# Patient Record
Sex: Female | Born: 1955
Health system: Southern US, Community
[De-identification: ages and names within clinical notes are randomized; demographics above are authoritative.]

## PROBLEM LIST (undated history)

## (undated) DIAGNOSIS — D721 Eosinophilia: Principal | ICD-10-CM

## (undated) DIAGNOSIS — I10 Essential (primary) hypertension: Secondary | ICD-10-CM

## (undated) DIAGNOSIS — T7840XA Allergy, unspecified, initial encounter: Secondary | ICD-10-CM

## (undated) DIAGNOSIS — K635 Polyp of colon: Secondary | ICD-10-CM

## (undated) DIAGNOSIS — E7800 Pure hypercholesterolemia, unspecified: Secondary | ICD-10-CM

## (undated) DIAGNOSIS — Z923 Personal history of irradiation: Secondary | ICD-10-CM

## (undated) DIAGNOSIS — E119 Type 2 diabetes mellitus without complications: Secondary | ICD-10-CM

## (undated) DIAGNOSIS — J301 Allergic rhinitis due to pollen: Secondary | ICD-10-CM

## (undated) DIAGNOSIS — G4730 Sleep apnea, unspecified: Secondary | ICD-10-CM

## (undated) DIAGNOSIS — E785 Hyperlipidemia, unspecified: Secondary | ICD-10-CM

## (undated) HISTORY — DX: Essential (primary) hypertension: I10

## (undated) HISTORY — DX: Personal history of irradiation: Z92.3

## (undated) HISTORY — DX: Pure hypercholesterolemia, unspecified: E78.00

## (undated) HISTORY — DX: Hyperlipidemia, unspecified: E78.5

## (undated) HISTORY — PX: TUBAL LIGATION: SHX77

## (undated) HISTORY — DX: Polyp of colon: K63.5

## (undated) HISTORY — DX: Type 2 diabetes mellitus without complications: E11.9

## (undated) HISTORY — DX: Allergic rhinitis due to pollen: J30.1

## (undated) HISTORY — DX: Eosinophilia: D72.1

## (undated) HISTORY — DX: Allergy, unspecified, initial encounter: T78.40XA

---

## 1997-06-12 ENCOUNTER — Ambulatory Visit (HOSPITAL_COMMUNITY): Admission: RE | Admit: 1997-06-12 | Discharge: 1997-06-12 | Payer: Self-pay | Admitting: Obstetrics and Gynecology

## 2000-11-06 ENCOUNTER — Emergency Department (HOSPITAL_COMMUNITY): Admission: EM | Admit: 2000-11-06 | Discharge: 2000-11-06 | Payer: Self-pay | Admitting: Internal Medicine

## 2007-10-17 ENCOUNTER — Emergency Department (HOSPITAL_COMMUNITY): Admission: EM | Admit: 2007-10-17 | Discharge: 2007-10-17 | Payer: Self-pay | Admitting: Emergency Medicine

## 2010-03-16 HISTORY — PX: COLONOSCOPY: SHX174

## 2012-01-11 ENCOUNTER — Ambulatory Visit (INDEPENDENT_AMBULATORY_CARE_PROVIDER_SITE_OTHER): Payer: BC Managed Care – PPO | Admitting: Family Medicine

## 2012-01-11 ENCOUNTER — Encounter: Payer: Self-pay | Admitting: Family Medicine

## 2012-01-11 VITALS — BP 124/80 | HR 64 | Temp 98.6°F | Ht 59.75 in | Wt 166.2 lb

## 2012-01-11 DIAGNOSIS — R635 Abnormal weight gain: Secondary | ICD-10-CM

## 2012-01-11 DIAGNOSIS — I10 Essential (primary) hypertension: Secondary | ICD-10-CM

## 2012-01-11 DIAGNOSIS — Z79899 Other long term (current) drug therapy: Secondary | ICD-10-CM

## 2012-01-11 DIAGNOSIS — E7800 Pure hypercholesterolemia, unspecified: Secondary | ICD-10-CM

## 2012-01-11 DIAGNOSIS — E785 Hyperlipidemia, unspecified: Secondary | ICD-10-CM

## 2012-01-11 MED ORDER — PRAVASTATIN SODIUM 40 MG PO TABS: 40.0000 mg | ORAL_TABLET | Freq: Every day | ORAL | Status: DC

## 2012-01-11 NOTE — Progress Notes (Signed)
Nature conservation officer at [facility] [address] [place] [address] Phone: [phone] Fax: [phone]  Date:  01/11/2012   Name:  Natalie Rice   DOB:  1955-06-20   MRN:  [id number] Gender: female Age: 56 y.o.  PCP:  Hannah Beat, MD  Evaluating MD: Hannah Beat, MD   Chief Complaint: Establish Care   History of Present Illness:  Natalie Rice is a 56 y.o. pleasant patient who presents with the following:  HTN: started on blood pressure medication last year, has been stable on Benicar HCT.  Last march, cholesterol was high, and she was taking some pravachol. She did discontinue this her self, when she had a cholesterol screening at work it was total of 323. She had no side effects from Pravachol.   Last month, had a screening at work, 323.   Sometimes thigh will feel like going to sleep.   Left-sided plantar fasciitis intermittently for one year. She is actually aren't seen podiatry, she has caused more ptotic, she has been doing some rehabilitation, and she also has had 2  Plantar fascia injections.  Patient Active Problem List  Diagnosis  . High blood pressure  . High cholesterol    Past Medical History  Diagnosis Date  . Hay fever   . High blood pressure   . High cholesterol   . Colon polyp     No past surgical history on file.  History  Substance Use Topics  . Smoking status: Former Games developer  . Smokeless tobacco: Never Used   Comment: quit 2007  . Alcohol Use: Yes     occassionally    No family history on file.  No Known Allergies  Current Outpatient Prescriptions on File Prior to Visit  Medication Sig Dispense Refill  . Loratadine (CLARITIN) 10 MG CAPS Take by mouth daily as needed.      Marland Kitchen olmesartan-hydrochlorothiazide (BENICAR HCT) 20-12.5 MG per tablet Take 1 tablet by mouth daily.      . pravastatin (PRAVACHOL) 40 MG tablet Take 1 tablet (40 mg total) by mouth daily.  30 tablet  3     Review of Systems:   GEN: No acute  illnesses, no fevers, chills. GI: No n/v/d, eating normally Pulm: No SOB Interactive and getting along well at home.  Otherwise, ROS is as per the HPI.   Physical Examination: Filed Vitals:   01/11/12 1401  BP: 124/80  Pulse: 64  Temp: 98.6 F (37 C)  TempSrc: Oral  Height: 4' 11.75" (1.518 m)  Weight: 166 lb 4 oz (75.411 kg)    Body mass index is 32.74 kg/(m^2). Ideal Body Weight: Weight in (lb) to have BMI = 25: 126.7    GEN: WDWN, NAD, Non-toxic, Alert & Oriented x 3 HEENT: Atraumatic, Normocephalic.  CV: regular rate and rhythm, no murmurs gallops or rubs. Pulmonary clear throughout bilaterally. Ears and Nose: No external deformity. EXTR: No clubbing/cyanosis/edema NEURO: Normal gait.  PSYCH: Normally interactive. Conversant. Not depressed or anxious appearing.  Calm demeanor.   Foot exam Echymosis: no Edema: no ROM: full LE B Gait: heel toe, non-antalgic MT pain: no Callus pattern: none Lateral Mall: NT Medial Mall: NT Talus: NT Navicular: NT Calcaneous: NT Metatarsals: NT 5th MT: NT Phalanges: NT Achilles: NT Plantar Fascia: tender, medial along PF. Pain with forced dorsi (LEFT) Fat Pad: NT Peroneals: NT Post Tib: NT Great Toe: Nml motion Ant Drawer: neg Other foot breakdown: none Long arch: preserved Transverse arch: preserved Hindfoot breakdown: none  Sensation: intact   Assessment and Plan:  1. Other and unspecified hyperlipidemia  Lipid panel  2. Hypertension    3. Encounter for long-term (current) use of other medications  CBC with Differential, Basic metabolic panel  4. Weight gain  TSH  5. High blood pressure    6. High cholesterol     New patient exam. Start Pravachol and recheck lipid panel in 2 months.  F/u CPX in 2 mo  Orders Today:  Orders Placed This Encounter  Procedures  . CBC with Differential    Standing Status: Future     Number of Occurrences:      Standing Expiration Date: 01/10/2013  . Basic metabolic panel     Standing Status: Future     Number of Occurrences:      Standing Expiration Date: 01/10/2013  . Lipid panel    Standing Status: Future     Number of Occurrences:      Standing Expiration Date: 01/10/2013  . TSH    Standing Status: Future     Number of Occurrences:      Standing Expiration Date: 01/10/2013    Updated Medication List: (Includes new medications, updates to list, dose adjustments) Meds ordered this encounter  Medications  . olmesartan-hydrochlorothiazide (BENICAR HCT) 20-12.5 MG per tablet    Sig: Take 1 tablet by mouth daily.  . Loratadine (CLARITIN) 10 MG CAPS    Sig: Take by mouth daily as needed.  Marland Kitchen aspirin 81 MG tablet    Sig: Take 81 mg by mouth daily as needed.  . pravastatin (PRAVACHOL) 40 MG tablet    Sig: Take 1 tablet (40 mg total) by mouth daily.    Dispense:  30 tablet    Refill:  3    Medications Discontinued: There are no discontinued medications.   Hannah Beat, MD

## 2012-01-11 NOTE — Patient Instructions (Addendum)
F/u 2 months for full CPX  Labs 1 week before

## 2012-01-12 ENCOUNTER — Encounter: Payer: Self-pay | Admitting: Family Medicine

## 2012-01-12 DIAGNOSIS — I152 Hypertension secondary to endocrine disorders: Secondary | ICD-10-CM | POA: Insufficient documentation

## 2012-01-12 DIAGNOSIS — I10 Essential (primary) hypertension: Secondary | ICD-10-CM | POA: Insufficient documentation

## 2012-01-12 DIAGNOSIS — E7800 Pure hypercholesterolemia, unspecified: Secondary | ICD-10-CM | POA: Insufficient documentation

## 2012-01-12 DIAGNOSIS — E785 Hyperlipidemia, unspecified: Secondary | ICD-10-CM | POA: Insufficient documentation

## 2012-01-12 DIAGNOSIS — E1169 Type 2 diabetes mellitus with other specified complication: Secondary | ICD-10-CM | POA: Insufficient documentation

## 2012-01-12 DIAGNOSIS — E1159 Type 2 diabetes mellitus with other circulatory complications: Secondary | ICD-10-CM | POA: Insufficient documentation

## 2012-01-29 ENCOUNTER — Other Ambulatory Visit: Payer: Self-pay | Admitting: *Deleted

## 2012-01-29 MED ORDER — PRAVASTATIN SODIUM 40 MG PO TABS: 40.0000 mg | ORAL_TABLET | Freq: Every day | ORAL | Status: DC

## 2012-01-29 MED ORDER — OLMESARTAN MEDOXOMIL-HCTZ 20-12.5 MG PO TABS: 1.0000 | ORAL_TABLET | Freq: Every day | ORAL | Status: DC

## 2012-02-22 ENCOUNTER — Other Ambulatory Visit: Payer: BC Managed Care – PPO

## 2012-02-25 ENCOUNTER — Other Ambulatory Visit (INDEPENDENT_AMBULATORY_CARE_PROVIDER_SITE_OTHER): Payer: BC Managed Care – PPO

## 2012-02-25 DIAGNOSIS — Z79899 Other long term (current) drug therapy: Secondary | ICD-10-CM

## 2012-02-25 DIAGNOSIS — E785 Hyperlipidemia, unspecified: Secondary | ICD-10-CM

## 2012-02-25 DIAGNOSIS — R635 Abnormal weight gain: Secondary | ICD-10-CM

## 2012-02-25 LAB — BASIC METABOLIC PANEL
BUN: 13 mg/dL (ref 6–23)
CO2: 28 mEq/L (ref 19–32)
Calcium: 9.3 mg/dL (ref 8.4–10.5)
Chloride: 101 mEq/L (ref 96–112)
Creatinine, Ser: 1 mg/dL (ref 0.4–1.2)
GFR: 73.87 mL/min (ref >60.00)
Glucose, Bld: 95 mg/dL (ref 70–99)
Potassium: 3.6 mEq/L (ref 3.5–5.1)
Sodium: 135 mEq/L (ref 135–145)

## 2012-02-25 LAB — CBC WITH DIFFERENTIAL/PLATELET
Basophils Absolute: 0.1 10*3/uL (ref 0.0–0.1)
Basophils Relative: 0.5 % (ref 0.0–3.0)
Eosinophils Absolute: 3.6 10*3/uL — ABNORMAL HIGH (ref 0.0–0.7)
Eosinophils Relative: 29.2 % — ABNORMAL HIGH (ref 0.0–5.0)
HCT: 44.1 % (ref 36.0–46.0)
Hemoglobin: 14.2 g/dL (ref 12.0–15.0)
Lymphocytes Relative: 19.8 % (ref 12.0–46.0)
Lymphs Abs: 2.4 10*3/uL (ref 0.7–4.0)
MCHC: 32.2 g/dL (ref 30.0–36.0)
MCV: 87.3 fl (ref 78.0–100.0)
Monocytes Absolute: 0.8 10*3/uL (ref 0.1–1.0)
Monocytes Relative: 6.8 % (ref 3.0–12.0)
Neutro Abs: 5.3 10*3/uL (ref 1.4–7.7)
Neutrophils Relative %: 43.7 % (ref 43.0–77.0)
Platelets: 295 10*3/uL (ref 150.0–400.0)
RBC: 5.05 Mil/uL (ref 3.87–5.11)
RDW: 13.6 % (ref 11.5–14.6)
WBC: 12.2 10*3/uL — ABNORMAL HIGH (ref 4.5–10.5)

## 2012-02-25 LAB — LIPID PANEL
Cholesterol: 193 mg/dL (ref 0–200)
HDL: 50.7 mg/dL (ref >39.00)
LDL Cholesterol: 126 mg/dL — ABNORMAL HIGH (ref 0–99)
Total CHOL/HDL Ratio: 4
Triglycerides: 82 mg/dL (ref 0.0–149.0)
VLDL: 16.4 mg/dL (ref 0.0–40.0)

## 2012-02-25 LAB — TSH: TSH: 1.9 u[IU]/mL (ref 0.35–5.50)

## 2012-02-29 ENCOUNTER — Ambulatory Visit (INDEPENDENT_AMBULATORY_CARE_PROVIDER_SITE_OTHER): Payer: BC Managed Care – PPO | Admitting: Family Medicine

## 2012-02-29 ENCOUNTER — Encounter: Payer: Self-pay | Admitting: Family Medicine

## 2012-02-29 VITALS — BP 130/70 | HR 78 | Temp 99.0°F | Ht 59.75 in | Wt 167.5 lb

## 2012-02-29 DIAGNOSIS — Z0000 Encounter for general adult medical examination without abnormal findings: Secondary | ICD-10-CM

## 2012-02-29 DIAGNOSIS — D721 Eosinophilia, unspecified: Secondary | ICD-10-CM

## 2012-02-29 DIAGNOSIS — Z01419 Encounter for gynecological examination (general) (routine) without abnormal findings: Secondary | ICD-10-CM

## 2012-02-29 NOTE — Progress Notes (Signed)
Nature conservation officer at [facility] [address] [place] [address] Phone: [phone] Fax: [phone]  Date:  02/29/2012   Name:  Natalie Rice   DOB:  13-Apr-1955   MRN:  [id number] Gender: female Age: 56 y.o.  PCP:  Hannah Beat, MD  Evaluating MD: Hannah Beat, MD   Chief Complaint: Annual Exam   History of Present Illness:  Natalie Rice is a 56 y.o. pleasant patient who presents with the following:  CPX:  Pap - last year, everything done last year. Can move to every 2 years.  Mammo - had one last year. [name].  Colon - 2012, everything was ok. 10 year recall.   HTN, stable  Lipids, stable Much improved - tol pravachol.  Health Maintenance Summary Reviewed and updated, unless pt declines services.  Tobacco History Reviewed. Non-smoker Alcohol: No concerns, no excessive use Exercise Habits: Some activity, rec at least 30 mins 5 times a week STD concerns: none Drug Use: None Birth control method: s/p menst Lumps or breast concerns: no Breast Cancer Family History: no  Health Maintenance  Topic Date Due  . Tetanus/tdap  07/26/1975  . Influenza Vaccine  11/14/2012  . Mammogram  02/28/2013  . Pap Smear  02/28/2013  . Colonoscopy  02/28/2021    Labs reviewed with the patient.  Results for orders placed in visit on 02/25/12  CBC WITH DIFFERENTIAL      Component Value Range   WBC 12.2 (*) 4.5 - 10.5 K/uL   RBC 5.05  3.87 - 5.11 Mil/uL   Hemoglobin 14.2  12.0 - 15.0 g/dL   HCT 96.2  95.2 - 84.1 %   MCV 87.3  78.0 - 100.0 fl   MCHC 32.2  30.0 - 36.0 g/dL   RDW 32.4  40.1 - 02.7 %   Platelets 295.0  150.0 - 400.0 K/uL   Neutrophils Relative    43.0 - 77.0 %   Value: 43.7 Manual smear review agrees with instrument differential.   Lymphocytes Relative 19.8  12.0 - 46.0 %   Monocytes Relative 6.8  3.0 - 12.0 %   Eosinophils Relative 29.2 Repeated and verified X2. (*) 0.0 - 5.0 %   Basophils Relative 0.5  0.0 - 3.0 %   Neutro  Abs 5.3  1.4 - 7.7 K/uL   Lymphs Abs 2.4  0.7 - 4.0 K/uL   Monocytes Absolute 0.8  0.1 - 1.0 K/uL   Eosinophils Absolute 3.6 (*) 0.0 - 0.7 K/uL   Basophils Absolute 0.1  0.0 - 0.1 K/uL  BASIC METABOLIC PANEL      Component Value Range   Sodium 135  135 - 145 mEq/L   Potassium 3.6  3.5 - 5.1 mEq/L   Chloride 101  96 - 112 mEq/L   CO2 28  19 - 32 mEq/L   Glucose, Bld 95  70 - 99 mg/dL   BUN 13  6 - 23 mg/dL   Creatinine, Ser 1.0  0.4 - 1.2 mg/dL   Calcium 9.3  8.4 - 25.3 mg/dL   GFR 66.44  >03.47 mL/min  LIPID PANEL      Component Value Range   Cholesterol 193  0 - 200 mg/dL   Triglycerides 42.5  0.0 - 149.0 mg/dL   HDL 95.63  >87.56 mg/dL   VLDL 43.3  0.0 - 29.5 mg/dL   LDL Cholesterol 188 (*) 0 - 99 mg/dL   Total CHOL/HDL Ratio 4    TSH  Component Value Range   TSH 1.90  0.35 - 5.50 uIU/mL      Patient Active Problem List  Diagnosis  . High blood pressure  . High cholesterol    Past Medical History  Diagnosis Date  . Hay fever   . High blood pressure   . High cholesterol   . Colon polyp     No past surgical history on file.  History  Substance Use Topics  . Smoking status: Former Games developer  . Smokeless tobacco: Never Used     Comment: quit 2007  . Alcohol Use: Yes     Comment: occassionally    No family history on file.  No Known Allergies  Medication list has been reviewed and updated.  Outpatient Prescriptions Prior to Visit  Medication Sig Dispense Refill  . aspirin 81 MG tablet Take 81 mg by mouth daily as needed.      . Loratadine (CLARITIN) 10 MG CAPS Take by mouth daily as needed.      Marland Kitchen olmesartan-hydrochlorothiazide (BENICAR HCT) 20-12.5 MG per tablet Take 1 tablet by mouth daily.  90 tablet  1  . pravastatin (PRAVACHOL) 40 MG tablet Take 1 tablet (40 mg total) by mouth daily.  90 tablet  1   Last reviewed on 02/29/2012  2:30 PM by Consuello Masse, CMA  Review of Systems:   General: Denies fever, chills, sweats. No significant  weight loss. Eyes: Denies blurring,significant itching ENT: Denies earache, sore throat, and hoarseness.  Cardiovascular: Denies chest pains, palpitations, dyspnea on exertion,  Respiratory: Denies cough, dyspnea at rest,wheeezing Breast: no concerns about lumps GI: Denies nausea, vomiting, diarrhea, constipation, change in bowel habits, abdominal pain, melena, hematochezia GU: Denies dysuria, hematuria, urinary hesitancy, nocturia, denies STD risk, no concerns about discharge Musculoskeletal: Denies back pain, joint pain Derm: Denies rash, itching Neuro: Denies  paresthesias, frequent falls, frequent headaches Psych: Denies depression, anxiety Endocrine: Denies cold intolerance, heat intolerance, polydipsia Heme: Denies enlarged lymph nodes Allergy: No hayfever   Physical Examination: Filed Vitals:   02/29/12 1420  BP: 130/70  Pulse: 78  Temp: 99 F (37.2 C)  TempSrc: Oral  Height: 4' 11.75" (1.518 m)  Weight: 167 lb 8 oz (75.978 kg)  SpO2: 97%    Body mass index is 32.99 kg/(m^2). Ideal Body Weight: Weight in (lb) to have BMI = 25: 126.7    Wt Readings from Last 3 Encounters:  02/29/12 167 lb 8 oz (75.978 kg)  01/11/12 166 lb 4 oz (75.411 kg)    GEN: well developed, well nourished, no acute distress Eyes: conjunctiva and lids normal, PERRLA, EOMI ENT: TM clear, nares clear, oral exam WNL Neck: supple, no lymphadenopathy, no thyromegaly, no JVD Pulm: clear to auscultation and percussion, respiratory effort normal CV: regular rate and rhythm, S1-S2, no murmur, rub or gallop, no bruits Chest: no scars, masses, no lumps BREAST: no lumps, no axillary LAD, no nipple discharge GI: soft, non-tender; no hepatosplenomegaly, masses; active bowel sounds all quadrants GU: defer Lymph: no cervical, axillary or inguinal adenopathy MSK: gait normal, muscle tone and strength WNL, no joint swelling, effusions, discoloration, crepitus  SKIN: clear, good turgor, color WNL, no rashes,  lesions, or ulcerations Neuro: normal mental status, normal strength, sensation, and motion Psych: alert; oriented to person, place and time, normally interactive and not anxious or depressed in appearance.   Assessment and Plan:  1. Routine gynecological examination    2. Eosinophilia  Pathologist smear review  3. Routine general medical examination at a  health care facility     Overall, doing well  No clear source for eosinophilia - no travel, no infections. Will check periph smear.  Orders Today:  Orders Placed This Encounter  Procedures  . Pathologist smear review    Updated Medication List: (Includes new medications, updates to list, dose adjustments) No orders of the defined types were placed in this encounter.    Medications Discontinued: There are no discontinued medications.   Hannah Beat, MD

## 2012-03-01 LAB — PATHOLOGIST SMEAR REVIEW

## 2012-03-03 ENCOUNTER — Other Ambulatory Visit: Payer: Self-pay | Admitting: Family Medicine

## 2012-03-03 ENCOUNTER — Encounter: Payer: Self-pay | Admitting: Family Medicine

## 2012-03-03 ENCOUNTER — Ambulatory Visit: Payer: Self-pay | Admitting: Internal Medicine

## 2012-03-03 DIAGNOSIS — D721 Eosinophilia, unspecified: Secondary | ICD-10-CM

## 2012-03-03 DIAGNOSIS — D72829 Elevated white blood cell count, unspecified: Secondary | ICD-10-CM

## 2012-03-03 HISTORY — DX: Eosinophilia, unspecified: D72.10

## 2012-03-03 NOTE — Progress Notes (Signed)
Marked eosinophilia with no travel exposure, no parasite risk, no meds to cause. Consult Heme/Onc -- lymphoma needs to be excluded in this case and full diagnostic work-up, which was fully discussed with the patient.  Pathologist smear review      Status: Final result   MyChart: Not Released   Next appt with me: None   Dx: Eosinophilia          Component  Value     Path Review       Comments:       Absolute eosinophilia the most common associations being allergic disorders, certain parasitic infections and Hodgkin's Disease.  Myeloid population consists predominantly of mature segmented neutrophils.  No immature cells are identified.  RBCs and platelets are unremarkable.   Reviewed by Nehemiah Massed Mammarappallil MD (Electronic Signature on File) 03/01/12    WBC  12.2 (H)  4.5 - 10.5 K/uL  RBC 5.05 3.87 - 5.11 Mil/uL  Hemoglobin 14.2 12.0 - 15.0 g/dL  HCT 46.9 62.9 - 52.8 %  MCV 87.3 78.0 - 100.0 fl  MCHC 32.2 30.0 - 36.0 g/dL  RDW 41.3 24.4 - 01.0 %  Platelets 295.0 150.0 - 400.0 K/uL  Neutrophils Relative 43.7 Manual smear review agrees with instrument differential. 43.0 - 77.0 %  Lymphocytes Relative 19.8 12.0 - 46.0 %  Monocytes Relative 6.8 3.0 - 12.0 %    Eosinophils Relative  29.2 Repeated and verified X2. (H)  Normal: 0.0 - 5.0 %    Basophils Relative 0.5 0.0 - 3.0 %  Neutro Abs 5.3 1.4 - 7.7 K/uL  Lymphs Abs 2.4 0.7 - 4.0 K/uL  Monocytes Absolute 0.8 0.1 - 1.0 K/uL    Eosinophils Absolute  3.6 (H)       Normal: 0.0 - 0.7 K/uL     Basophils Absolute 0.1

## 2012-03-23 ENCOUNTER — Ambulatory Visit: Payer: Self-pay | Admitting: Internal Medicine

## 2012-03-23 LAB — CBC CANCER CENTER
Comment - H1-Com1: NORMAL
Eosinophil: 6 %
HCT: 45.1 % (ref 35.0–47.0)
HGB: 14.9 g/dL (ref 12.0–16.0)
Lymphocytes: 21 %
MCH: 28.4 pg (ref 26.0–34.0)
MCHC: 33.1 g/dL (ref 32.0–36.0)
MCV: 86 fL (ref 80–100)
Monocytes: 4 %
Platelet: 345 x10 3/mm (ref 150–440)
RBC: 5.24 10*6/uL — ABNORMAL HIGH (ref 3.80–5.20)
RDW: 13.6 % (ref 11.5–14.5)
Segmented Neutrophils: 66 %
Variant Lymphocyte: 3 %
WBC: 9.3 x10 3/mm (ref 3.6–11.0)

## 2012-03-23 LAB — SEDIMENTATION RATE: Erythrocyte Sed Rate: 2 mm/hr (ref 0–30)

## 2012-03-23 LAB — URINALYSIS, COMPLETE
Bilirubin,UR: NEGATIVE
Glucose,UR: NEGATIVE mg/dL (ref 0–75)
Ketone: NEGATIVE
Leukocyte Esterase: NEGATIVE
Nitrite: NEGATIVE
Ph: 5 (ref 4.5–8.0)
Protein: NEGATIVE
RBC,UR: 1 /HPF (ref 0–5)
Specific Gravity: 1.006 (ref 1.003–1.030)
Squamous Epithelial: 1
WBC UR: 1 /HPF (ref 0–5)

## 2012-03-23 LAB — HEPATIC FUNCTION PANEL A (ARMC)
Albumin: 4.2 g/dL (ref 3.4–5.0)
Alkaline Phosphatase: 91 U/L (ref 50–136)
Bilirubin, Direct: 0.1 mg/dL (ref 0.00–0.20)
Bilirubin,Total: 0.4 mg/dL (ref 0.2–1.0)
SGOT(AST): 18 U/L (ref 15–37)
SGPT (ALT): 36 U/L (ref 12–78)
Total Protein: 8.3 g/dL — ABNORMAL HIGH (ref 6.4–8.2)

## 2012-03-23 LAB — FOLATE: Folic Acid: 18.3 ng/mL (ref 3.1–100.0)

## 2012-03-23 LAB — TSH: Thyroid Stimulating Horm: 2.32 u[IU]/mL

## 2012-03-23 LAB — IRON AND TIBC
Iron Bind.Cap.(Total): 333 ug/dL (ref 250–450)
Iron Saturation: 25 %
Iron: 84 ug/dL (ref 50–170)
Unbound Iron-Bind.Cap.: 249 ug/dL

## 2012-03-23 LAB — CREATININE, SERUM
Creatinine: 1.02 mg/dL (ref 0.60–1.30)
EGFR (African American): >60
EGFR (Non-African Amer.): >60

## 2012-03-24 LAB — PROT IMMUNOELECTROPHORES(ARMC)

## 2012-03-24 LAB — URINE CULTURE

## 2012-04-13 LAB — CBC CANCER CENTER
Basophil #: 0.1 x10 3/mm (ref 0.0–0.1)
Basophil %: 0.9 %
Eosinophil #: 0.3 x10 3/mm (ref 0.0–0.7)
Eosinophil %: 3.7 %
HCT: 43.4 % (ref 35.0–47.0)
HGB: 14.4 g/dL (ref 12.0–16.0)
Lymphocyte #: 2.7 x10 3/mm (ref 1.0–3.6)
Lymphocyte %: 29.3 %
MCH: 28.4 pg (ref 26.0–34.0)
MCHC: 33.2 g/dL (ref 32.0–36.0)
MCV: 86 fL (ref 80–100)
Monocyte #: 0.9 x10 3/mm (ref 0.2–0.9)
Monocyte %: 9.8 %
Neutrophil #: 5.2 x10 3/mm (ref 1.4–6.5)
Neutrophil %: 56.3 %
Platelet: 315 x10 3/mm (ref 150–440)
RBC: 5.07 10*6/uL (ref 3.80–5.20)
RDW: 13.5 % (ref 11.5–14.5)
WBC: 9.2 x10 3/mm (ref 3.6–11.0)

## 2012-04-16 ENCOUNTER — Ambulatory Visit: Payer: Self-pay | Admitting: Internal Medicine

## 2012-05-06 ENCOUNTER — Telehealth: Payer: Self-pay

## 2012-05-06 NOTE — Telephone Encounter (Signed)
Pt left v/m; Pt thinks Pravastatin is causing muscles to be sore and stiff; soreness and stiffness began several weeks ago. Pt started taking Pravastatin in Oct 2013. Pt said 1-2 hours after taking Pravastatin is when notices the stiffness and soreness in back, shoulders and arms. No injury noted.[employer].Please advise.

## 2012-05-07 NOTE — Telephone Encounter (Signed)
i would stop it and see if she feels better after a few weeks. If so, then it is probably the pravastatin.

## 2012-05-09 NOTE — Telephone Encounter (Signed)
Patient advised.

## 2012-08-12 ENCOUNTER — Other Ambulatory Visit: Payer: Self-pay | Admitting: Family Medicine

## 2012-09-10 ENCOUNTER — Emergency Department: Payer: Self-pay | Admitting: Emergency Medicine

## 2012-09-10 LAB — BASIC METABOLIC PANEL
Anion Gap: 6 — ABNORMAL LOW (ref 7–16)
BUN: 20 mg/dL — ABNORMAL HIGH (ref 7–18)
Calcium, Total: 9.7 mg/dL (ref 8.5–10.1)
Chloride: 104 mmol/L (ref 98–107)
Co2: 28 mmol/L (ref 21–32)
Creatinine: 0.98 mg/dL (ref 0.60–1.30)
EGFR (African American): >60
EGFR (Non-African Amer.): >60
Glucose: 89 mg/dL (ref 65–99)
Osmolality: 278 (ref 275–301)
Potassium: 3.7 mmol/L (ref 3.5–5.1)
Sodium: 138 mmol/L (ref 136–145)

## 2012-09-10 LAB — CBC WITH DIFFERENTIAL/PLATELET
Basophil #: 0.1 10*3/uL (ref 0.0–0.1)
Basophil %: 0.7 %
Eosinophil #: 0.4 10*3/uL (ref 0.0–0.7)
Eosinophil %: 4.6 %
HCT: 41.2 % (ref 35.0–47.0)
HGB: 13.6 g/dL (ref 12.0–16.0)
Lymphocyte #: 2.4 10*3/uL (ref 1.0–3.6)
Lymphocyte %: 31.8 %
MCH: 27.9 pg (ref 26.0–34.0)
MCHC: 32.9 g/dL (ref 32.0–36.0)
MCV: 85 fL (ref 80–100)
Monocyte #: 0.7 x10 3/mm (ref 0.2–0.9)
Monocyte %: 9.8 %
Neutrophil #: 4 10*3/uL (ref 1.4–6.5)
Neutrophil %: 53.1 %
Platelet: 278 10*3/uL (ref 150–440)
RBC: 4.86 10*6/uL (ref 3.80–5.20)
RDW: 13.6 % (ref 11.5–14.5)
WBC: 7.6 10*3/uL (ref 3.6–11.0)

## 2012-09-14 ENCOUNTER — Emergency Department: Payer: Self-pay | Admitting: Emergency Medicine

## 2012-09-15 ENCOUNTER — Encounter: Payer: Self-pay | Admitting: Family Medicine

## 2012-09-15 ENCOUNTER — Encounter (INDEPENDENT_AMBULATORY_CARE_PROVIDER_SITE_OTHER): Payer: BC Managed Care – PPO

## 2012-09-15 ENCOUNTER — Encounter: Payer: Self-pay | Admitting: *Deleted

## 2012-09-15 ENCOUNTER — Ambulatory Visit (INDEPENDENT_AMBULATORY_CARE_PROVIDER_SITE_OTHER): Payer: BC Managed Care – PPO | Admitting: Family Medicine

## 2012-09-15 VITALS — BP 120/72 | HR 69 | Temp 98.2°F | Ht 59.75 in | Wt 164.5 lb

## 2012-09-15 DIAGNOSIS — I82412 Acute embolism and thrombosis of left femoral vein: Secondary | ICD-10-CM | POA: Insufficient documentation

## 2012-09-15 DIAGNOSIS — M25571 Pain in right ankle and joints of right foot: Secondary | ICD-10-CM | POA: Insufficient documentation

## 2012-09-15 DIAGNOSIS — Z7901 Long term (current) use of anticoagulants: Secondary | ICD-10-CM

## 2012-09-15 DIAGNOSIS — M7989 Other specified soft tissue disorders: Secondary | ICD-10-CM

## 2012-09-15 DIAGNOSIS — M25579 Pain in unspecified ankle and joints of unspecified foot: Secondary | ICD-10-CM

## 2012-09-15 DIAGNOSIS — IMO0002 Reserved for concepts with insufficient information to code with codable children: Secondary | ICD-10-CM | POA: Insufficient documentation

## 2012-09-15 DIAGNOSIS — Z87448 Personal history of other diseases of urinary system: Secondary | ICD-10-CM

## 2012-09-15 DIAGNOSIS — I824Y9 Acute embolism and thrombosis of unspecified deep veins of unspecified proximal lower extremity: Secondary | ICD-10-CM

## 2012-09-15 DIAGNOSIS — M79609 Pain in unspecified limb: Secondary | ICD-10-CM

## 2012-09-15 DIAGNOSIS — R319 Hematuria, unspecified: Secondary | ICD-10-CM

## 2012-09-15 LAB — POCT URINALYSIS DIPSTICK
Bilirubin, UA: NEGATIVE
Glucose, UA: NEGATIVE
Ketones, UA: NEGATIVE
Leukocytes, UA: NEGATIVE
Nitrite, UA: NEGATIVE
Protein, UA: 3000
Spec Grav, UA: 1.02
Urobilinogen, UA: NEGATIVE
pH, UA: 6

## 2012-09-15 LAB — CBC
HCT: 42.6 % (ref 36.0–46.0)
Hemoglobin: 14 g/dL (ref 12.0–15.0)
MCHC: 32.9 g/dL (ref 30.0–36.0)
MCV: 87.9 fl (ref 78.0–100.0)
Platelets: 336 10*3/uL (ref 150.0–400.0)
RBC: 4.85 Mil/uL (ref 3.87–5.11)
RDW: 13.7 % (ref 11.5–14.6)
WBC: 9.3 10*3/uL (ref 4.5–10.5)

## 2012-09-15 LAB — URIC ACID: Uric Acid, Serum: 7.2 mg/dL — ABNORMAL HIGH (ref 2.4–7.0)

## 2012-09-15 MED ORDER — RIVAROXABAN 20 MG PO TABS: 20.0000 mg | ORAL_TABLET | Freq: Two times a day (BID) | ORAL | Status: DC

## 2012-09-15 MED ORDER — ENOXAPARIN SODIUM 100 MG/ML ~~LOC~~ SOLN: 1.5000 mg/kg | Freq: Every day | SUBCUTANEOUS | Status: DC

## 2012-09-15 MED ORDER — WARFARIN SODIUM 5 MG PO TABS: 5.0000 mg | ORAL_TABLET | Freq: Every day | ORAL | Status: DC

## 2012-09-15 NOTE — Assessment & Plan Note (Signed)
Concerning for second DVT  or possibly gout. Will reassess with dopplers and check uric acid level.

## 2012-09-15 NOTE — Progress Notes (Signed)
Subjective:    Patient ID: Natalie Rice, female    DOB: [birth year], 56 y.o.   MRN: [id number]  HPI  57 year old pt of Dr. Patsy Lager  With history of HTN, high cholesterol presents following recent ER visist for left leg swelling, pain after long trip home from [place], plane flight.  [place] in ER showed DVT in left common  femoral vein.  Nml labs. In ER she was started on xarelto 15 mg x 21 days. No SE to xarelto.   She also returned to ER last night with redness, tenderness, swelling in right ankle.. [place] was normal.  No injury. Red on medial ankle, very tender to touch, warm. No labs done.  Father with history of gout. Ate very rich foods on trip. Given a brace.   Mother with DVT when with cancer at that time age 53. No family history of clotting disease. Nonsmoker. On no OCPs, estrogen.  No SOB, no chest pain, pain and swelling improving on left. New onset this  morning urine reddish/brown.  No dysuria. Gums have been bleeding some off and on.  Review of Systems  Constitutional: Negative for fever, fatigue and unexpected weight change.  HENT: Negative for ear pain, congestion, sore throat, sneezing, trouble swallowing and sinus pressure.   Eyes: Negative for pain and itching.  Respiratory: Negative for cough, shortness of breath and wheezing.   Cardiovascular: Positive for leg swelling. Negative for chest pain and palpitations.  Gastrointestinal: Negative for nausea, abdominal pain, diarrhea, constipation and blood in stool.  Genitourinary: Positive for hematuria. Negative for dysuria, vaginal bleeding, vaginal discharge, difficulty urinating, vaginal pain and menstrual problem.  Skin: Negative for rash.  Neurological: Negative for syncope, weakness, light-headedness, numbness and headaches.  Psychiatric/Behavioral: Negative for confusion and dysphoric mood. The patient is not nervous/anxious.        Objective:   Physical Exam  Constitutional: Vital signs are normal. She  appears well-developed and well-nourished. She is cooperative.  Non-toxic appearance. She does not appear ill. No distress.  HENT:  Head: Normocephalic.  Right Ear: Hearing, tympanic membrane, external ear and ear canal normal.  Left Ear: Hearing, tympanic membrane, external ear and ear canal normal.  Nose: Nose normal.  Eyes: Conjunctivae, EOM and lids are normal. Pupils are equal, round, and reactive to light. No foreign bodies found.  Neck: Trachea normal and normal range of motion. Neck supple. Carotid bruit is not present. No mass and no thyromegaly present.  Cardiovascular: Normal rate, regular rhythm, S1 normal, S2 normal, normal heart sounds and intact distal pulses.  Exam reveals no gallop.   No murmur heard. 1 plus non pitting edema in B ankles  Pulmonary/Chest: Effort normal and breath sounds normal. No respiratory distress. She has no wheezes. She has no rhonchi. She has no rales.  Abdominal: Soft. Normal appearance and bowel sounds are normal. She exhibits no distension, no fluid wave, no abdominal bruit and no mass. There is no hepatosplenomegaly. There is no tenderness. There is no rebound, no guarding and no CVA tenderness. No hernia.  Lymphadenopathy:    She has no cervical adenopathy.    She has no axillary adenopathy.  Neurological: She is alert. She has normal strength. No cranial nerve deficit or sensory deficit.  Skin: Skin is warm, dry and intact. No rash noted.  Right medial ankle red, very tender over malleolus  Psychiatric: Her speech is normal and behavior is normal. Judgment normal. Her mood appears not anxious. Cognition and memory are normal. She  does not exhibit a depressed mood.          Assessment & Plan:

## 2012-09-15 NOTE — Assessment & Plan Note (Addendum)
Discussed with Dr. Welton Flakes Hemetologist.  Stop xarelto.. Short half life. Should be out of pt system in 24 hours.  No method to reverse this medication.  Pt instructed if bleeding worsening to go to ER.  Will wait until tomorrow to initiate lovenox and coumadin.  Total visit time 40 minutes, > 50% spent counseling and cordinating patients care.

## 2012-09-15 NOTE — Patient Instructions (Addendum)
Stop at front desk for re-evaluation of dopplers bilaterally.  Stop xarelto now. Start lovenox injections once daily starting tommorow AM Start coumadin 5 mg daily tommorow AM.  Stop at lab on your way out. Make appt in Saturday clinic at [facility] for evaluation and INR check. Follow up appt with Dr., Wendi Maya next week on Mon or Tuesday for eval and INR check.

## 2012-09-15 NOTE — Assessment & Plan Note (Addendum)
Dr. Welton Flakes hemetologist concerned about skipping initial treatment with lovenox... Recommend reassessment of clot in left leg with dopplers. Will change to lovenox bridge to coumadin.  Given clear cause of clot... Will plans 3 months of anticoagulation, then stop.  Will have pt seen in Saturday clinic for INR/PT and re-eval of B legs for improvement and to make sure bleeding has resolved. Following that she will return early next week to see PCP.

## 2012-09-15 NOTE — Assessment & Plan Note (Addendum)
New onset secondary to xarelto.

## 2012-09-17 ENCOUNTER — Ambulatory Visit (INDEPENDENT_AMBULATORY_CARE_PROVIDER_SITE_OTHER): Payer: BC Managed Care – PPO | Admitting: Family Medicine

## 2012-09-17 ENCOUNTER — Encounter: Payer: Self-pay | Admitting: Family Medicine

## 2012-09-17 VITALS — BP 120/80 | HR 68 | Temp 97.3°F | Wt 165.0 lb

## 2012-09-17 DIAGNOSIS — I824Y9 Acute embolism and thrombosis of unspecified deep veins of unspecified proximal lower extremity: Secondary | ICD-10-CM

## 2012-09-17 DIAGNOSIS — I82412 Acute embolism and thrombosis of left femoral vein: Secondary | ICD-10-CM

## 2012-09-17 LAB — POCT INR: INR: 1.2

## 2012-09-17 NOTE — Progress Notes (Signed)
  Subjective:    Patient ID: Natalie Rice, female    DOB: 1956/01/01, 57 y.o.   MRN: [id number]  HPI Pt here f/u dvt.  Pt was on xaralto and was seen in pcp office Thursday for blood in urine.  Pt states blood stopped day they stopped xaralto .  Pt is now on lovenox and coumadin.  She started it yesterday.  No new complaints.     Review of Systems    as above Objective:   Physical Exam BP 120/80  Pulse 68  Temp(Src) 97.3 F (36.3 C) (Oral)  Wt 165 lb (74.844 kg)  BMI 32.48 kg/m2 General appearance: alert, cooperative, appears stated age and no distress Extremities: extremities normal, atraumatic, no cyanosis or edema       Assessment & Plan:

## 2012-09-17 NOTE — Assessment & Plan Note (Addendum)
Cont' coumadin and lovenox INR 1.2 today F/u Monday

## 2012-09-17 NOTE — Patient Instructions (Signed)
Warfarin, Questions and Answers  Warfarin (Coumadin) is a prescription medicine that delays normal blood clotting. This is called an "anticoagulant" or "blood thinner." This medicine does not actually cause the blood to become less thick, only less likely to clot.  Normally, when body tissues are cut or damaged, the blood clots in order to prevent blood loss. Some clots form when they are not supposed to and may travel through the bloodstream and become lodged in smaller blood vessels. Blockage of blood flow can cause serious problems including a stroke (when a blood vessel leading to a portion of the brain is blocked) or a pulmonary embolus (where a blood clot in the leg travels to and lodges in the pulmonary arteries, causing blockage of blood flow to the lungs).  WHO SHOULD USE WARFARIN?   Warfarin is prescribed for individuals who have a risk for developing harmful blood clots. People at risk include individuals with a mechanical heart valve, individuals with the irregular heart rhythm called atrial fibrillation, and individuals with certain clotting disorders.   Warfarin is also used in individuals who have a history of developing harmful clots, including individuals who have had a stroke, a clot which has traveled to the lung (pulmonary embolism), or a blood clot in the leg (deep venous thrombosis, DVT).   Warfarin may be used to prevent the growth of an existing clot, such as a DVT.  HOW IS THE EFFECT OF WARFARIN MONITORED?  The goal of warfarin therapy is to lessen the clotting tendency of blood, but not to prevent clotting completely. Therefore, the anticoagulation effect of warfarin must be carefully watched using laboratory blood tests.   The test most commonly used to measure the effects of warfarin is the prothrombin time (protime, PT).   The PT is also used to compute a value known as the International Normalized Ratio (INR).    The longer it takes the blood to clot, the higher the PT and INR. Your caregiver will tell you what your "target" INR range is. In most cases, the target range will be 2 to 3, although other ranges may be chosen. If, at any time, your INR is below the target range, there is a risk of clotting. If your INR is above the target range, there is an increased risk of bleeding.  When warfarin is first prescribed, a higher "loading" dose may be given so that an effective blood level of the medicine is reached quickly. The loading dose is then adjusted downward until a maintenance dose (a dose which is enough to keep the INR where it should be) is reached. Once you are on a stable maintenance dose, the PT and INR are watched less often, usually once every 2 to 4 weeks. The warfarin dose may be changed at times in response to a changing INR or to medical changes that call for an increase or decrease in warfarin therapy. It is important to keep all laboratory and caregiver follow-up appointments. Not keeping appointments could result in a chronic or permanent injury, pain, disability, and even death.  WHAT ARE THE SIDE EFFECTS OF WARFARIN?   Excessive blood thinning can cause bleeding (hemorrhage) from any part of the body. Individuals on warfarin should report any falls or accidents, or any symptoms of bleeding or unusual bruising. Signs of unusual bleeding may include bleeding from the gums, blood in the urine, bloody or dark stool, a nosebleed, coughing up blood, or vomiting blood. Because the risk of bleeding increases as the INR   rises above the desired limit, the INR is closely watched during warfarin therapy. Adjustments are made as needed to keep the INR at an acceptable level (within the target range).   Too little warfarin continues to allow the risk for blood clots.    Other body systems can be affected by warfarin. In addition to any signs of bleeding, patients should report skin rash or irritation, unusual fever, continual nausea or stomach upset, severe pain in the joints or back, swelling or pain at an injection site, new and severe headaches, or symptoms of a stroke (new weaknesses, change in mental status, visual changes, new dizziness, trouble speaking).   IS WARFARIN SAFE TO USE DURING PREGNANCY?   Warfarin is generally not advised during pregnancy, at least during the first trimester, due to an increased risk of birth defects. A woman who becomes pregnant or plans to become pregnant while on warfarin therapy should notify the caregiver immediately.   Although warfarin does not pass into breast milk, a woman who wishes to breastfeed while taking warfarin should also consult with her caregiver.  ARE THERE SPECIAL PRECAUTIONS TO FOLLOW WHILE TAKING WARFARIN?  Follow the dosage schedule carefully. Warfarin should be taken exactly as directed. A missed or extra dose requires a call to the prescribing caregiver for advice. Do not change the dose of warfarin on your own to make up for missed or extra doses. It is very important to take warfarin as directed since bleeding or blood clots could result in chronic or permanent injury, pain, or disability.  Warfarin tablets come in different strengths. Each tablet strength is usually a different color, with the amount of warfarin (in milligrams) clearly printed on the tablet. You should immediately report to the pharmacist or caregiver any prescription which is different than the previous one, to make sure that you are taking the correct dose.  Avoid situations that cause bleeding. You may have a tendency to bleed more easily than usual while taking warfarin. The following changes can limit bleeding:   Using a softer toothbrush.   Flossing with waxed floss rather than unwaxed floss.    Shaving with an electric razor rather than a blade.   Limiting the use of sharp objects.   Avoiding potentially harmful activities (such as contact sports).  Medical conditions. Medical conditions which increase your clotting time include:   Diarrhea.   Worsening of heart failure.   Fever.   Worsening of liver function.  Alcohol use. Alcohol can change the body's ability to handle warfarin. It is best to avoid alcoholic drinks or consume only very small amounts while taking warfarin. Notify your caregiver if you change your alcohol intake.  Other medicines. Many medicines can interfere with warfarin and affect the PT and INR results. Some of the most common over-the-counter pain relievers (acetaminophen, aspirin, and nonsteroidal anti-inflammatory medicines) make the anticoagulant effects of warfarin stronger. If these medicines are taken, your caregiver may need to adjust the dose of warfarin. The use of many antibiotics can increase the effects of warfarin, and therefore increase bleeding risk. Do not take or discontinue any prescribed or over-the-counter medicine except on the advice of your caregiver or pharmacist.   Dietary considerations. Do not drink herbal teas containing coumarin while taking this medicine. Many foods, especially foods high in vitamin K can interfere with warfarin and affect the PT and INR results. Foods high in vitamin K can cause warfarin to be less effective. You should eat a consistent amount of foods high   in vitamin K. The serving size for foods high in vitamin K are  cup cooked and 1 cup raw, unless otherwise noted. These foods include:   Beef liver (3.5 ounces).   Garbanzo beans.   Kale.   Spinach.   Nettle greens.   Swiss chard.   Pork liver (3.5 ounces).   Broccoli.   Cabbage.   Watercress.   Soybeans.   Endive.   Green tea (made with  ounce dried tea).   Brussels sprouts.   Cauliflower.   Parsley (1 Tablespoon).   Collard greens.    Seaweed (limit 2 sheets).   Turnip greens.   Soybean oil.   Green peas.  It is not necessary to avoid these foods, but avoid major changes in your diet, or notify your caregiver before changing your diet. Changing your diet to include less foods containing vitamin K may lead to an excessive warfarin effect. Arrange a visit with a dietitian to answer your questions.   IS IT SAFE TO USE SUPPLEMENTS WHILE TAKING WARFARIN?   Vitamin E. Vitamin E may increase the anticoagulant effects of warfarin. You should consult your caregiver before adding or changing a dose of vitamin E or any other vitamin.   Check other supplements such as multivitamins and calcium supplements. These may contain vitamin K.  Document Released: 03/02/2005 Document Revised: 11/25/2011 Document Reviewed: 08/11/2011  [employer] Patient Information 2014 [employer].

## 2012-09-19 ENCOUNTER — Encounter: Payer: Self-pay | Admitting: Family Medicine

## 2012-09-19 ENCOUNTER — Ambulatory Visit (INDEPENDENT_AMBULATORY_CARE_PROVIDER_SITE_OTHER): Payer: BC Managed Care – PPO | Admitting: Family Medicine

## 2012-09-19 ENCOUNTER — Telehealth: Payer: Self-pay | Admitting: Family Medicine

## 2012-09-19 ENCOUNTER — Other Ambulatory Visit: Payer: Self-pay | Admitting: Family Medicine

## 2012-09-19 VITALS — BP 120/70 | HR 72 | Temp 98.2°F | Ht 59.75 in | Wt 164.5 lb

## 2012-09-19 DIAGNOSIS — M109 Gout, unspecified: Secondary | ICD-10-CM

## 2012-09-19 DIAGNOSIS — I824Y9 Acute embolism and thrombosis of unspecified deep veins of unspecified proximal lower extremity: Secondary | ICD-10-CM

## 2012-09-19 DIAGNOSIS — I82412 Acute embolism and thrombosis of left femoral vein: Secondary | ICD-10-CM

## 2012-09-19 LAB — PROTIME-INR
INR: 1.6 ratio — ABNORMAL HIGH (ref 0.8–1.0)
Prothrombin Time: 16.3 s — ABNORMAL HIGH (ref 10.2–12.4)

## 2012-09-19 MED ORDER — ENOXAPARIN SODIUM 120 MG/0.8ML ~~LOC~~ SOLN: 120.0000 mg | Freq: Every day | SUBCUTANEOUS | Status: DC

## 2012-09-19 NOTE — Patient Instructions (Addendum)
Come back to Coumadin clinic on Thursday.

## 2012-09-19 NOTE — Telephone Encounter (Signed)
Informed pt of INR result from today (1.6).  Instructed pt to continue Lovenox until her appt on 7/10.  Additonal Lovenox called in to [facility].   Pt verbalized understanding.

## 2012-09-19 NOTE — Progress Notes (Signed)
Nature conservation officer at [facility] [address] [place] [address] Phone: [phone] Fax: [phone]  Date:  09/19/2012   Name:  Natalie Rice   DOB:  1955/08/08   MRN:  [id number] Gender: female Age: 57 y.o.  Primary Physician:  Hannah Beat, MD  Evaluating MD: Hannah Beat, MD   Chief Complaint: Follow-up   History of Present Illness:  Natalie Rice is a 57 y.o. pleasant patient who presents with the following:  F/u DVT:  L subacute DVT, distal femoral vein on the left extending to the popliteal, gastrocnemius, PTV, and peroneal veins.  From 09/16/2012 u/s at [facility] vascular lab.  She continues to have some discomfort in both the right and left. The left is not bothering her that bad currently. She is able to do her Lovenox injections without any difficulty and she is currently taking Coumadin 5 mg a day. I helped to arrange a followup appointment in our Coumadin clinic for Thursday.  R ankle.  ? Gout. The patient was having bilateral ankle pain without any sort of trauma. Now, she is having the greatest pain in the medial posterior aspect of the right foot, however this has been improving over the last 1-2 days. She is most tender around the region of the peroneal tendon, but appears to be more deep and slightly medial to the typical placement of his tendon.  Uric acid has returned at 7.2.  09/15/2012 OV 57 year old pt of Dr. Patsy Lager  With history of HTN, high cholesterol presents following recent ER visist for left leg swelling, pain after long trip home from [place], plane flight.  [place] in ER showed DVT in left common  femoral vein.  Nml labs. In ER she was started on xarelto 15 mg x 21 days. No SE to xarelto.   She also returned to ER last night with redness, tenderness, swelling in right ankle.. [place] was normal.  No injury. Red on medial ankle, very tender to touch, warm. No labs done.  Father with history of gout. Ate very rich foods on  trip. Given a brace.   Mother with DVT when with cancer at that time age 36. No family history of clotting disease. Nonsmoker. On no OCPs, estrogen.  No SOB, no chest pain, pain and swelling improving on left. New onset this  morning urine reddish/brown.  No dysuria. Gums have been bleeding some off and on.   Patient Active Problem List   Diagnosis Date Noted  . Left femoral vein DVT 09/15/2012  . Right ankle pain 09/15/2012  . Hematuria 09/15/2012  . Excessive anticoagulation 09/15/2012  . Eosinophilia 03/03/2012  . High blood pressure   . High cholesterol     Past Medical History  Diagnosis Date  . Hay fever   . High blood pressure   . High cholesterol   . Colon polyp   . Eosinophilia 03/03/2012    No past surgical history on file.  History   Social History  . Marital Status: Married    Spouse Name: N/A    Number of Children: N/A  . Years of Education: N/A   Occupational History  .      [employer]   Social History Main Topics  . Smoking status: Former Games developer  . Smokeless tobacco: Never Used     Comment: quit 2007  . Alcohol Use: Yes     Comment: occassionally  . Drug Use: No  . Sexually Active: Not on  file   Other Topics Concern  . Not on file   Social History Narrative  . No narrative on file    No family history on file.  No Known Allergies  Medication list has been reviewed and updated.  Outpatient Prescriptions Prior to Visit  Medication Sig Dispense Refill  . BENICAR HCT 20-12.5 MG per tablet TAKE 1 BY MOUTH DAILY  90 tablet  0  . warfarin (COUMADIN) 5 MG tablet Take 1 tablet (5 mg total) by mouth daily.  30 tablet  0  . Loratadine (CLARITIN) 10 MG CAPS Take by mouth daily as needed.      . enoxaparin (LOVENOX) 100 MG/ML injection Inject 1.1 mLs (110 mg total) into the skin daily.  5 mL  1   No facility-administered medications prior to visit.    Review of Systems:  Multiple complaints as above. No fever, no chest  pain. No shortness of breath. Pulse ox is 98. No headaches.  Physical Examination: BP 120/70  Pulse 72  Temp(Src) 98.2 F (36.8 C) (Oral)  Ht 4' 11.75" (1.518 m)  Wt 164 lb 8 oz (74.617 kg)  BMI 32.38 kg/m2  SpO2 98%  Ideal Body Weight: Weight in (lb) to have BMI = 25: 126.7   GEN: WDWN, NAD, Non-toxic, Alert & Oriented x 3 HEENT: Atraumatic, Normocephalic.  Ears and Nose: No external deformity. EXTR: No clubbing/cyanosis/edema NEURO: Normal gait.  PSYCH: Normally interactive. Conversant. Not depressed or anxious appearing.  Calm demeanor.   Right ankle moves well and has felt normal motion. Entire forefoot and midfoot are nontender. Ankles stable with a normal anterior drawer testing. Medial lateral malleoli are nontender. ATFL and CFL are nontender. Mild tenderness posteriorly in the posterior ankle but not on the area of the Achilles tendon.  Left lower extremity and nontender grossly to palpation.  Assessment and Plan:  Left femoral vein DVT - Plan: Protime-INR  Gout  >25 minutes spent in face to face time with patient, >50% spent in counselling or coordination of care: help arrange a visit with our Coumadin clinic RN at the same time as my office visit, and she is go followup with the patient on Thursday. The patient's INR has returned at 1.6, so we increased her length of time for Lovenox shots.  At this point, not to do anything about what appears to be a resolving gout flare  Orders Today:  Orders Placed This Encounter  Procedures  . Protime-INR    Updated Medication List: (Includes new medications, updates to list, dose adjustments) Meds ordered this encounter  Medications  . DISCONTD: enoxaparin (LOVENOX) 120 MG/0.8ML injection    Sig: Inject 0.8 mLs into the skin daily.    Medications Discontinued: Medications Discontinued During This Encounter  Medication Reason  . enoxaparin (LOVENOX) 100 MG/ML injection Error      Signed, Josuel Koeppen T. Lillyian Heidt,  MD 09/19/2012 12:03 PM

## 2012-09-20 NOTE — Addendum Note (Signed)
Addended by: Criselda Peaches B on: 09/20/2012 04:05 PM   Modules accepted: Orders

## 2012-09-22 ENCOUNTER — Ambulatory Visit (INDEPENDENT_AMBULATORY_CARE_PROVIDER_SITE_OTHER): Payer: BC Managed Care – PPO | Admitting: Family Medicine

## 2012-09-22 DIAGNOSIS — I824Y9 Acute embolism and thrombosis of unspecified deep veins of unspecified proximal lower extremity: Secondary | ICD-10-CM

## 2012-09-22 DIAGNOSIS — I82412 Acute embolism and thrombosis of left femoral vein: Secondary | ICD-10-CM

## 2012-09-22 LAB — POCT INR: INR: 2.2

## 2012-09-29 ENCOUNTER — Ambulatory Visit (INDEPENDENT_AMBULATORY_CARE_PROVIDER_SITE_OTHER): Payer: BC Managed Care – PPO | Admitting: Family Medicine

## 2012-09-29 DIAGNOSIS — I824Y9 Acute embolism and thrombosis of unspecified deep veins of unspecified proximal lower extremity: Secondary | ICD-10-CM

## 2012-09-29 DIAGNOSIS — I82412 Acute embolism and thrombosis of left femoral vein: Secondary | ICD-10-CM

## 2012-09-29 LAB — POCT INR: INR: 5.2

## 2012-10-05 ENCOUNTER — Encounter: Payer: Self-pay | Admitting: Family Medicine

## 2012-10-06 ENCOUNTER — Ambulatory Visit (INDEPENDENT_AMBULATORY_CARE_PROVIDER_SITE_OTHER): Payer: BC Managed Care – PPO | Admitting: Family Medicine

## 2012-10-06 DIAGNOSIS — I824Y9 Acute embolism and thrombosis of unspecified deep veins of unspecified proximal lower extremity: Secondary | ICD-10-CM

## 2012-10-06 DIAGNOSIS — I82412 Acute embolism and thrombosis of left femoral vein: Secondary | ICD-10-CM

## 2012-10-06 LAB — POCT INR: INR: 3.8

## 2012-10-12 ENCOUNTER — Other Ambulatory Visit: Payer: Self-pay | Admitting: Family Medicine

## 2012-10-24 ENCOUNTER — Ambulatory Visit (INDEPENDENT_AMBULATORY_CARE_PROVIDER_SITE_OTHER): Payer: BC Managed Care – PPO | Admitting: Family Medicine

## 2012-10-24 DIAGNOSIS — I824Y9 Acute embolism and thrombosis of unspecified deep veins of unspecified proximal lower extremity: Secondary | ICD-10-CM

## 2012-10-24 DIAGNOSIS — I82412 Acute embolism and thrombosis of left femoral vein: Secondary | ICD-10-CM

## 2012-10-24 LAB — POCT INR: INR: 3.1

## 2012-11-15 ENCOUNTER — Other Ambulatory Visit: Payer: Self-pay | Admitting: *Deleted

## 2012-11-15 MED ORDER — OLMESARTAN MEDOXOMIL-HCTZ 20-12.5 MG PO TABS: ORAL_TABLET | ORAL | Status: DC

## 2012-11-16 NOTE — Telephone Encounter (Addendum)
Pt called to ck status of benicar hct refill; advised done and pt will ck with Prim[facility].

## 2012-11-21 ENCOUNTER — Ambulatory Visit (INDEPENDENT_AMBULATORY_CARE_PROVIDER_SITE_OTHER): Payer: BC Managed Care – PPO | Admitting: Family Medicine

## 2012-11-21 ENCOUNTER — Telehealth: Payer: Self-pay | Admitting: Family Medicine

## 2012-11-21 DIAGNOSIS — I824Y9 Acute embolism and thrombosis of unspecified deep veins of unspecified proximal lower extremity: Secondary | ICD-10-CM

## 2012-11-21 DIAGNOSIS — I82412 Acute embolism and thrombosis of left femoral vein: Secondary | ICD-10-CM

## 2012-11-21 LAB — POCT INR: INR: 2.9

## 2012-11-21 NOTE — Telephone Encounter (Signed)
Pt would like to know what plans are for d/c'ing Coumadin.  She was told initially that she would be taking it for 3 months and started on 09/22/12.  INR today 2.9.  Please advise.

## 2012-11-21 NOTE — Telephone Encounter (Signed)
3 months of anticoagulation should be adequate.  Plan on through 12/23/2012.

## 2012-11-22 NOTE — Telephone Encounter (Signed)
Does pt need U/S?

## 2012-11-22 NOTE — Telephone Encounter (Signed)
Discussed and entire case reviewed. F/u B U/S reviewed, isolated to one side. Dr. B discussed case with Dr. Welton Flakes from hematology, who recommended 3 months of anticoagulation, so I would defer to her expertise.   Follow-up ultrasounds are generally not needed and should not be in this case.   Hannah Beat, MD 11/22/2012, 11:51 AM

## 2012-12-19 ENCOUNTER — Telehealth: Payer: Self-pay | Admitting: Family Medicine

## 2012-12-19 ENCOUNTER — Ambulatory Visit (INDEPENDENT_AMBULATORY_CARE_PROVIDER_SITE_OTHER): Payer: BC Managed Care – PPO | Admitting: Family Medicine

## 2012-12-19 DIAGNOSIS — I824Y9 Acute embolism and thrombosis of unspecified deep veins of unspecified proximal lower extremity: Secondary | ICD-10-CM

## 2012-12-19 DIAGNOSIS — Z23 Encounter for immunization: Secondary | ICD-10-CM

## 2012-12-19 DIAGNOSIS — I82412 Acute embolism and thrombosis of left femoral vein: Secondary | ICD-10-CM

## 2012-12-19 LAB — POCT INR: INR: 2.5

## 2012-12-19 NOTE — Telephone Encounter (Signed)
Appointment scheduled with Dr. Patsy Lager 12/26/2012

## 2012-12-19 NOTE — Telephone Encounter (Signed)
Pt says she has been having numbness in her left thigh at night.  She says she experienced this when she was having an issue with plantar fasciitis last year and that it has come back.  Please advise.

## 2012-12-19 NOTE — Telephone Encounter (Signed)
She can f/u with me in the next couple of weeks.

## 2012-12-19 NOTE — Telephone Encounter (Signed)
Left message on voicemail to call the office and schedule appointment with Dr. Patsy Lager.

## 2012-12-26 ENCOUNTER — Encounter: Payer: Self-pay | Admitting: Family Medicine

## 2012-12-26 ENCOUNTER — Ambulatory Visit (INDEPENDENT_AMBULATORY_CARE_PROVIDER_SITE_OTHER): Payer: BC Managed Care – PPO | Admitting: Family Medicine

## 2012-12-26 VITALS — BP 112/74 | HR 75 | Temp 98.3°F | Ht 59.75 in | Wt 171.5 lb

## 2012-12-26 DIAGNOSIS — I824Y9 Acute embolism and thrombosis of unspecified deep veins of unspecified proximal lower extremity: Secondary | ICD-10-CM

## 2012-12-26 DIAGNOSIS — I82412 Acute embolism and thrombosis of left femoral vein: Secondary | ICD-10-CM

## 2012-12-26 DIAGNOSIS — I10 Essential (primary) hypertension: Secondary | ICD-10-CM

## 2012-12-26 MED ORDER — HYDROCHLOROTHIAZIDE 12.5 MG PO TABS: 12.5000 mg | ORAL_TABLET | Freq: Every day | ORAL | Status: DC

## 2012-12-26 NOTE — Patient Instructions (Signed)
F/u 2 weeks for Hypercoag work-up bloodwork

## 2012-12-26 NOTE — Progress Notes (Signed)
Date:  12/26/2012   Name:  Natalie Rice   DOB:  13-Jun-1955   MRN:  [id number] Gender: female Age: 57 y.o.  Primary Physician:  Hannah Beat, MD   Chief Complaint: Follow-up   History of Present Illness:  Natalie Rice is a 57 y.o. pleasant patient who presents with the following:  Off of coumadin. The patient is primarily here to followup for hypercoagulable labs with just coming off of Coumadin. Unfortunately the followup timeframe was not ideal. She cannot have the blood work today. She has been off Coumadin for only one week.  Has gained about 6 pounds.  HTN, stop the benicar, go to pure hctz. Her hypertension has been running relatively low for some time, and it is 112/74 today. She is interested in decreasing the amount of blood pressure medications she is on.  Historically, had some PF. This is ongoing very mildly. She is also having some pain in the posterior of her leg intermittently.  Patient Active Problem List   Diagnosis Date Noted  . Gout 09/19/2012  . Left femoral vein DVT 09/15/2012  . Eosinophilia 03/03/2012  . High blood pressure   . High cholesterol     Past Medical History  Diagnosis Date  . Hay fever   . High blood pressure   . High cholesterol   . Colon polyp   . Eosinophilia 03/03/2012    No past surgical history on file.  History   Social History  . Marital Status: Married    Spouse Name: N/A    Number of Children: N/A  . Years of Education: N/A   Occupational History  .      [employer]   Social History Main Topics  . Smoking status: Former Games developer  . Smokeless tobacco: Never Used     Comment: quit 2007  . Alcohol Use: Yes     Comment: occassionally  . Drug Use: No  . Sexual Activity: Not on file   Other Topics Concern  . Not on file   Social History Narrative  . No narrative on file    No family history on file.  No Known Allergies  Medication list has been reviewed and updated.  Outpatient  Prescriptions Prior to Visit  Medication Sig Dispense Refill  . Loratadine (CLARITIN) 10 MG CAPS Take by mouth daily as needed.      Marland Kitchen olmesartan-hydrochlorothiazide (BENICAR HCT) 20-12.5 MG per tablet TAKE 1 BY MOUTH DAILY  90 tablet  1  . enoxaparin (LOVENOX) 120 MG/0.8ML injection Inject 0.8 mLs (120 mg total) into the skin daily.  5 Syringe  0  . warfarin (COUMADIN) 5 MG tablet Take as directed by Coumadin Clinic.  30 tablet  2   No facility-administered medications prior to visit.    Review of Systems:   GEN: No acute illnesses, no fevers, chills. GI: No n/v/d, eating normally Pulm: No SOB Interactive and getting along well at home.  Otherwise, ROS is as per the HPI.   Physical Examination: BP 112/74  Pulse 75  Temp(Src) 98.3 F (36.8 C) (Oral)  Ht 4' 11.75" (1.518 m)  Wt 171 lb 8 oz (77.792 kg)  BMI 33.76 kg/m2  LMP 12/26/2011  Ideal Body Weight: Weight in (lb) to have BMI = 25: 126.7   GEN: WDWN, NAD, Non-toxic, A & O x 3 HEENT: Atraumatic, Normocephalic. Neck supple. No masses, No LAD. Ears and Nose: No external deformity. CV: RRR, No M/G/R. No JVD. No thrill.  No extra heart sounds. PULM: CTA B, no wheezes, crackles, rhonchi. No retractions. No resp. distress. No accessory muscle use. EXTR: No c/c/e NEURO Normal gait.  PSYCH: Normally interactive. Conversant. Not depressed or anxious appearing.  Calm demeanor.    Assessment and Plan:  Left femoral vein DVT - Plan: Hypercoagulable panel, comprehensive  High blood pressure  Check hypercoagulable panel greater than 2 weeks off of Coumadin.  Decreased blood pressure medication to hydrochlorothiazide 12.5 mg daily only. She has a blood pressure cuff at home and we will check this and call me if it is elevated after this decrease.  I reassured her that her neuropathic sensations are not coming from plantar fasciitis, and they are certainly most likely coming from her lower spine. Encouraged her to begin activity  again.  Orders Today:  Orders Placed This Encounter  Procedures  . Hypercoagulable panel, comprehensive    Standing Status: Future     Number of Occurrences:      Standing Expiration Date: 12/26/2013    Updated Medication List: (Includes new medications, updates to list, dose adjustments) Meds ordered this encounter  Medications  . hydrochlorothiazide (HYDRODIURIL) 12.5 MG tablet    Sig: Take 1 tablet (12.5 mg total) by mouth daily.    Dispense:  30 tablet    Refill:  3    Medications Discontinued: Medications Discontinued During This Encounter  Medication Reason  . enoxaparin (LOVENOX) 120 MG/0.8ML injection Completed Course  . warfarin (COUMADIN) 5 MG tablet Completed Course  . olmesartan-hydrochlorothiazide (BENICAR HCT) 20-12.5 MG per tablet       Signed,  Jacalynn Buzzell T. Gerline Ratto, MD, CAQ Sports Medicine  [employer] at [facility] [address] [place] [address] Phone: [phone] Fax: [phone]

## 2013-01-16 ENCOUNTER — Other Ambulatory Visit (INDEPENDENT_AMBULATORY_CARE_PROVIDER_SITE_OTHER): Payer: BC Managed Care – PPO

## 2013-01-16 DIAGNOSIS — I824Y9 Acute embolism and thrombosis of unspecified deep veins of unspecified proximal lower extremity: Secondary | ICD-10-CM

## 2013-01-16 DIAGNOSIS — I82412 Acute embolism and thrombosis of left femoral vein: Secondary | ICD-10-CM

## 2013-01-18 LAB — HYPERCOAGULABLE PANEL, COMPREHENSIVE
AntiThromb III Func: 121 % (ref 76–126)
Anticardiolipin IgA: 7 APL U/mL (ref <22)
Anticardiolipin IgG: 14 GPL U/mL (ref <23)
Anticardiolipin IgM: 5 MPL U/mL (ref <11)
Beta-2 Glyco I IgG: 9 G Units (ref <20)
Beta-2-Glycoprotein I IgA: 0 A Units (ref <20)
Beta-2-Glycoprotein I IgM: 5 M Units (ref <20)
DRVVT: 27.5 secs (ref <42.9)
Lupus Anticoagulant: NOT DETECTED
PTT Lupus Anticoagulant: 31.3 secs (ref 28.0–43.0)
Protein C Activity: 200 % — ABNORMAL HIGH (ref 75–133)
Protein C, Total: 126 % (ref 72–160)
Protein S Activity: 111 % (ref 69–129)
Protein S Total: 106 % (ref 60–150)

## 2013-05-18 ENCOUNTER — Telehealth: Payer: Self-pay

## 2013-05-18 ENCOUNTER — Telehealth: Payer: Self-pay | Admitting: Family Medicine

## 2013-05-18 MED ORDER — LOSARTAN POTASSIUM-HCTZ 50-12.5 MG PO TABS: 1.0000 | ORAL_TABLET | Freq: Every day | ORAL | Status: DC

## 2013-05-18 NOTE — Telephone Encounter (Signed)
This is up to her preference. We can do PA or change to preferred brand. Up to her.

## 2013-05-18 NOTE — Telephone Encounter (Signed)
From my memory, she is a very nice patient. Should be no problem.

## 2013-05-18 NOTE — Telephone Encounter (Signed)
Called pt to advise that Dr. Lorelei Pont stopped the Benicar because her BP was low and started her on HCTZ and per pt she stopped the HCTZ after about a week because it wasn't keeping her BP down, pt restarted Benicar on her own. She's needing a prior auth for Benicar or switch to something different: IRBESARTAN-HCTZ 150-12.5 MG, or VALSARTAN-HCTZ 80-12.5 MG

## 2013-05-18 NOTE — Telephone Encounter (Signed)
Pt checking on status of Benicar prior auth; pt request cb at [phone]; pt has 4 days of medication left.

## 2013-05-18 NOTE — Telephone Encounter (Signed)
Patient finally agreed to change medication and I sent rx to [facility] and scheduled f/u with Dr. Diona Browner in a few weeks

## 2013-05-18 NOTE — Telephone Encounter (Signed)
Notified patient & changed PCP in system

## 2013-05-18 NOTE — Telephone Encounter (Signed)
No problem.. Make first appt with me 30 min OV for review of chart etc.

## 2013-05-18 NOTE — Telephone Encounter (Signed)
Pt would like to begin seeing a female dr so would like to xfer her care from Dr. Lorelei Pont to Dr. Diona Browner as her PCP. She says she loves Dr. Lorelei Pont, but is just ready for a female dr, nothing against anyone. Is this ok w/both of you? Please advise.

## 2013-06-07 ENCOUNTER — Encounter: Payer: Self-pay | Admitting: Family Medicine

## 2013-06-07 ENCOUNTER — Ambulatory Visit (INDEPENDENT_AMBULATORY_CARE_PROVIDER_SITE_OTHER): Payer: BC Managed Care – PPO | Admitting: Family Medicine

## 2013-06-07 ENCOUNTER — Ambulatory Visit: Payer: BC Managed Care – PPO | Admitting: Family Medicine

## 2013-06-07 VITALS — BP 128/74 | HR 69 | Temp 98.4°F | Ht 59.75 in | Wt 174.5 lb

## 2013-06-07 DIAGNOSIS — I824Y9 Acute embolism and thrombosis of unspecified deep veins of unspecified proximal lower extremity: Secondary | ICD-10-CM

## 2013-06-07 DIAGNOSIS — I82412 Acute embolism and thrombosis of left femoral vein: Secondary | ICD-10-CM

## 2013-06-07 DIAGNOSIS — Z8249 Family history of ischemic heart disease and other diseases of the circulatory system: Secondary | ICD-10-CM

## 2013-06-07 DIAGNOSIS — D721 Eosinophilia, unspecified: Secondary | ICD-10-CM

## 2013-06-07 DIAGNOSIS — I10 Essential (primary) hypertension: Secondary | ICD-10-CM

## 2013-06-07 DIAGNOSIS — E7800 Pure hypercholesterolemia, unspecified: Secondary | ICD-10-CM

## 2013-06-07 LAB — COMPREHENSIVE METABOLIC PANEL
ALT: 25 U/L (ref 0–35)
AST: 20 U/L (ref 0–37)
Albumin: 4.1 g/dL (ref 3.5–5.2)
Alkaline Phosphatase: 85 U/L (ref 39–117)
BUN: 13 mg/dL (ref 6–23)
CO2: 26 mEq/L (ref 19–32)
Calcium: 9.7 mg/dL (ref 8.4–10.5)
Chloride: 100 mEq/L (ref 96–112)
Creatinine, Ser: 1 mg/dL (ref 0.4–1.2)
GFR: 75.26 mL/min (ref >60.00)
Glucose, Bld: 87 mg/dL (ref 70–99)
Potassium: 3.6 mEq/L (ref 3.5–5.1)
Sodium: 136 mEq/L (ref 135–145)
Total Bilirubin: 0.5 mg/dL (ref 0.3–1.2)
Total Protein: 7.7 g/dL (ref 6.0–8.3)

## 2013-06-07 LAB — LIPID PANEL
Cholesterol: 299 mg/dL — ABNORMAL HIGH (ref 0–200)
HDL: 57.7 mg/dL (ref >39.00)
LDL Cholesterol: 214 mg/dL — ABNORMAL HIGH (ref 0–99)
Total CHOL/HDL Ratio: 5
Triglycerides: 135 mg/dL (ref 0.0–149.0)
VLDL: 27 mg/dL (ref 0.0–40.0)

## 2013-06-07 MED ORDER — LOSARTAN POTASSIUM-HCTZ 50-12.5 MG PO TABS: 1.0000 | ORAL_TABLET | Freq: Every day | ORAL | Status: DC

## 2013-06-07 NOTE — Progress Notes (Signed)
   Subjective:    Patient ID: Natalie Rice, female    DOB: 11-02-1955, 58 y.o.   MRN: [id number]  Gynecologic Exam Pertinent negatives include no constipation, diarrhea or fever.    58 year old female pt previously pt of Dr. Lillie Fragmin presents for follow up and to change providers.   She has been feeling well overall.  Elevated Cholesterol: Due for re-eval. Lab Results  Component Value Date   CHOL 193 02/25/2012   HDL 50.70 02/25/2012   LDLCALC 126* 02/25/2012   TRIG 82.0 02/25/2012   CHOLHDL 4 02/25/2012  Using medications without problems: In past  SE on pravastatin 40 mg daily. Diet compliance: fruits and veggies, does like sweets/baking Exercise: 45 min 43 days a week on treadmill Other complaints:  Hypertension:   Well controlled on losartan HCTZ. BP Readings from Last 3 Encounters:  06/07/13 128/74  12/26/12 112/74  09/19/12 120/70  Using medication without problems or lightheadedness:  Chest pain with exertion: none Edema:None Short of breath:None Average home BPs:117/70s Other issues:  Hx DVT: Nml hypercog panel in 2014.   She has retired in August.  Has not had CPX in a while. No recent pap. Mother with cervical cancer. Due for mammogram.  Review of Systems  Constitutional: Negative for fever and fatigue.  HENT: Negative for rhinorrhea and sinus pressure.   Respiratory: Negative for shortness of breath.   Cardiovascular: Negative for chest pain.  Gastrointestinal: Negative for diarrhea and constipation.  Musculoskeletal: Negative for arthralgias.  Psychiatric/Behavioral: Negative for dysphoric mood.       Objective:   Physical Exam  Constitutional: Vital signs are normal. She appears well-developed and well-nourished. She is cooperative.  Non-toxic appearance. She does not appear ill. No distress.  HENT:  Head: Normocephalic.  Right Ear: Hearing, tympanic membrane, external ear and ear canal normal. Tympanic membrane is not erythematous, not  retracted and not bulging.  Left Ear: Hearing, tympanic membrane, external ear and ear canal normal. Tympanic membrane is not erythematous, not retracted and not bulging.  Nose: No mucosal edema or rhinorrhea. Right sinus exhibits no maxillary sinus tenderness and no frontal sinus tenderness. Left sinus exhibits no maxillary sinus tenderness and no frontal sinus tenderness.  Mouth/Throat: Uvula is midline, oropharynx is clear and moist and mucous membranes are normal.  Eyes: Conjunctivae, EOM and lids are normal. Pupils are equal, round, and reactive to light. Lids are everted and swept, no foreign bodies found.  Neck: Trachea normal and normal range of motion. Neck supple. Carotid bruit is not present. No mass and no thyromegaly present.  Cardiovascular: Normal rate, regular rhythm, S1 normal, S2 normal, normal heart sounds, intact distal pulses and normal pulses.  Exam reveals no gallop and no friction rub.   No murmur heard. Pulmonary/Chest: Effort normal and breath sounds normal. Not tachypneic. No respiratory distress. She has no decreased breath sounds. She has no wheezes. She has no rhonchi. She has no rales.  Abdominal: Soft. Normal appearance and bowel sounds are normal. There is no tenderness.  Neurological: She is alert.  Skin: Skin is warm, dry and intact. No rash noted.  Psychiatric: Her speech is normal and behavior is normal. Judgment and thought content normal. Her mood appears not anxious. Cognition and memory are normal. She does not exhibit a depressed mood.          Assessment & Plan:

## 2013-06-07 NOTE — Assessment & Plan Note (Signed)
Due for re-eval. Encouraged exercise, weight loss, healthy eating habits.

## 2013-06-07 NOTE — Patient Instructions (Addendum)
Stop at lab on way out.  Schedule CPX in next few weeks, no labs prior.  Work on healthy eating ( decrease sweets), continue regular exercise and work on weight loss.

## 2013-06-07 NOTE — Progress Notes (Signed)
Pre visit review using our clinic review tool, if applicable. No additional management support is needed unless otherwise documented below in the visit note. 

## 2013-06-07 NOTE — Assessment & Plan Note (Signed)
Well controlled. Continue current medication.  

## 2013-06-08 ENCOUNTER — Telehealth: Payer: Self-pay | Admitting: Family Medicine

## 2013-06-08 MED ORDER — PRAVASTATIN SODIUM 20 MG PO TABS: 20.0000 mg | ORAL_TABLET | Freq: Every day | ORAL | Status: DC

## 2013-06-08 NOTE — Telephone Encounter (Signed)
Patient notified as instructed by telephone. 

## 2013-06-08 NOTE — Telephone Encounter (Signed)
Message copied by Jinny Sanders on Thu Jun 08, 2013  1:56 PM ------      Message from: [name]      Created: Thu Jun 08, 2013 12:46 PM       Patient notified as instructed by telephone.  Ms. Natalie Rice is willing to try a lower dose statin.  Low Cholesterol Diet mailed to patient. ------

## 2013-06-08 NOTE — Telephone Encounter (Signed)
Lets start pravastatin 20 mg daily.  She can start every few days and gradually increase to daily.  She can also take it along with co Q 10 ( OTC, dose on bottle) to help decrease SE possibility.

## 2013-06-22 ENCOUNTER — Ambulatory Visit (INDEPENDENT_AMBULATORY_CARE_PROVIDER_SITE_OTHER): Payer: BC Managed Care – PPO | Admitting: Family Medicine

## 2013-06-22 ENCOUNTER — Encounter: Payer: Self-pay | Admitting: Family Medicine

## 2013-06-22 ENCOUNTER — Telehealth: Payer: Self-pay | Admitting: Family Medicine

## 2013-06-22 ENCOUNTER — Other Ambulatory Visit (HOSPITAL_COMMUNITY)
Admission: RE | Admit: 2013-06-22 | Discharge: 2013-06-22 | Disposition: A | Discharge status: Home / Self Care | Payer: BC Managed Care – PPO | Source: Ambulatory Visit | Attending: Family Medicine | Admitting: Family Medicine

## 2013-06-22 VITALS — BP 130/80 | HR 67 | Temp 98.4°F | Ht 59.0 in | Wt 175.2 lb

## 2013-06-22 DIAGNOSIS — Z1151 Encounter for screening for human papillomavirus (HPV): Secondary | ICD-10-CM | POA: Insufficient documentation

## 2013-06-22 DIAGNOSIS — Z23 Encounter for immunization: Secondary | ICD-10-CM

## 2013-06-22 DIAGNOSIS — I10 Essential (primary) hypertension: Secondary | ICD-10-CM

## 2013-06-22 DIAGNOSIS — Z01419 Encounter for gynecological examination (general) (routine) without abnormal findings: Secondary | ICD-10-CM | POA: Insufficient documentation

## 2013-06-22 DIAGNOSIS — E7800 Pure hypercholesterolemia, unspecified: Secondary | ICD-10-CM

## 2013-06-22 DIAGNOSIS — Z0000 Encounter for general adult medical examination without abnormal findings: Secondary | ICD-10-CM

## 2013-06-22 DIAGNOSIS — Z124 Encounter for screening for malignant neoplasm of cervix: Secondary | ICD-10-CM

## 2013-06-22 NOTE — Progress Notes (Signed)
Pre visit review using our clinic review tool, if applicable. No additional management support is needed unless otherwise documented below in the visit note. 

## 2013-06-22 NOTE — Patient Instructions (Addendum)
Call to schedule mammogram on your own.  TDap given today. Return for labs only in 3 months to re-eval cholesterol on pravastatin.

## 2013-06-22 NOTE — Addendum Note (Signed)
Addended by: [name] on: 06/22/2013 02:50 PM   Modules accepted: Orders

## 2013-06-22 NOTE — Assessment & Plan Note (Signed)
Very poor control, but now on pravastatin.  Will recheck in 3 months.

## 2013-06-22 NOTE — Progress Notes (Signed)
Subjective:    Patient ID: Natalie Rice, female    DOB: 04-14-1955, 58 y.o.   MRN: [id number]  HPI The patient is here for annual wellness exam and preventative care.    Doing weel overall.  Hypertension:   Well controlled on current regimen.  tolerating losartan HCTZ. BP Readings from Last 3 Encounters:  06/22/13 130/80  06/07/13 128/74  12/26/12 112/74  Using medication without problems or lightheadedness: none Chest pain with exertion:None Edema:None Short of breath:None Average home BPs:not lately. Other issues:  Elevated Cholesterol: Poor control off med, now on pravastatin 20 mg daily. Gradually tapering up. She is using CoQ. Using medications without problems: None Muscle aches: None Diet compliance:Moderate Exercise: Treadmill 2-3 times a week. 50 min at a time. Other complaints: Wt Readings from Last 3 Encounters:  06/22/13 175 lb 4 oz (79.493 kg)  06/07/13 174 lb 8 oz (79.153 kg)  12/26/12 171 lb 8 oz (77.792 kg)    . Lab Results  Component Value Date   CHOL 299* 06/07/2013   HDL 57.70 06/07/2013   LDLCALC 214* 06/07/2013   TRIG 135.0 06/07/2013   CHOLHDL 5 06/07/2013      Review of Systems  Constitutional: Negative for fever and fatigue.  HENT: Negative for ear pain.   Eyes: Negative for pain.  Respiratory: Negative for chest tightness and shortness of breath.   Cardiovascular: Negative for chest pain, palpitations and leg swelling.  Gastrointestinal: Negative for abdominal pain.  Genitourinary: Negative for dysuria.       Objective:   Physical Exam  Constitutional: Vital signs are normal. She appears well-developed and well-nourished. She is cooperative.  Non-toxic appearance. She does not appear ill. No distress.  HENT:  Head: Normocephalic.  Right Ear: Hearing, tympanic membrane, external ear and ear canal normal.  Left Ear: Hearing, tympanic membrane, external ear and ear canal normal.  Nose: Nose normal.  Eyes: Conjunctivae, EOM and lids  are normal. Pupils are equal, round, and reactive to light. Lids are everted and swept, no foreign bodies found.  Neck: Trachea normal and normal range of motion. Neck supple. Carotid bruit is not present. No mass and no thyromegaly present.  Cardiovascular: Normal rate, regular rhythm, S1 normal, S2 normal, normal heart sounds and intact distal pulses.  Exam reveals no gallop.   No murmur heard. Pulmonary/Chest: Effort normal and breath sounds normal. No respiratory distress. She has no wheezes. She has no rhonchi. She has no rales.  Abdominal: Soft. Normal appearance and bowel sounds are normal. She exhibits no distension, no fluid wave, no abdominal bruit and no mass. There is no hepatosplenomegaly. There is no tenderness. There is no rebound, no guarding and no CVA tenderness. No hernia.  Genitourinary: Vagina normal and uterus normal. No breast swelling, tenderness, discharge or bleeding. Pelvic exam was performed with patient supine. There is no rash, tenderness or lesion on the right labia. There is no rash, tenderness or lesion on the left labia. Uterus is not enlarged and not tender. Cervix exhibits no motion tenderness, no discharge and no friability. Right adnexum displays no mass, no tenderness and no fullness. Left adnexum displays no mass, no tenderness and no fullness.  Lymphadenopathy:    She has no cervical adenopathy.    She has no axillary adenopathy.  Neurological: She is alert. She has normal strength. No cranial nerve deficit or sensory deficit.  Skin: Skin is warm, dry and intact. No rash noted.  Psychiatric: Her speech is normal and behavior is  normal. Judgment normal. Her mood appears not anxious. Cognition and memory are normal. She does not exhibit a depressed mood.          Assessment & Plan:  The patient's preventative maintenance and recommended screening tests for an annual wellness exam were reviewed in full today. Brought up to date unless services  declined.  Counselled on the importance of diet, exercise, and its role in overall health and mortality. The patient's FH and SH was reviewed, including their home life, tobacco status, and drug and alcohol status.   Vaccines:Due for tdap Colon: Nml 2012, plan repeat Mammo:02/2012 nml PAP/DVE: Due.   Will need 3 years in a row, then space. Nonsmoker

## 2013-06-22 NOTE — Telephone Encounter (Signed)
Relevant patient education assigned to patient using Emmi. ° °

## 2013-06-22 NOTE — Assessment & Plan Note (Signed)
Well controlled. Continue current medication.  

## 2013-06-27 ENCOUNTER — Encounter: Payer: Self-pay | Admitting: *Deleted

## 2013-09-21 ENCOUNTER — Other Ambulatory Visit (INDEPENDENT_AMBULATORY_CARE_PROVIDER_SITE_OTHER): Payer: BC Managed Care – PPO

## 2013-09-21 DIAGNOSIS — E7800 Pure hypercholesterolemia, unspecified: Secondary | ICD-10-CM

## 2013-09-21 LAB — LIPID PANEL
Cholesterol: 221 mg/dL — ABNORMAL HIGH (ref 0–200)
HDL: 54.9 mg/dL (ref >39.00)
LDL Cholesterol: 144 mg/dL — ABNORMAL HIGH (ref 0–99)
NonHDL: 166.1
Total CHOL/HDL Ratio: 4
Triglycerides: 109 mg/dL (ref 0.0–149.0)
VLDL: 21.8 mg/dL (ref 0.0–40.0)

## 2013-09-21 LAB — COMPREHENSIVE METABOLIC PANEL
ALT: 23 U/L (ref 0–35)
AST: 20 U/L (ref 0–37)
Albumin: 3.8 g/dL (ref 3.5–5.2)
Alkaline Phosphatase: 85 U/L (ref 39–117)
BUN: 15 mg/dL (ref 6–23)
CO2: 26 mEq/L (ref 19–32)
Calcium: 9.7 mg/dL (ref 8.4–10.5)
Chloride: 104 mEq/L (ref 96–112)
Creatinine, Ser: 0.9 mg/dL (ref 0.4–1.2)
GFR: 85.13 mL/min (ref >60.00)
Glucose, Bld: 104 mg/dL — ABNORMAL HIGH (ref 70–99)
Potassium: 3.7 mEq/L (ref 3.5–5.1)
Sodium: 139 mEq/L (ref 135–145)
Total Bilirubin: 0.4 mg/dL (ref 0.2–1.2)
Total Protein: 7.3 g/dL (ref 6.0–8.3)

## 2013-12-15 ENCOUNTER — Ambulatory Visit (INDEPENDENT_AMBULATORY_CARE_PROVIDER_SITE_OTHER): Payer: BC Managed Care – PPO | Admitting: Family Medicine

## 2013-12-15 ENCOUNTER — Encounter: Payer: Self-pay | Admitting: Family Medicine

## 2013-12-15 VITALS — BP 108/80 | HR 66 | Temp 98.5°F | Ht 59.0 in | Wt 180.1 lb

## 2013-12-15 DIAGNOSIS — B372 Candidiasis of skin and nail: Secondary | ICD-10-CM | POA: Insufficient documentation

## 2013-12-15 DIAGNOSIS — Z23 Encounter for immunization: Secondary | ICD-10-CM

## 2013-12-15 MED ORDER — NYSTATIN 100000 UNIT/GM EX CREA: 1.0000 "application " | TOPICAL_CREAM | Freq: Two times a day (BID) | CUTANEOUS | Status: DC

## 2013-12-15 MED ORDER — FLUCONAZOLE 150 MG PO TABS: 150.0000 mg | ORAL_TABLET | Freq: Once | ORAL | Status: DC

## 2013-12-15 NOTE — Assessment & Plan Note (Signed)
Given recurrent. Treat with oral antifungal as well as topical cream

## 2013-12-15 NOTE — Progress Notes (Signed)
Pre visit review using our clinic review tool, if applicable. No additional management support is needed unless otherwise documented below in the visit note. 

## 2013-12-15 NOTE — Progress Notes (Signed)
   Subjective:    Patient ID: Natalie Rice, female    DOB: Sep 22, 1955, 58 y.o.   MRN: [id number]  Rash Pertinent negatives include no eye pain, fatigue, fever or shortness of breath.    58 year old female presents with new onset intermittant rash x 2-3 months. Occurs under her breast and under pannus creases. Occ itching, no pain.  She has been using topical antifungal cream.  Feels well otherwise.  In past she has had similar rash but usually goes away.    Review of Systems  Constitutional: Negative for fever and fatigue.  HENT: Negative for ear pain.   Eyes: Negative for pain.  Respiratory: Negative for chest tightness and shortness of breath.   Cardiovascular: Negative for chest pain, palpitations and leg swelling.  Gastrointestinal: Negative for abdominal pain.  Genitourinary: Negative for dysuria.  Skin: Positive for rash.       Objective:   Physical Exam  Constitutional: Vital signs are normal. She appears well-developed and well-nourished. She is cooperative.  Non-toxic appearance. She does not appear ill. No distress.  HENT:  Head: Normocephalic.  Right Ear: Hearing, tympanic membrane, external ear and ear canal normal. Tympanic membrane is not erythematous, not retracted and not bulging.  Left Ear: Hearing, tympanic membrane, external ear and ear canal normal. Tympanic membrane is not erythematous, not retracted and not bulging.  Nose: No mucosal edema or rhinorrhea. Right sinus exhibits no maxillary sinus tenderness and no frontal sinus tenderness. Left sinus exhibits no maxillary sinus tenderness and no frontal sinus tenderness.  Mouth/Throat: Uvula is midline, oropharynx is clear and moist and mucous membranes are normal.  Eyes: Conjunctivae, EOM and lids are normal. Pupils are equal, round, and reactive to light. Lids are everted and swept, no foreign bodies found.  Neck: Trachea normal and normal range of motion. Neck supple. Carotid bruit is not present. No  mass and no thyromegaly present.  Cardiovascular: Normal rate, regular rhythm, S1 normal, S2 normal, normal heart sounds, intact distal pulses and normal pulses.  Exam reveals no gallop and no friction rub.   No murmur heard. Pulmonary/Chest: Effort normal and breath sounds normal. Not tachypneic. No respiratory distress. She has no decreased breath sounds. She has no wheezes. She has no rhonchi. She has no rales.  Abdominal: Soft. Normal appearance and bowel sounds are normal. There is no tenderness.  Neurological: She is alert.  Skin: Skin is warm, dry and intact. No rash noted.  Erythema under breasts Bilaterally, mild, splotchy patches, no vesicles , no blisters  Psychiatric: Her speech is normal and behavior is normal. Judgment and thought content normal. Her mood appears not anxious. Cognition and memory are normal. She does not exhibit a depressed mood.          Assessment & Plan:

## 2013-12-26 ENCOUNTER — Other Ambulatory Visit: Payer: Self-pay | Admitting: Family Medicine

## 2013-12-26 NOTE — Telephone Encounter (Signed)
Last office visit 12/15/2013.  Last filled 12/15/2013 for 30g with no refills.  Ok to refill?

## 2014-01-24 ENCOUNTER — Other Ambulatory Visit: Payer: Self-pay | Admitting: Family Medicine

## 2014-01-24 NOTE — Telephone Encounter (Signed)
Last office visit 12/15/2013.  Ok to refill?

## 2014-02-15 IMAGING — US US EXTREM LOW VENOUS*R*
1 series · 14 of 24 positions shown · non-contrast
Comparison: none

REASON FOR EXAM: painful and swelling with no injury. post long flight
and recent hx DVT
COMMENTS:

[Series 1: us extrem low venous*right* · 0.08mm/px · 14 of 31 slices shown]
[im 1/31]
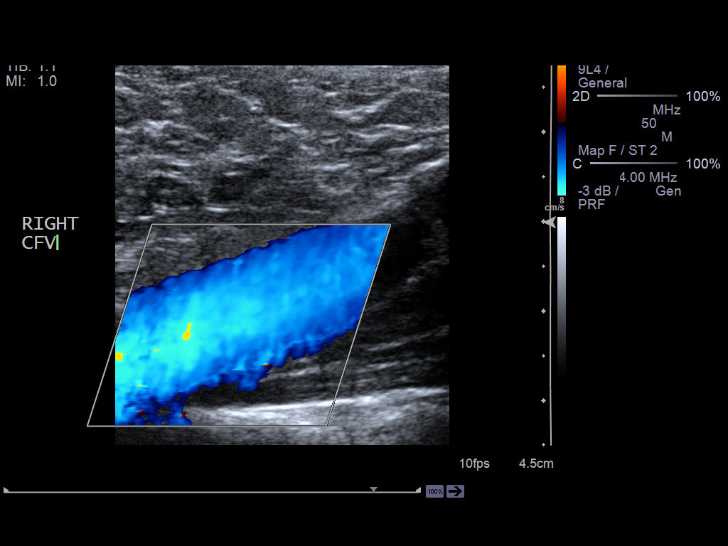
[im 3/31]
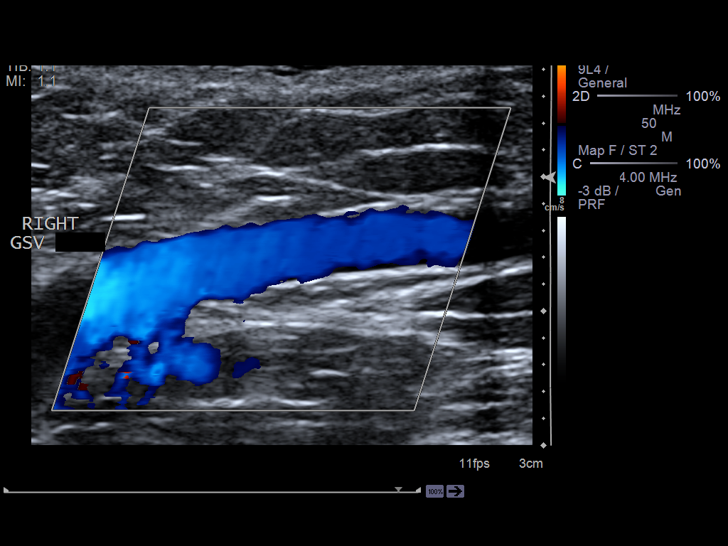
[im 6/31]
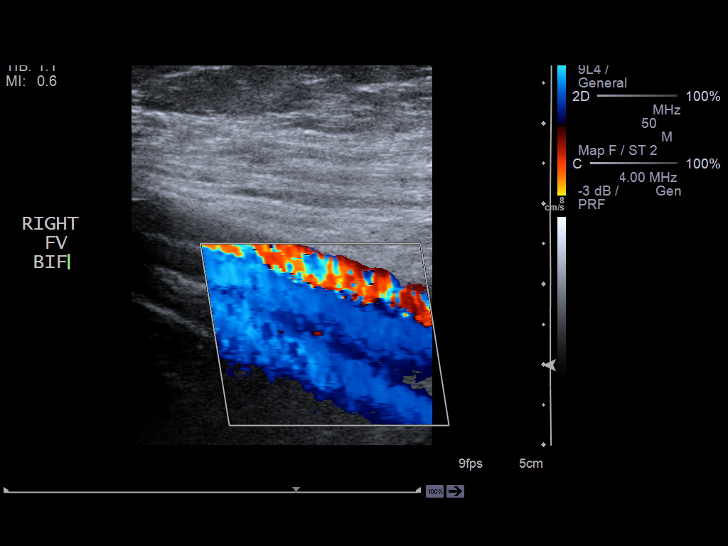
[im 8/31]
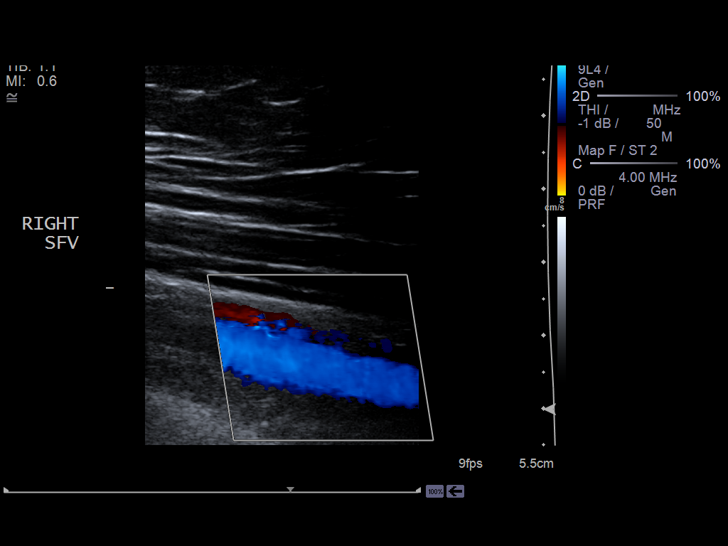
[im 10/31]
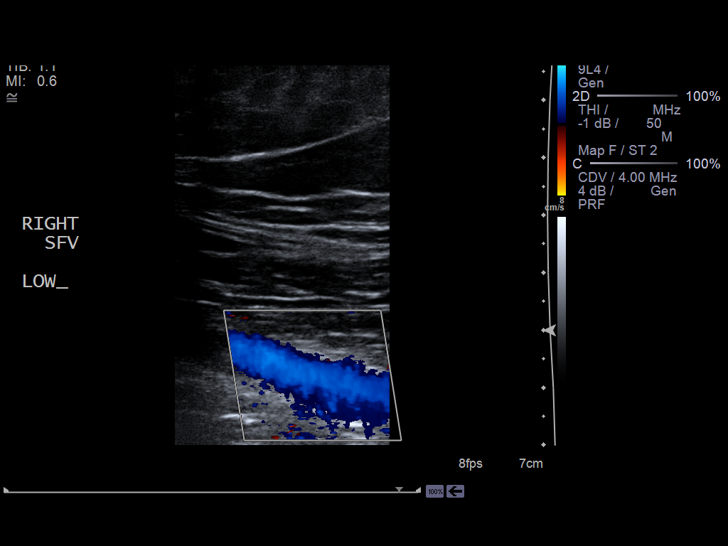
[im 12/31]
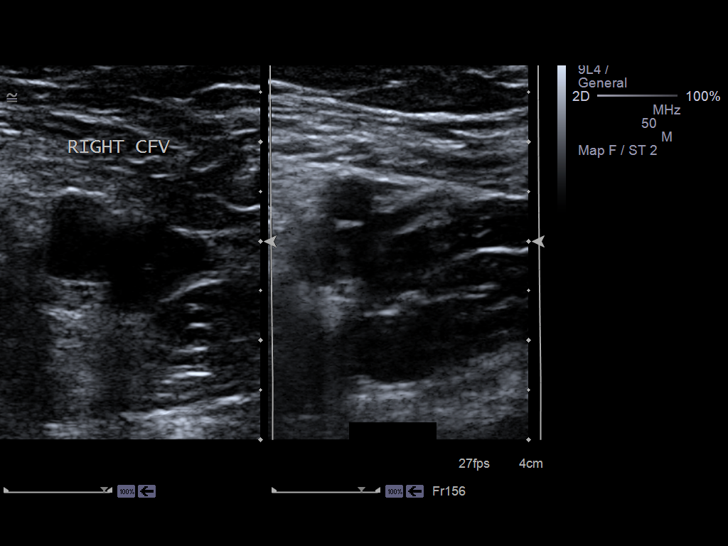
[im 15/31]
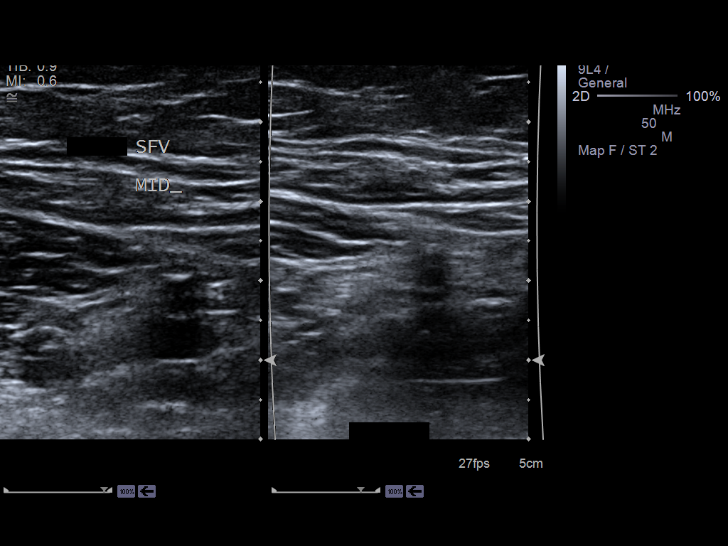
[im 16/31]
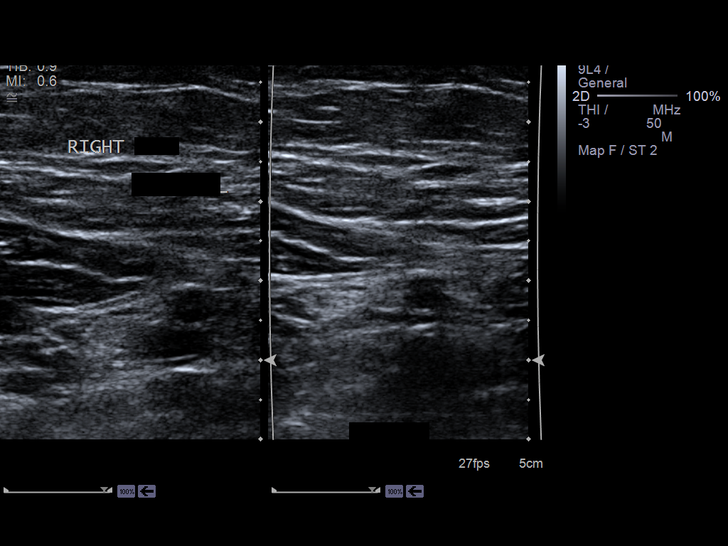
[im 19/31]
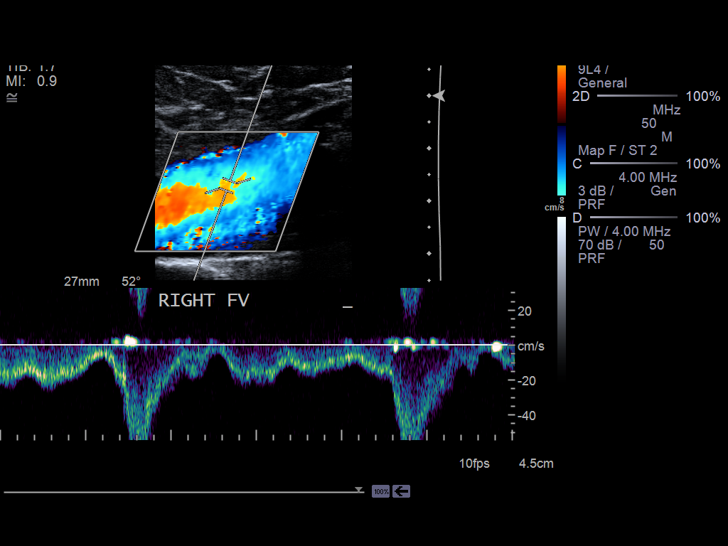
[im 21/31]
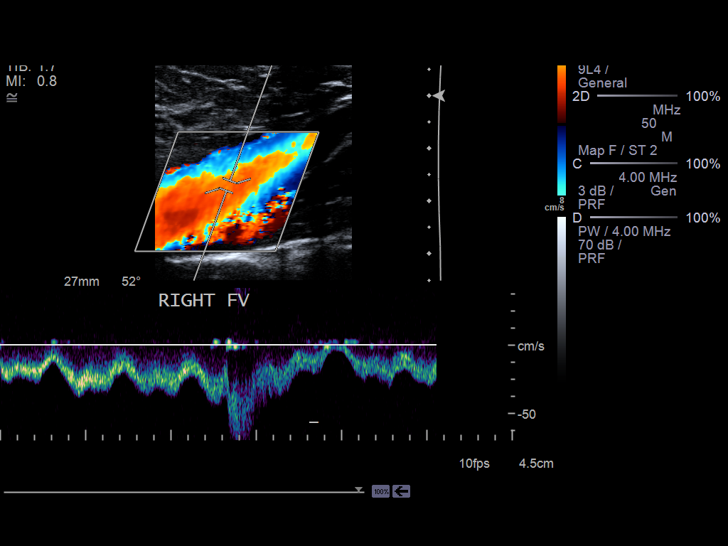
[im 24/31]
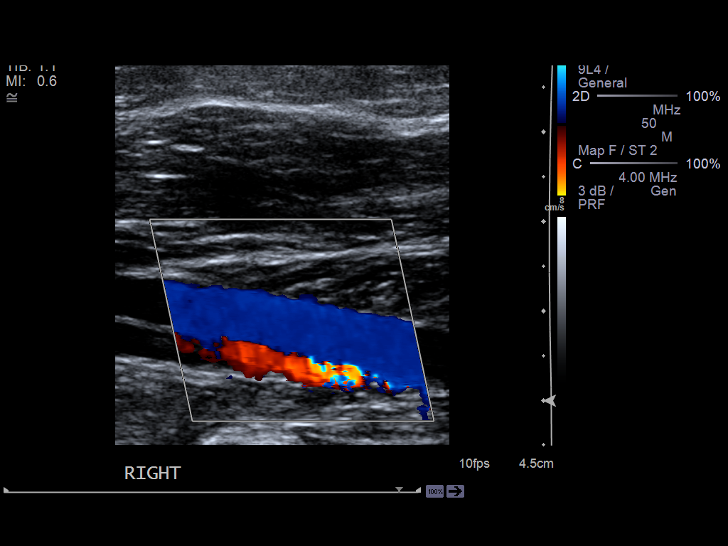
[im 25/31]
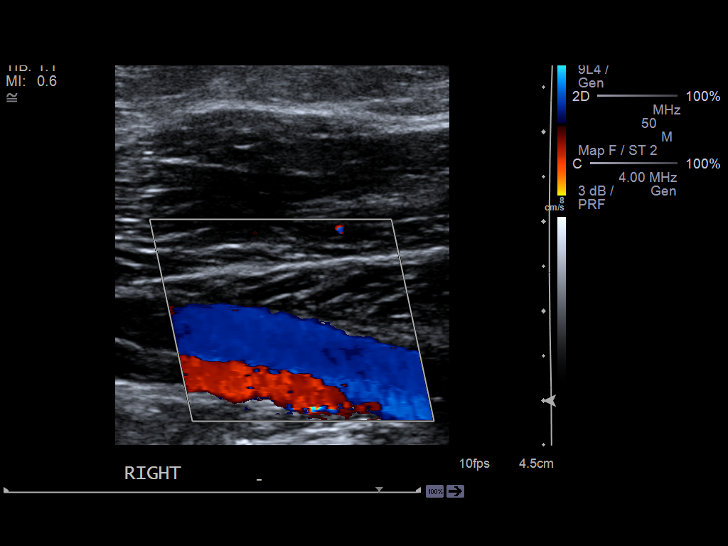
[im 28/31]
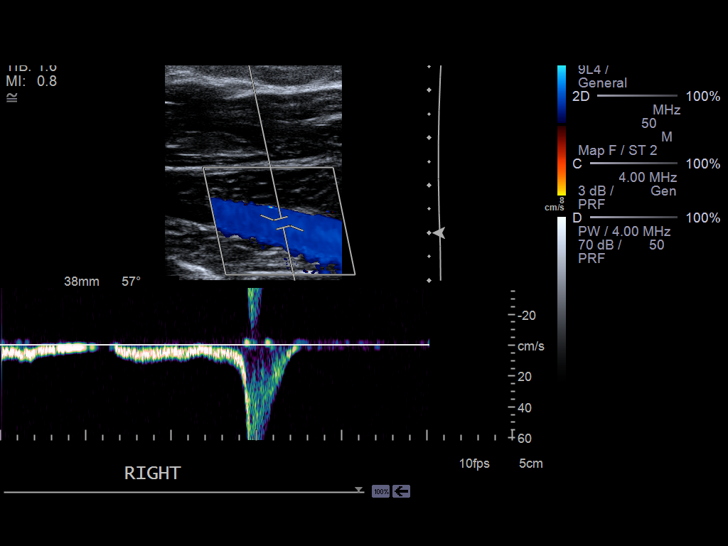
[im 31/31]
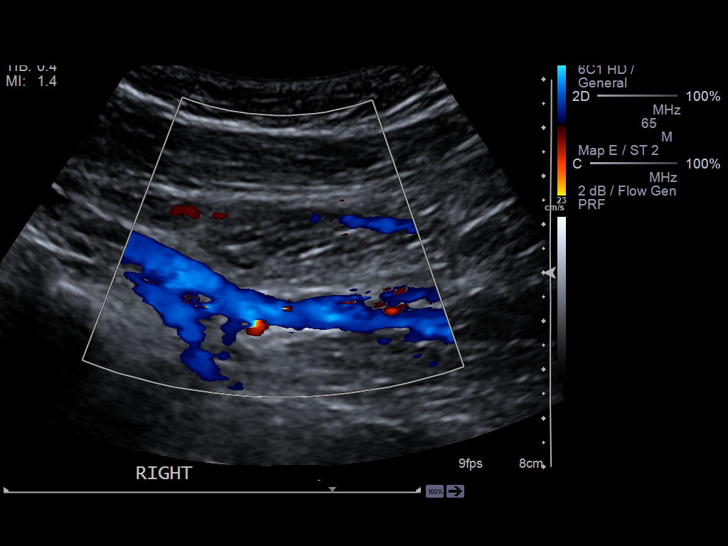

[14 of 24 positions shown; findings below may reference images not displayed]

PROCEDURE:     US  - US DOPPLER LOW EXTR RIGHT  - September 14, 2012  [DATE]

RESULT:     Doppler interrogation of the deep venous system of the right leg
is performed from the common femoral vein through the popliteal vein.
Grayscale compression images show complete compressibility. The color
Doppler and spectral Doppler appearance is normal. There is no evidence of
abnormal fluid collection.
IMPRESSION: 1. No evidence of right lower extremity deep vein thrombosis.

[REDACTED]

## 2014-06-04 ENCOUNTER — Telehealth: Payer: Self-pay | Admitting: Family Medicine

## 2014-06-04 NOTE — Telephone Encounter (Signed)
Please call and scheduled CPE with fasting labs prior with Dr. Diona Browner for sometime after April 9.

## 2014-06-04 NOTE — Telephone Encounter (Signed)
Labs 5/2 cpx 5/6 Pt aware

## 2014-07-15 ENCOUNTER — Telehealth: Payer: Self-pay | Admitting: Family Medicine

## 2014-07-15 DIAGNOSIS — M109 Gout, unspecified: Secondary | ICD-10-CM

## 2014-07-15 DIAGNOSIS — E7800 Pure hypercholesterolemia, unspecified: Secondary | ICD-10-CM

## 2014-07-15 NOTE — Telephone Encounter (Signed)
-----   Message from Ellamae Sia sent at 07/13/2014  9:32 AM EDT ----- Regarding: Lab orders for Monday, 5.2.16 Patient is scheduled for CPX labs, please order future labs, Thanks , Karna Christmas

## 2014-07-16 ENCOUNTER — Other Ambulatory Visit (INDEPENDENT_AMBULATORY_CARE_PROVIDER_SITE_OTHER): Payer: BLUE CROSS/BLUE SHIELD

## 2014-07-16 DIAGNOSIS — M109 Gout, unspecified: Secondary | ICD-10-CM

## 2014-07-16 DIAGNOSIS — E7800 Pure hypercholesterolemia, unspecified: Secondary | ICD-10-CM

## 2014-07-16 LAB — COMPREHENSIVE METABOLIC PANEL
ALT: 19 U/L (ref 0–35)
AST: 19 U/L (ref 0–37)
Albumin: 3.9 g/dL (ref 3.5–5.2)
Alkaline Phosphatase: 98 U/L (ref 39–117)
BUN: 14 mg/dL (ref 6–23)
CO2: 28 mEq/L (ref 19–32)
Calcium: 10 mg/dL (ref 8.4–10.5)
Chloride: 101 mEq/L (ref 96–112)
Creatinine, Ser: 0.89 mg/dL (ref 0.40–1.20)
GFR: 83.79 mL/min (ref >60.00)
Glucose, Bld: 95 mg/dL (ref 70–99)
Potassium: 3.8 mEq/L (ref 3.5–5.1)
Sodium: 137 mEq/L (ref 135–145)
Total Bilirubin: 0.3 mg/dL (ref 0.2–1.2)
Total Protein: 7.6 g/dL (ref 6.0–8.3)

## 2014-07-16 LAB — LIPID PANEL
Cholesterol: 314 mg/dL — ABNORMAL HIGH (ref 0–200)
HDL: 51.7 mg/dL (ref >39.00)
LDL Cholesterol: 235 mg/dL — ABNORMAL HIGH (ref 0–99)
NonHDL: 262.3
Total CHOL/HDL Ratio: 6
Triglycerides: 135 mg/dL (ref 0.0–149.0)
VLDL: 27 mg/dL (ref 0.0–40.0)

## 2014-07-16 LAB — URIC ACID: Uric Acid, Serum: 6.5 mg/dL (ref 2.4–7.0)

## 2014-07-20 ENCOUNTER — Encounter: Payer: Self-pay | Admitting: Family Medicine

## 2014-07-31 ENCOUNTER — Encounter: Payer: Self-pay | Admitting: Family Medicine

## 2014-07-31 ENCOUNTER — Telehealth: Payer: Self-pay

## 2014-07-31 ENCOUNTER — Ambulatory Visit (INDEPENDENT_AMBULATORY_CARE_PROVIDER_SITE_OTHER): Payer: BLUE CROSS/BLUE SHIELD | Admitting: Family Medicine

## 2014-07-31 VITALS — BP 124/80 | HR 80 | Temp 98.5°F | Ht 59.5 in | Wt 180.8 lb

## 2014-07-31 DIAGNOSIS — Z0000 Encounter for general adult medical examination without abnormal findings: Secondary | ICD-10-CM

## 2014-07-31 DIAGNOSIS — I10 Essential (primary) hypertension: Secondary | ICD-10-CM | POA: Diagnosis not present

## 2014-07-31 DIAGNOSIS — E7800 Pure hypercholesterolemia, unspecified: Secondary | ICD-10-CM

## 2014-07-31 MED ORDER — CLOTRIMAZOLE-BETAMETHASONE 1-0.05 % EX CREA: 1.0000 "application " | TOPICAL_CREAM | Freq: Two times a day (BID) | CUTANEOUS | Status: DC

## 2014-07-31 NOTE — Assessment & Plan Note (Signed)
Poor control of medication, restart.

## 2014-07-31 NOTE — Progress Notes (Signed)
The patient is here for annual wellness exam and preventative care.  Doing weel overall.  Last menses 1-2 years ago. Having hot flashes. Tolerable.  Rash in 12/2014.. Only thing  That worked was husbands cream? clotrimazole beta?  Hypertension:  Well controlled on current regimen. tolerating losartan HCTZ BP Readings from Last 3 Encounters:  07/31/14 124/80  12/15/13 108/80  06/22/13 130/80  Chest pain with exertion:None Edema:None Short of breath:None Average home BPs: 120/70s usually, one elevated BP with coffee and 144/94 running around. Other issues:  Elevated Cholesterol:  Poor control  She stopped pravastatin 20 mg daily due to rash., rash did not stop with  Stopping.  On co Q 10. Lab Results  Component Value Date   CHOL 314* 07/16/2014   HDL 51.70 07/16/2014   LDLCALC 235* 07/16/2014   TRIG 135.0 07/16/2014   CHOLHDL 6 07/16/2014  Using medications without problems: None Muscle aches: None Diet compliance:Moderate Exercise: Has not been exercising. Other complaints  Wt Readings from Last 3 Encounters:  07/31/14 180 lb 12 oz (81.988 kg)  12/15/13 180 lb 2 oz (81.704 kg)  06/22/13 175 lb 4 oz (79.493 kg)   Gout flares: rare flares. Uric acid improved at 6.5   Review of Systems  Constitutional: Negative for fever and fatigue.  HENT: Negative for ear pain.  Eyes: Negative for pain.  Respiratory: Negative for chest tightness and shortness of breath.  Cardiovascular: Negative for chest pain, palpitations and leg swelling.  Gastrointestinal: Negative for abdominal pain.  Genitourinary: Negative for dysuria.       Objective:   Physical Exam  Constitutional: Vital signs are normal. She appears well-developed and well-nourished. She is cooperative. Non-toxic appearance. She does not appear ill. No distress.  HENT:  Head: Normocephalic.  Right Ear: Hearing, tympanic membrane, external ear and ear canal normal.  Left Ear: Hearing, tympanic membrane,  external ear and ear canal normal.  Nose: Nose normal.  Eyes: Conjunctivae, EOM and lids are normal. Pupils are equal, round, and reactive to light. Lids are everted and swept, no foreign bodies found.  Neck: Trachea normal and normal range of motion. Neck supple. Carotid bruit is not present. No mass and no thyromegaly present.  Cardiovascular: Normal rate, regular rhythm, S1 normal, S2 normal, normal heart sounds and intact distal pulses. Exam reveals no gallop.  No murmur heard. Pulmonary/Chest: Effort normal and breath sounds normal. No respiratory distress. She has no wheezes. She has no rhonchi. She has no rales.  Abdominal: Soft. Normal appearance and bowel sounds are normal. She exhibits no distension, no fluid wave, no abdominal bruit and no mass. There is no hepatosplenomegaly. There is no tenderness. There is no rebound, no guarding and no CVA tenderness. No hernia.  Genitourinary: Vagina normal and uterus normal. No breast swelling, tenderness, discharge or bleeding. There is no rash, tenderness or lesion on the right labia. There is no rash, tenderness or lesion on the left labia. Uterus is not enlarged and not tender.  Right adnexum displays no mass, no tenderness and no fullness. Left adnexum displays no mass, no tenderness and no fullness.  Lymphadenopathy:   She has no cervical adenopathy.   She has no axillary adenopathy.  Neurological: She is alert. She has normal strength. No cranial nerve deficit or sensory deficit.  Skin: Skin is warm, dry and intact. No rash noted.  Psychiatric: Her speech is normal and behavior is normal. Judgment normal. Her mood appears not anxious. Cognition and memory are normal. She does not  exhibit a depressed mood.          Assessment & Plan:  The patient's preventative maintenance and recommended screening tests for an annual wellness exam were reviewed in full today. Brought up to date unless services declined.  Counselled on the  importance of diet, exercise, and its role in overall health and mortality. The patient's FH and SH was reviewed, including their home life, tobacco status, and drug and alcohol status.   Vaccines:uptodate Colon: Nml 2012, plan repeat in 2022,  Mammo:02/2012 nml, due PAP/DVE:  Nml 06/2013,  no history of abnormals. DVE yearly. Nonsmoker  Hep C: not interested  HIV: None

## 2014-07-31 NOTE — Patient Instructions (Addendum)
Continue pravastatin. Return for  chol lab re-eval in 3 months. For gout low purine diet. Increase water in diet. Decrease junk food, sweets. Get back to exercise. Schedule mammogram on your own.    Low-Purine Diet Purines are compounds that affect the level of uric acid in your body. A low-purine diet is a diet that is low in purines. Eating a low-purine diet can prevent the level of uric acid in your body from getting too high and causing gout or kidney stones or both. WHAT DO I NEED TO KNOW ABOUT THIS DIET?  Choose low-purine foods. Examples of low-purine foods are listed in the next section.  Drink plenty of fluids, especially water. Fluids can help remove uric acid from your body. Try to drink 8-16 cups (1.9-3.8 L) a day.  Limit foods high in fat, especially saturated fat, as fat makes it harder for the body to get rid of uric acid. Foods high in saturated fat include pizza, cheese, ice cream, whole milk, fried foods, and gravies. Choose foods that are lower in fat and lean sources of protein. Use olive oil when cooking as it contains healthy fats that are not high in saturated fat.  Limit alcohol. Alcohol interferes with the elimination of uric acid from your body. If you are having a gout attack, avoid all alcohol.  Keep in mind that different people's bodies react differently to different foods. You will probably learn over time which foods do or do not affect you. If you discover that a food tends to cause your gout to flare up, avoid eating that food. You can more freely enjoy foods that do not cause problems. If you have any questions about a food item, talk to your dietitian or health care provider. WHICH FOODS ARE LOW, MODERATE, AND HIGH IN PURINES? The following is a list of foods that are low, moderate, and high in purines. You can eat any amount of the foods that are low in purines. You may be able to have small amounts of foods that are moderate in purines. Ask your health  care provider how much of a food moderate in purines you can have. Avoid foods high in purines. Grains  Foods low in purines: Enriched white bread, pasta, rice, cake, cornbread, popcorn.  Foods moderate in purines: Whole-grain breads and cereals, wheat germ, bran, oatmeal. Uncooked oatmeal. Dry wheat bran or wheat germ.  Foods high in purines: Pancakes, [place] toast, biscuits, muffins. Vegetables  Foods low in purines: All vegetables, except those that are moderate in purines.  Foods moderate in purines: Asparagus, cauliflower, spinach, mushrooms, green peas. Fruits  All fruits are low in purines. Meats and other Protein Foods  Foods low in purines: Eggs, nuts, peanut butter.  Foods moderate in purines: 80-90% lean beef, lamb, veal, pork, poultry, fish, eggs, peanut butter, nuts. Crab, lobster, oysters, and shrimp. Cooked dried beans, peas, and lentils.  Foods high in purines: Anchovies, sardines, herring, mussels, tuna, codfish, scallops, trout, and haddock. Berniece Salines. Organ meats (such as liver or kidney). Tripe. Game meat. Goose. Sweetbreads. Dairy  All dairy foods are low in purines. Low-fat and fat-free dairy products are best because they are low in saturated fat. Beverages  Drinks low in purines: Water, carbonated beverages, tea, coffee, cocoa.  Drinks moderate in purines: Soft drinks and other drinks sweetened with high-fructose corn syrup. Juices. To find whether a food or drink is sweetened with high-fructose corn syrup, look at the ingredients list.  Drinks high in purines: Alcoholic beverages (  such as beer). Condiments  Foods low in purines: Salt, herbs, olives, pickles, relishes, vinegar.  Foods moderate in purines: Butter, margarine, oils, mayonnaise. Fats and Oils  Foods low in purines: All types, except gravies and sauces made with meat.  Foods high in purines: Gravies and sauces made with meat. Other Foods  Foods low in purines: Sugars, sweets, gelatin. Cake.  Soups made without meat.  Foods moderate in purines: Meat-based or fish-based soups, broths, or bouillons. Foods and drinks sweetened with high-fructose corn syrup.  Foods high in purines: High-fat desserts (such as ice cream, cookies, cakes, pies, doughnuts, and chocolate). Contact your dietitian for more information on foods that are not listed here. Document Released: 06/27/2010 Document Revised: 03/07/2013 Document Reviewed: 02/06/2013 [facility] Patient Information 2015 [place], [place]. This information is not intended to replace advice given to you by your health care provider. Make sure you discuss any questions you have with your health care provider.

## 2014-07-31 NOTE — Telephone Encounter (Signed)
Okay to refill  Apply to affected area BID   30 g, 1 RF

## 2014-07-31 NOTE — Telephone Encounter (Signed)
Pt left v/m; pt was seen earlier today and was to cb with name of cream pt was requesting; clotrimazole betamethasone ditropionate cream 1 % - 0.05%.

## 2014-07-31 NOTE — Progress Notes (Signed)
Pre visit review using our clinic review tool, if applicable. No additional management support is needed unless otherwise documented below in the visit note. 

## 2014-07-31 NOTE — Assessment & Plan Note (Signed)
Well controlled. Continue current medication.  

## 2014-08-15 ENCOUNTER — Other Ambulatory Visit: Payer: Self-pay | Admitting: Family Medicine

## 2014-09-03 ENCOUNTER — Other Ambulatory Visit: Payer: Self-pay | Admitting: Family Medicine

## 2014-10-11 ENCOUNTER — Encounter: Payer: Self-pay | Admitting: *Deleted

## 2014-10-29 ENCOUNTER — Encounter: Payer: Self-pay | Admitting: Family Medicine

## 2015-01-29 ENCOUNTER — Ambulatory Visit (INDEPENDENT_AMBULATORY_CARE_PROVIDER_SITE_OTHER): Payer: BLUE CROSS/BLUE SHIELD

## 2015-01-29 DIAGNOSIS — Z23 Encounter for immunization: Secondary | ICD-10-CM

## 2015-05-24 ENCOUNTER — Telehealth: Payer: Self-pay | Admitting: Family Medicine

## 2015-05-24 NOTE — Telephone Encounter (Signed)
[facility]  Patient Name: Natalie Rice  DOB: 12-24-1955    Initial Comment Caller states has blood in stool   Nurse Assessment  Nurse: Wynetta Emery, RN, Baker Janus Date/Time (Eastern Time): 05/24/2015 10:49:51 AM  Confirm and document reason for call. If symptomatic, describe symptoms. You must click the next button to save text entered. ---Donah Driver noted blood in her stool this am a lot of blood on toilet paper during first wipe.  Has the patient traveled out of the country within the last 30 days? ---No  Does the patient have any new or worsening symptoms? ---Yes  Will a triage be completed? ---Yes  Related visit to physician within the last 2 weeks? ---No  Does the PT have any chronic conditions? (i.e. diabetes, asthma, etc.) ---Unknown  Is this a behavioral health or substance abuse call? ---No     Guidelines    Guideline Title Affirmed Question Affirmed Notes  Rectal Bleeding MILD rectal bleeding (more than just a few drops or streaks)    Final Disposition User   See PCP When Office is Open (within 3 days) Wynetta Emery, RN, Baker Janus    Comments    NOTE: appt for MOnday 05-27-2015 8am with Dr. Bjorn Loser no appts for Dr. Diona Browner on Monday available.   Referrals  REFERRED TO PCP OFFICE

## 2015-05-24 NOTE — Telephone Encounter (Signed)
[facility] scheduled appt 05/27/15 at 8 Am with Dr Deborra Medina.

## 2015-05-24 NOTE — Telephone Encounter (Signed)
[place] for Monday appt, if worsens over the weekend, may need to go to UC/[facility]

## 2015-05-24 NOTE — Telephone Encounter (Signed)
Spoke to pt and advised per [facility]. Pt expressed understanding.

## 2015-05-27 ENCOUNTER — Encounter: Payer: Self-pay | Admitting: Family Medicine

## 2015-05-27 ENCOUNTER — Ambulatory Visit (INDEPENDENT_AMBULATORY_CARE_PROVIDER_SITE_OTHER): Payer: BLUE CROSS/BLUE SHIELD | Admitting: Family Medicine

## 2015-05-27 VITALS — BP 132/78 | HR 69 | Temp 98.1°F | Wt 187.0 lb

## 2015-05-27 DIAGNOSIS — K625 Hemorrhage of anus and rectum: Secondary | ICD-10-CM | POA: Diagnosis not present

## 2015-05-27 NOTE — Patient Instructions (Signed)
Good to see you. Your rectal exam was reassuring . If the bleeding returns or you develop any pain or changes in your bowel habits, please let us know right away.

## 2015-05-27 NOTE — Assessment & Plan Note (Signed)
New and has resolved. Painless- rectal exam and guiac neg. ? Internal hemorrhoids. UTD neg colonoscopy. Advised to let us know if symptoms return and will refer back to GI. The patient indicates understanding of these issues and agrees with the plan.

## 2015-05-27 NOTE — Progress Notes (Signed)
Subjective:   Patient ID: Natalie Rice, female    DOB: [birth year], 60 y.o.   MRN: [id number]  Natalie Rice is a pleasant 60 y.o. year old female patient of Dr. Diona Browner, new to me, who presents to clinic today with Rectal Bleeding  on 05/27/2015  HPI:  Had one episode this weekend of painless rectal bleeding.  Does not feel she was straining or had been constipated.  Large BM and had quite of bit of bright red blood in toilet paper when she wiped.  No blood in stool or toilet water. No changes in bowel habits. No nausea or vomiting.  No black stools.  Symptoms have not returned.  Per pt, neg colonoscopy 10/2010- cleared for 10 years- I do not see this scanned in chart.  Current Outpatient Prescriptions on File Prior to Visit  Medication Sig Dispense Refill  . Biotin w/ Vitamins C & E 1250-7.5-7.5 MCG-MG-UNT CHEW Chew 2 each by mouth daily.    . Cholecalciferol (VITAMIN D3) 5000 UNITS CAPS Take 1 capsule by mouth daily.    . clotrimazole-betamethasone (LOTRISONE) cream Apply 1 application topically 2 (two) times daily. 30 g 1  . COD LIVER OIL W/VIT A & D PO Take 1 capsule by mouth daily.    . Coenzyme Q10 200 MG capsule Take 200 mg by mouth daily.    . Loratadine (CLARITIN) 10 MG CAPS Take by mouth daily as needed.    Marland Kitchen losartan-hydrochlorothiazide (HYZAAR) 50-12.5 MG per tablet TAKE 1 BY MOUTH DAILY 90 tablet 2  . Magnesium 500 MG TABS Take 1 tablet by mouth daily.    . Potassium Gluconate 550 MG TABS Take 1 tablet by mouth daily.    . pravastatin (PRAVACHOL) 20 MG tablet TAKE 1 BY MOUTH DAILY 90 tablet 3   No current facility-administered medications on file prior to visit.    No Known Allergies  Past Medical History  Diagnosis Date  . Hay fever   . High blood pressure   . High cholesterol   . Colon polyp   . Eosinophilia 03/03/2012    Past Surgical History  Procedure Laterality Date  . Tubal ligation      Family History  Problem Relation Age of Onset  .  Cancer Mother 53    cervical cancer  . Heart disease Father 50    MI  . Heart disease Brother 14    MI  . Diabetes Maternal Grandmother   . Diabetes Maternal Grandfather     Social History   Social History  . Marital Status: Married    Spouse Name: N/A  . Number of Children: N/A  . Years of Education: N/A   Occupational History  .      [employer]   Social History Main Topics  . Smoking status: Former Smoker -- 1.00 packs/day for 3 years    Types: Cigarettes  . Smokeless tobacco: Never Used     Comment: quit 2007  . Alcohol Use: 0.0 oz/week    0 Glasses of wine per week     Comment: occassionally  . Drug Use: No  . Sexual Activity: Not Currently   Other Topics Concern  . Not on file   Social History Narrative   The PMH, PSH, Social History, Family History, Medications, and allergies have been reviewed in [facility], and have been updated if relevant.   Review of Systems  Gastrointestinal: Positive for blood in stool. Negative for nausea, vomiting, abdominal pain, diarrhea,  constipation, abdominal distention, anal bleeding and rectal pain.  Hematological: Negative.   All other systems reviewed and are negative.      Objective:    BP 132/78 mmHg  Pulse 69  Temp(Src) 98.1 F (36.7 C) (Oral)  Wt 187 lb (84.823 kg)  SpO2 99%  LMP 12/26/2011   Physical Exam  Constitutional: She is oriented to person, place, and time. She appears well-developed and well-nourished. No distress.  HENT:  Head: Normocephalic.  Eyes: Conjunctivae are normal.  Cardiovascular: Normal rate.   Pulmonary/Chest: Effort normal.  Genitourinary: Rectum normal. Rectal exam shows no external hemorrhoid, no internal hemorrhoid, no fissure, no mass, no tenderness and anal tone normal. Guaiac negative stool.  Musculoskeletal: Normal range of motion.  Neurological: She is alert and oriented to person, place, and time. No cranial nerve deficit.  Skin: Skin is warm and dry. She is not  diaphoretic.  Psychiatric: She has a normal mood and affect. Her behavior is normal. Judgment and thought content normal.  Nursing note and vitals reviewed.         Assessment & Plan:   Rectal bleeding No Follow-up on file.

## 2015-05-27 NOTE — Progress Notes (Signed)
Pre visit review using our clinic review tool, if applicable. No additional management support is needed unless otherwise documented below in the visit note. 

## 2015-05-30 ENCOUNTER — Other Ambulatory Visit: Payer: Self-pay | Admitting: Family Medicine

## 2015-08-18 DIAGNOSIS — S90521A Blister (nonthermal), right ankle, initial encounter: Secondary | ICD-10-CM | POA: Diagnosis not present

## 2015-09-02 ENCOUNTER — Telehealth: Payer: Self-pay | Admitting: Family Medicine

## 2015-09-02 NOTE — Telephone Encounter (Signed)
Please call and scheduled CPE with fasting labs prior for Dr. Diona Browner.

## 2015-09-10 NOTE — Telephone Encounter (Signed)
Lab 9/29   cpx 10/3 Pt aware Please close

## 2015-09-26 ENCOUNTER — Other Ambulatory Visit: Payer: Self-pay | Admitting: Family Medicine

## 2015-11-15 ENCOUNTER — Telehealth: Payer: Self-pay | Admitting: Family Medicine

## 2015-11-15 ENCOUNTER — Other Ambulatory Visit (INDEPENDENT_AMBULATORY_CARE_PROVIDER_SITE_OTHER): Payer: BLUE CROSS/BLUE SHIELD

## 2015-11-15 DIAGNOSIS — Z1159 Encounter for screening for other viral diseases: Secondary | ICD-10-CM | POA: Diagnosis not present

## 2015-11-15 LAB — COMPREHENSIVE METABOLIC PANEL
ALT: 22 U/L (ref 0–35)
AST: 18 U/L (ref 0–37)
Albumin: 4.1 g/dL (ref 3.5–5.2)
Alkaline Phosphatase: 81 U/L (ref 39–117)
BUN: 14 mg/dL (ref 6–23)
CO2: 31 mEq/L (ref 19–32)
Calcium: 9.7 mg/dL (ref 8.4–10.5)
Chloride: 102 mEq/L (ref 96–112)
Creatinine, Ser: 0.95 mg/dL (ref 0.40–1.20)
GFR: 77.35 mL/min (ref >60.00)
Glucose, Bld: 105 mg/dL — ABNORMAL HIGH (ref 70–99)
Potassium: 3.9 mEq/L (ref 3.5–5.1)
Sodium: 139 mEq/L (ref 135–145)
Total Bilirubin: 0.4 mg/dL (ref 0.2–1.2)
Total Protein: 7.3 g/dL (ref 6.0–8.3)

## 2015-11-15 LAB — HEPATITIS C ANTIBODY: HCV Ab: NEGATIVE

## 2015-11-15 LAB — LIPID PANEL
Cholesterol: 231 mg/dL — ABNORMAL HIGH (ref 0–200)
HDL: 50.3 mg/dL (ref >39.00)
LDL Cholesterol: 157 mg/dL — ABNORMAL HIGH (ref 0–99)
NonHDL: 180.22
Total CHOL/HDL Ratio: 5
Triglycerides: 118 mg/dL (ref 0.0–149.0)
VLDL: 23.6 mg/dL (ref 0.0–40.0)

## 2015-11-15 NOTE — Telephone Encounter (Signed)
-----   Message from Ellamae Sia sent at 11/14/2015  4:43 PM EDT ----- Regarding: Lab orders for Friday, 9.1.17 Patient is scheduled for CPX labs, please order future labs, Thanks , Karna Christmas

## 2015-11-22 ENCOUNTER — Ambulatory Visit (INDEPENDENT_AMBULATORY_CARE_PROVIDER_SITE_OTHER): Payer: BLUE CROSS/BLUE SHIELD | Admitting: Family Medicine

## 2015-11-22 ENCOUNTER — Encounter: Payer: Self-pay | Admitting: Family Medicine

## 2015-11-22 VITALS — BP 140/72 | HR 52 | Temp 98.3°F | Ht 59.0 in | Wt 186.5 lb

## 2015-11-22 DIAGNOSIS — I10 Essential (primary) hypertension: Secondary | ICD-10-CM | POA: Diagnosis not present

## 2015-11-22 DIAGNOSIS — E7800 Pure hypercholesterolemia, unspecified: Secondary | ICD-10-CM | POA: Diagnosis not present

## 2015-11-22 DIAGNOSIS — M109 Gout, unspecified: Secondary | ICD-10-CM

## 2015-11-22 DIAGNOSIS — Z0000 Encounter for general adult medical examination without abnormal findings: Secondary | ICD-10-CM

## 2015-11-22 DIAGNOSIS — E669 Obesity, unspecified: Secondary | ICD-10-CM

## 2015-11-22 MED ORDER — PRAVASTATIN SODIUM 20 MG PO TABS: ORAL_TABLET | ORAL | 3 refills | Status: DC

## 2015-11-22 NOTE — Patient Instructions (Addendum)
Get back on track with healthy eating and regular exercise.   Look into Victoza or Byetta for weight loss and prediabetes. Return for lab only for chol check in 3 month.

## 2015-11-22 NOTE — Assessment & Plan Note (Signed)
Well controlled. Continue current medication. Encouraged exercise, weight loss, healthy eating habits.  

## 2015-11-22 NOTE — Assessment & Plan Note (Signed)
No flares in last year 

## 2015-11-22 NOTE — Assessment & Plan Note (Signed)
Try to increase dose of pravastain to 40 mg.. Gradually increase as tolerate.

## 2015-11-22 NOTE — Progress Notes (Signed)
Pre visit review using our clinic review tool, if applicable. No additional management support is needed unless otherwise documented below in the visit note. 

## 2015-11-22 NOTE — Progress Notes (Signed)
The patient is here for annual wellness exam and preventative care.  Doing well overall.   Hypertension:  Well controlled on current regimen. Tolerating losartan HCTZ BP Readings from Last 3 Encounters:  11/22/15 140/72  05/27/15 132/78  07/31/14 124/80  Chest pain with exertion:None Edema:None Short of breath:None Average home BPs: 135/70 Other issues:  Elevated Cholesterol:  Poor control  but much better than in past.  Has been on pravastatin   On co Q 10. Lab Results  Component Value Date   CHOL 231 (H) 11/15/2015   HDL 50.30 11/15/2015   LDLCALC 157 (H) 11/15/2015   TRIG 118.0 11/15/2015   CHOLHDL 5 11/15/2015  Using medications without problems: None Muscle aches: None Diet compliance:Moderate Exercise: joined [employer]. Other complaints   Wt Readings from Last 3 Encounters:  11/22/15 186 lb 8 oz (84.6 kg)  05/27/15 187 lb (84.8 kg)  07/31/14 180 lb 12 oz (82 kg)   Gout flares: rare flares. Uric acid improved at 6.5  Social History /Family History/Past Medical History reviewed and updated if needed.  Review of Systems  Constitutional: Negative for fever and fatigue.  HENT: Negative for ear pain.  Eyes: Negative for pain.  Respiratory: Negative for chest tightness and shortness of breath.  Cardiovascular: Negative for chest pain, palpitations and leg swelling.  Gastrointestinal: Negative for abdominal pain.  Genitourinary: Negative for dysuria.       Objective:   Physical Exam  Constitutional: Vital signs are normal. She appears well-developed and well-nourished. She is cooperative. Non-toxic appearance. She does not appear ill. No distress.  HENT:  Head: Normocephalic.  Right Ear: Hearing, tympanic membrane, external ear and ear canal normal.  Left Ear: Hearing, tympanic membrane, external ear and ear canal normal.  Nose: Nose normal.  Eyes: Conjunctivae, EOM and lids are normal. Pupils are equal, round, and reactive to light. Lids are  everted and swept, no foreign bodies found.  Neck: Trachea normal and normal range of motion. Neck supple. Carotid bruit is not present. No mass and no thyromegaly present.  Cardiovascular: Normal rate, regular rhythm, S1 normal, S2 normal, normal heart sounds and intact distal pulses. Exam reveals no gallop.  No murmur heard. Pulmonary/Chest: Effort normal and breath sounds normal. No respiratory distress. She has no wheezes. She has no rhonchi. She has no rales.  Abdominal: Soft. Normal appearance and bowel sounds are normal. She exhibits no distension, no fluid wave, no abdominal bruit and no mass. There is no hepatosplenomegaly. There is no tenderness. There is no rebound, no guarding and no CVA tenderness. No hernia.  Genitourinary: Vagina normal and uterus normal. No breast swelling, tenderness, discharge or bleeding. There is no rash, tenderness or lesion on the right labia. There is no rash, tenderness or lesion on the left labia. Uterus is not enlarged and not tender.  Right adnexum displays no mass, no tenderness and no fullness. Left adnexum displays no mass, no tenderness and no fullness.  NO PAP Lymphadenopathy:   She has no cervical adenopathy.   She has no axillary adenopathy.  Neurological: She is alert. She has normal strength. No cranial nerve deficit or sensory deficit.  Skin: Skin is warm, dry and intact. No rash noted.  Psychiatric: Her speech is normal and behavior is normal. Judgment normal. Her mood appears not anxious. Cognition and memory are normal. She does not exhibit a depressed mood.   Body mass index is 37.67 kg/m.         Assessment & Plan:  The  patient's preventative maintenance and recommended screening tests for an annual wellness exam were reviewed in full today. Brought up to date unless services declined.  Counselled on the importance of diet, exercise, and its role in overall health and mortality. The patient's FH and SH was reviewed,  including their home life, tobacco status, and drug and alcohol status.   Vaccines:uptodate, plan flu Colon: Nml 2012, plan repeat in 2022 Mammo:10/2014 nml, plan repeat in 2 years PAP/DVE:  Nml, neg HPV 06/2013, repeat in 2020  no history of abnormals. DVE yearly. Nonsmoker  Hep C: done  HIV: None

## 2015-11-22 NOTE — Assessment & Plan Note (Addendum)
Counseled on weight loss, health eating. Discussed med options. Will look into Victoza.

## 2015-11-27 ENCOUNTER — Other Ambulatory Visit: Payer: Self-pay | Admitting: Family Medicine

## 2015-12-13 ENCOUNTER — Other Ambulatory Visit: Payer: BLUE CROSS/BLUE SHIELD

## 2015-12-17 ENCOUNTER — Encounter: Payer: BLUE CROSS/BLUE SHIELD | Admitting: Family Medicine

## 2015-12-31 ENCOUNTER — Ambulatory Visit (INDEPENDENT_AMBULATORY_CARE_PROVIDER_SITE_OTHER): Payer: BLUE CROSS/BLUE SHIELD

## 2015-12-31 DIAGNOSIS — Z23 Encounter for immunization: Secondary | ICD-10-CM | POA: Diagnosis not present

## 2016-02-18 ENCOUNTER — Telehealth: Payer: Self-pay | Admitting: Family Medicine

## 2016-02-18 DIAGNOSIS — E7800 Pure hypercholesterolemia, unspecified: Secondary | ICD-10-CM

## 2016-02-18 DIAGNOSIS — M1A9XX0 Chronic gout, unspecified, without tophus (tophi): Secondary | ICD-10-CM

## 2016-02-18 NOTE — Telephone Encounter (Signed)
-----   Message from Ellamae Sia sent at 02/13/2016  9:55 AM EST ----- Regarding: Lab orders for Friday, 12.8.17 Lab orders for a 3 month follow up appt.

## 2016-02-21 ENCOUNTER — Ambulatory Visit: Payer: BLUE CROSS/BLUE SHIELD | Admitting: Family Medicine

## 2016-02-21 ENCOUNTER — Other Ambulatory Visit (INDEPENDENT_AMBULATORY_CARE_PROVIDER_SITE_OTHER): Payer: BLUE CROSS/BLUE SHIELD

## 2016-02-21 DIAGNOSIS — E7800 Pure hypercholesterolemia, unspecified: Secondary | ICD-10-CM

## 2016-02-21 LAB — COMPREHENSIVE METABOLIC PANEL
ALT: 30 U/L (ref 0–35)
AST: 22 U/L (ref 0–37)
Albumin: 4.5 g/dL (ref 3.5–5.2)
Alkaline Phosphatase: 78 U/L (ref 39–117)
BUN: 10 mg/dL (ref 6–23)
CO2: 32 mEq/L (ref 19–32)
Calcium: 10.3 mg/dL (ref 8.4–10.5)
Chloride: 99 mEq/L (ref 96–112)
Creatinine, Ser: 0.92 mg/dL (ref 0.40–1.20)
GFR: 80.2 mL/min (ref >60.00)
Glucose, Bld: 96 mg/dL (ref 70–99)
Potassium: 3.8 mEq/L (ref 3.5–5.1)
Sodium: 138 mEq/L (ref 135–145)
Total Bilirubin: 0.5 mg/dL (ref 0.2–1.2)
Total Protein: 7.9 g/dL (ref 6.0–8.3)

## 2016-02-21 LAB — LIPID PANEL
Cholesterol: 216 mg/dL — ABNORMAL HIGH (ref 0–200)
HDL: 50.2 mg/dL (ref >39.00)
LDL Cholesterol: 142 mg/dL — ABNORMAL HIGH (ref 0–99)
NonHDL: 165.44
Total CHOL/HDL Ratio: 4
Triglycerides: 117 mg/dL (ref 0.0–149.0)
VLDL: 23.4 mg/dL (ref 0.0–40.0)

## 2016-02-27 ENCOUNTER — Encounter: Payer: Self-pay | Admitting: Family Medicine

## 2016-02-27 ENCOUNTER — Ambulatory Visit (INDEPENDENT_AMBULATORY_CARE_PROVIDER_SITE_OTHER): Payer: BLUE CROSS/BLUE SHIELD | Admitting: Family Medicine

## 2016-02-27 VITALS — BP 140/80 | HR 67 | Temp 98.6°F | Ht 59.0 in | Wt 183.2 lb

## 2016-02-27 DIAGNOSIS — E669 Obesity, unspecified: Secondary | ICD-10-CM | POA: Diagnosis not present

## 2016-02-27 DIAGNOSIS — E7800 Pure hypercholesterolemia, unspecified: Secondary | ICD-10-CM

## 2016-02-27 DIAGNOSIS — I10 Essential (primary) hypertension: Secondary | ICD-10-CM

## 2016-02-27 NOTE — Progress Notes (Signed)
   Subjective:    Patient ID: Natalie Rice, female    DOB: [birth year], 60 y.o.   MRN: [id number]  HPI  60 year old female presents for 3 month follow up on weight management and hypercholesterolemia.  Elevated Cholesterol: At last OV recommended increase in dose of pravastatin to 40 mg daily. LDL has dropped from 157 to 142 CMET( AST/ALT) stable. Lab Results  Component Value Date   CHOL 216 (H) 02/21/2016   HDL 50.20 02/21/2016   LDLCALC 142 (H) 02/21/2016   TRIG 117.0 02/21/2016   CHOLHDL 4 02/21/2016  Using medications without problems: None Muscle aches: None Diet compliance:Has decreased portion size Exercise: None. Other complaints:  Hypertension:   Borderline control in office today. Had cup of coffee before came today.  on losartan HCTZ.  Using medication without problems or lightheadedness:  None Chest pain with exertion:None Edema:None Short of breath:None Average home BPs:  Not checking. Other issues: BP Readings from Last 3 Encounters:  02/27/16 140/80  11/22/15 140/72  05/27/15 132/78    Wt Readings from Last 3 Encounters:  02/27/16 183 lb 4 oz (83.1 kg)  11/22/15 186 lb 8 oz (84.6 kg)  05/27/15 187 lb (84.8 kg)  Body mass index is 37.01 kg/m.     Review of Systems  Constitutional: Negative for fatigue and fever.  HENT: Negative for ear pain.   Eyes: Negative for pain.  Respiratory: Negative for chest tightness and shortness of breath.   Cardiovascular: Negative for chest pain, palpitations and leg swelling.  Gastrointestinal: Negative for abdominal pain.  Genitourinary: Negative for dysuria.       Objective:   Physical Exam  Constitutional: Vital signs are normal. She appears well-developed and well-nourished. She is cooperative.  Non-toxic appearance. She does not appear ill. No distress.  HENT:  Head: Normocephalic.  Right Ear: Hearing, tympanic membrane, external ear and ear canal normal. Tympanic membrane is not erythematous, not  retracted and not bulging.  Left Ear: Hearing, tympanic membrane, external ear and ear canal normal. Tympanic membrane is not erythematous, not retracted and not bulging.  Nose: No mucosal edema or rhinorrhea. Right sinus exhibits no maxillary sinus tenderness and no frontal sinus tenderness. Left sinus exhibits no maxillary sinus tenderness and no frontal sinus tenderness.  Mouth/Throat: Uvula is midline, oropharynx is clear and moist and mucous membranes are normal.  Eyes: Conjunctivae, EOM and lids are normal. Pupils are equal, round, and reactive to light. Lids are everted and swept, no foreign bodies found.  Neck: Trachea normal and normal range of motion. Neck supple. Carotid bruit is not present. No thyroid mass and no thyromegaly present.  Cardiovascular: Normal rate, regular rhythm, S1 normal, S2 normal, normal heart sounds, intact distal pulses and normal pulses.  Exam reveals no gallop and no friction rub.   No murmur heard. Pulmonary/Chest: Effort normal and breath sounds normal. No tachypnea. No respiratory distress. She has no decreased breath sounds. She has no wheezes. She has no rhonchi. She has no rales.  Abdominal: Soft. Normal appearance and bowel sounds are normal. There is no tenderness.  Neurological: She is alert.  Skin: Skin is warm, dry and intact. No rash noted.  Psychiatric: Her speech is normal and behavior is normal. Judgment and thought content normal. Her mood appears not anxious. Cognition and memory are normal. She does not exhibit a depressed mood.          Assessment & Plan:

## 2016-02-27 NOTE — Assessment & Plan Note (Signed)
Pt not currently interested in weight loss medication such as liraglutide. She will  Continue to work on lifestyle changes.

## 2016-02-27 NOTE — Assessment & Plan Note (Signed)
Improved control on higher dose of pravastatin but not yet at goal. Pt will continue working on healthy eating and low chol diet.

## 2016-02-27 NOTE — Assessment & Plan Note (Signed)
Will follow at home. Encouraged exercise, weight loss, healthy eating habits.

## 2016-02-27 NOTE — Patient Instructions (Addendum)
Continue working on decreased portion size. Low chol and low carb.  Start regular exercise (150 min week).  Follow BP at home, call if > 140/90 consistently.

## 2016-03-18 ENCOUNTER — Telehealth: Payer: Self-pay | Admitting: *Deleted

## 2016-03-18 MED ORDER — PRAVASTATIN SODIUM 40 MG PO TABS: 40.0000 mg | ORAL_TABLET | Freq: Every day | ORAL | 3 refills | Status: DC

## 2016-03-18 NOTE — Telephone Encounter (Signed)
Received fax from [facility] requesting PA for pravastatin 20 mg.  Patient is currently take 2 tablets (40mg ) daily.  PA is due to quantity limit.  Spoke with patient.  She is tolerating taking 40 mg daily.  Will change to Pravastatin 40 mg one tablet daily,  which should not require PA.  New prescription sent to [facility] mail order.

## 2016-05-25 ENCOUNTER — Other Ambulatory Visit: Payer: Self-pay | Admitting: *Deleted

## 2016-05-25 MED ORDER — LOSARTAN POTASSIUM-HCTZ 50-12.5 MG PO TABS: ORAL_TABLET | ORAL | 1 refills | Status: DC

## 2016-05-26 DIAGNOSIS — H524 Presbyopia: Secondary | ICD-10-CM | POA: Diagnosis not present

## 2016-11-03 ENCOUNTER — Other Ambulatory Visit: Payer: Self-pay | Admitting: Family Medicine

## 2016-11-20 ENCOUNTER — Telehealth: Payer: Self-pay | Admitting: Family Medicine

## 2016-11-20 DIAGNOSIS — E7800 Pure hypercholesterolemia, unspecified: Secondary | ICD-10-CM

## 2016-11-20 DIAGNOSIS — M1000 Idiopathic gout, unspecified site: Secondary | ICD-10-CM

## 2016-11-20 NOTE — Telephone Encounter (Signed)
-----   Message from Ellamae Sia sent at 11/13/2016  9:23 AM EDT ----- Regarding: Lab orders for Monday 9.10.18 Patient is scheduled for CPX labs, please order future labs, Thanks , Karna Christmas

## 2016-11-23 ENCOUNTER — Other Ambulatory Visit: Payer: BLUE CROSS/BLUE SHIELD

## 2016-11-25 ENCOUNTER — Other Ambulatory Visit (INDEPENDENT_AMBULATORY_CARE_PROVIDER_SITE_OTHER): Payer: BLUE CROSS/BLUE SHIELD

## 2016-11-25 DIAGNOSIS — E7800 Pure hypercholesterolemia, unspecified: Secondary | ICD-10-CM | POA: Diagnosis not present

## 2016-11-25 DIAGNOSIS — M1000 Idiopathic gout, unspecified site: Secondary | ICD-10-CM | POA: Diagnosis not present

## 2016-11-26 LAB — LIPID PANEL
Cholesterol: 220 mg/dL — ABNORMAL HIGH (ref 0–200)
HDL: 67.9 mg/dL (ref >39.00)
LDL Cholesterol: 127 mg/dL — ABNORMAL HIGH (ref 0–99)
NonHDL: 151.77
Total CHOL/HDL Ratio: 3
Triglycerides: 124 mg/dL (ref 0.0–149.0)
VLDL: 24.8 mg/dL (ref 0.0–40.0)

## 2016-11-26 LAB — COMPREHENSIVE METABOLIC PANEL
ALT: 33 U/L (ref 0–35)
AST: 26 U/L (ref 0–37)
Albumin: 4.3 g/dL (ref 3.5–5.2)
Alkaline Phosphatase: 82 U/L (ref 39–117)
BUN: 15 mg/dL (ref 6–23)
CO2: 28 mEq/L (ref 19–32)
Calcium: 10.7 mg/dL — ABNORMAL HIGH (ref 8.4–10.5)
Chloride: 99 mEq/L (ref 96–112)
Creatinine, Ser: 0.97 mg/dL (ref 0.40–1.20)
GFR: 75.25 mL/min (ref >60.00)
Glucose, Bld: 77 mg/dL (ref 70–99)
Potassium: 4 mEq/L (ref 3.5–5.1)
Sodium: 136 mEq/L (ref 135–145)
Total Bilirubin: 0.4 mg/dL (ref 0.2–1.2)
Total Protein: 7.5 g/dL (ref 6.0–8.3)

## 2016-11-26 LAB — URIC ACID: Uric Acid, Serum: 6.1 mg/dL (ref 2.4–7.0)

## 2016-11-27 ENCOUNTER — Encounter: Payer: BLUE CROSS/BLUE SHIELD | Admitting: Family Medicine

## 2016-12-03 ENCOUNTER — Encounter: Payer: Self-pay | Admitting: Family Medicine

## 2016-12-03 ENCOUNTER — Ambulatory Visit (INDEPENDENT_AMBULATORY_CARE_PROVIDER_SITE_OTHER): Payer: BLUE CROSS/BLUE SHIELD | Admitting: Family Medicine

## 2016-12-03 VITALS — BP 158/80 | HR 68 | Temp 98.9°F | Ht 59.0 in | Wt 190.0 lb

## 2016-12-03 DIAGNOSIS — M1000 Idiopathic gout, unspecified site: Secondary | ICD-10-CM | POA: Diagnosis not present

## 2016-12-03 DIAGNOSIS — I10 Essential (primary) hypertension: Secondary | ICD-10-CM

## 2016-12-03 DIAGNOSIS — E669 Obesity, unspecified: Secondary | ICD-10-CM | POA: Diagnosis not present

## 2016-12-03 DIAGNOSIS — E7800 Pure hypercholesterolemia, unspecified: Secondary | ICD-10-CM | POA: Diagnosis not present

## 2016-12-03 DIAGNOSIS — Z0000 Encounter for general adult medical examination without abnormal findings: Secondary | ICD-10-CM | POA: Diagnosis not present

## 2016-12-03 NOTE — Assessment & Plan Note (Signed)
Improving control on pravastatin 40 mg daily.

## 2016-12-03 NOTE — Assessment & Plan Note (Signed)
Encouraged exercise, weight loss, healthy eating habits. ? ?

## 2016-12-03 NOTE — Patient Instructions (Addendum)
   Increase losartan HCTZ to 2 tabs daily.. If tolerating and BP better in 2 weeks we can send in mail order 90 day supply. In next 2 weeks.. Check blood pressure daily... Call with measurements. Goal at least < 140/90.  Work on getting back exercise, decreased portion size etc.  Call to schedule mammogram on own.

## 2016-12-03 NOTE — Assessment & Plan Note (Signed)
No flares since 2014

## 2016-12-03 NOTE — Progress Notes (Signed)
Subjective:    Patient ID: Natalie Rice, female    DOB: Dec 11, 1955, 61 y.o.   MRN: [id number]  HPI   The patient is here for annual wellness exam and preventative care.    Hypertension:  Not at goal on losartan HCTZ.  She did feel anxious  BP Readings from Last 3 Encounters:  12/03/16 (!) 158/80  02/27/16 140/80  11/22/15 140/72  Using medication without problems or lightheadedness:  none Chest pain with exertion: none Edema: none Short of breath: none Average home BPs: Other issues:  Elevated Cholesterol:  Pravastatin 40 mg daily, omega threes Lab Results  Component Value Date   CHOL 220 (H) 11/25/2016   HDL 67.90 11/25/2016   LDLCALC 127 (H) 11/25/2016   TRIG 124.0 11/25/2016   CHOLHDL 3 11/25/2016  Using medications without problems: none Muscle aches:none  Diet compliance:  Moderate..eating more at work Exercise: none Other complaints:   Calcium is high.. She is taking 2 ca suppleemtns   Body mass index is 38.38 kg/m.  Wt Readings from Last 3 Encounters:  12/03/16 190 lb (86.2 kg)  02/27/16 183 lb 4 oz (83.1 kg)  11/22/15 186 lb 8 oz (84.6 kg)      Gout: no flares since 2014 uric acid nml.  Social History /Family History/Past Medical History reviewed in detail and updated in EMR if needed. Blood pressure (!) 158/80, pulse 68, temperature 98.9 F (37.2 C), temperature source Oral, height 4\' 11"  (1.499 m), weight 190 lb (86.2 kg), last menstrual period 12/26/2011.  Review of Systems  Constitutional: Negative for fatigue and fever.  HENT: Negative for congestion and ear pain.   Eyes: Negative for pain.  Respiratory: Negative for cough, chest tightness and shortness of breath.   Cardiovascular: Negative for chest pain, palpitations and leg swelling.  Gastrointestinal: Negative for abdominal pain.  Genitourinary: Negative for dysuria and vaginal bleeding.  Musculoskeletal: Negative for back pain.  Neurological: Negative for syncope, light-headedness  and headaches.  Psychiatric/Behavioral: Negative for dysphoric mood.       Objective:   Physical Exam  Constitutional: Vital signs are normal. She appears well-developed and well-nourished. She is cooperative.  Non-toxic appearance. She does not appear ill. No distress.  HENT:  Head: Normocephalic.  Right Ear: Hearing, tympanic membrane, external ear and ear canal normal.  Left Ear: Hearing, tympanic membrane, external ear and ear canal normal.  Nose: Nose normal.  Eyes: Pupils are equal, round, and reactive to light. Conjunctivae, EOM and lids are normal. Lids are everted and swept, no foreign bodies found.  Neck: Trachea normal and normal range of motion. Neck supple. Carotid bruit is not present. No thyroid mass and no thyromegaly present.  Cardiovascular: Normal rate, regular rhythm, S1 normal, S2 normal, normal heart sounds and intact distal pulses.  Exam reveals no gallop.   No murmur heard. Pulmonary/Chest: Effort normal and breath sounds normal. No respiratory distress. She has no wheezes. She has no rhonchi. She has no rales.  Abdominal: Soft. Normal appearance and bowel sounds are normal. She exhibits no distension, no fluid wave, no abdominal bruit and no mass. There is no hepatosplenomegaly. There is no tenderness. There is no rebound, no guarding and no CVA tenderness. No hernia.  Lymphadenopathy:    She has no cervical adenopathy.    She has no axillary adenopathy.  Neurological: She is alert. She has normal strength. No cranial nerve deficit or sensory deficit.  Skin: Skin is warm, dry and intact. No rash noted.  Psychiatric:  Her speech is normal and behavior is normal. Judgment normal. Her mood appears not anxious. Cognition and memory are normal. She does not exhibit a depressed mood.          Assessment & Plan:  The patient's preventative maintenance and recommended screening tests for an annual wellness exam were reviewed in full today. Brought up to date unless  services declined.  Counselled on the importance of diet, exercise, and its role in overall health and mortality. The patient's FH and SH was reviewed, including their home life, tobacco status, and drug and alcohol status.   Vaccines:uptodate, plan flu Colon: Nml 2012, plan repeat in 2022 Mammo:10/2014 nml, plan repeat in 2 years, due now PAP/DVE: Nml, neg HPV 06/2013, repeat in 2020 no history of abnormals.  Nonsmoker Hep C: done HIV: None

## 2016-12-03 NOTE — Assessment & Plan Note (Signed)
Inadequate control.. Partly white coat HTN. Increase BP medication and follow BPs at home. Encouraged exercise, weight loss, healthy eating habits.

## 2016-12-04 ENCOUNTER — Telehealth: Payer: Self-pay

## 2016-12-04 NOTE — Telephone Encounter (Signed)
Pt left v/m; pt was seen 12/03/16 and was advised to take 2 tabs of losartan HCTZ 50-12.5 mg and see how BP does. Pt has had time to think and is concerned that that might be too strong for pt and pt wants to know if Dr Diona Browner thinks OK to take 1 1/2 tabs of losartan HCTZ 50 - 12.5 mg to see how pt does with BP readings. Pt request cb.

## 2016-12-04 NOTE — Telephone Encounter (Signed)
If pt able to cut tab in half she can take 1.5 tabs daily first.

## 2016-12-07 NOTE — Telephone Encounter (Signed)
Left message for Mega that If she is able to cut tab in half, she can take 1.5 tabs daily first per Dr. Diona Browner.

## 2016-12-18 NOTE — Telephone Encounter (Signed)
BP sounds much better.Faythe Ghee to stay at current dose.Continue to follow

## 2016-12-18 NOTE — Telephone Encounter (Signed)
Pt called and pt has been watching what she ate and BP has been doing better; BP has been running from 116/76 on 12/05/16 to the higher reading of 124/69 on 12/06/16 and the last reading was 12/12/16 BP 116/82. Pt does not want to chg BP med dosage at this time; pt will continue what doing until receives cb next week.

## 2016-12-21 NOTE — Telephone Encounter (Signed)
Left message for Arlene that BP sounds much better.Faythe Ghee to stay at current dose. Continue to follow BPs at home per Dr. Diona Browner.

## 2016-12-31 ENCOUNTER — Other Ambulatory Visit: Payer: BLUE CROSS/BLUE SHIELD

## 2017-01-13 ENCOUNTER — Ambulatory Visit (INDEPENDENT_AMBULATORY_CARE_PROVIDER_SITE_OTHER): Payer: BLUE CROSS/BLUE SHIELD

## 2017-01-13 DIAGNOSIS — Z23 Encounter for immunization: Secondary | ICD-10-CM

## 2017-02-07 ENCOUNTER — Other Ambulatory Visit: Payer: Self-pay | Admitting: Family Medicine

## 2017-02-17 ENCOUNTER — Telehealth: Payer: Self-pay | Admitting: Family Medicine

## 2017-02-17 MED ORDER — LOSARTAN POTASSIUM-HCTZ 50-12.5 MG PO TABS: 1.0000 | ORAL_TABLET | Freq: Every day | ORAL | 1 refills | Status: DC

## 2017-02-17 NOTE — Telephone Encounter (Addendum)
Per phone note on 12/04/2016, Dr. Diona Browner stated that patient could stay on previous dose of her Losartan-HCTZ which was 50-12.5mg .  Rx sent to [facility] for that dose.  Left message for Lashara that I have sent in a refill for the 50-12.5 mg tablets to [facility] in [place].

## 2017-02-17 NOTE — Telephone Encounter (Signed)
Copied from [place] [id number]. Topic: Quick Communication - Rx Refill/Question >> Feb 17, 2017  5:37 PM Marin Olp L wrote: Has the patient contacted their pharmacy? Yes.   (Agent: If no, request that the patient contact the pharmacy for the refill.) Preferred Pharmacy (with phone number or street name): [facility] Agent: Please be advised that RX refills may take up to 3 business days. We ask that you follow-up with your pharmacy. [facility]. Not able [facility]. Please Fax:[phone]

## 2017-02-17 NOTE — Telephone Encounter (Signed)
Pt given information about medication per notes of Donna,CMA. Pt states that the medication should have been sent to [facility]. Advised pt to call [facility] to see if [facility]. Advised pt to call back if not able to get medication.

## 2017-02-17 NOTE — Telephone Encounter (Signed)
Copied from [place]. Topic: Quick Communication - See Telephone Encounter >> Feb 17, 2017  4:42 PM [name], NT wrote: CRM for notification. See Telephone encounter for:   02/17/17. Pt. Called and tried to get refill on b/p med. (losartan-hydrochlorothiazide) But pharmacy said it wasn't the same dosage. Pt. Said that the med. Is working and it was supposed to stay at  50mg  not 100. Please give pt a call. Pt. Natalie Rice its ok to leave vm. Pt. Can be reached at [phone] (pt. Is almost out of meds. )

## 2017-02-18 MED ORDER — LOSARTAN POTASSIUM-HCTZ 50-12.5 MG PO TABS: 1.0000 | ORAL_TABLET | Freq: Every day | ORAL | 1 refills | Status: DC

## 2017-02-18 NOTE — Telephone Encounter (Signed)
[facility] sent to [facility] to prevent delay. Message left to cancel order at [facility]

## 2017-02-18 NOTE — Telephone Encounter (Signed)
Called pt. to verify that the medication refill on Losartan did get sent to [facility].  Pt. said that [facility] is her preference for all her prescriptions, and that [facility] only [facility] [place] if needs something to be filled quickly.  Advised pt. that the order of preference, for her pharmacies, was changed on her chart.

## 2017-02-18 NOTE — Addendum Note (Signed)
Addended by: Modena Nunnery on: 02/18/2017 08:22 AM   Modules accepted: Orders

## 2017-08-16 ENCOUNTER — Other Ambulatory Visit: Payer: Self-pay | Admitting: Family Medicine

## 2017-11-02 ENCOUNTER — Other Ambulatory Visit: Payer: Self-pay | Admitting: Family Medicine

## 2017-12-30 ENCOUNTER — Ambulatory Visit (INDEPENDENT_AMBULATORY_CARE_PROVIDER_SITE_OTHER): Payer: BLUE CROSS/BLUE SHIELD

## 2017-12-30 DIAGNOSIS — Z23 Encounter for immunization: Secondary | ICD-10-CM | POA: Diagnosis not present

## 2018-01-19 ENCOUNTER — Telehealth: Payer: Self-pay | Admitting: Family Medicine

## 2018-01-19 NOTE — Telephone Encounter (Signed)
Left message asking pt to call office  °

## 2018-01-19 NOTE — Telephone Encounter (Signed)
Labs 12/16 cpx 12/20 Pt aware Pt wanted to make sure her meds will be refill till she can come in for cpx

## 2018-01-19 NOTE — Telephone Encounter (Signed)
Refill sent to [facility].

## 2018-01-19 NOTE — Telephone Encounter (Signed)
Natalie Rice, please schedule CPE with fasting labs prior.  Must schedule appointment before his medication can be refilled.  Please send back to Donna once appointment is made for her to refill. Thanks!  

## 2018-01-19 NOTE — Telephone Encounter (Signed)
Last office visit 12/03/2016.  Last refilled 11/02/2017 for #90 with no refills.  Note on refills states needs CPE and labs for refills after this one.  No future appointments.  Refill?

## 2018-02-16 ENCOUNTER — Other Ambulatory Visit: Payer: Self-pay | Admitting: Family Medicine

## 2018-02-28 ENCOUNTER — Telehealth: Payer: Self-pay | Admitting: Family Medicine

## 2018-02-28 ENCOUNTER — Other Ambulatory Visit (INDEPENDENT_AMBULATORY_CARE_PROVIDER_SITE_OTHER): Payer: BLUE CROSS/BLUE SHIELD

## 2018-02-28 DIAGNOSIS — M1000 Idiopathic gout, unspecified site: Secondary | ICD-10-CM | POA: Diagnosis not present

## 2018-02-28 DIAGNOSIS — E7800 Pure hypercholesterolemia, unspecified: Secondary | ICD-10-CM

## 2018-02-28 LAB — COMPREHENSIVE METABOLIC PANEL
ALT: 24 U/L (ref 0–35)
AST: 17 U/L (ref 0–37)
Albumin: 4.1 g/dL (ref 3.5–5.2)
Alkaline Phosphatase: 70 U/L (ref 39–117)
BUN: 15 mg/dL (ref 6–23)
CO2: 29 mEq/L (ref 19–32)
Calcium: 9.8 mg/dL (ref 8.4–10.5)
Chloride: 101 mEq/L (ref 96–112)
Creatinine, Ser: 0.97 mg/dL (ref 0.40–1.20)
GFR: 74.94 mL/min (ref >60.00)
Glucose, Bld: 113 mg/dL — ABNORMAL HIGH (ref 70–99)
Potassium: 3.5 mEq/L (ref 3.5–5.1)
Sodium: 139 mEq/L (ref 135–145)
Total Bilirubin: 0.4 mg/dL (ref 0.2–1.2)
Total Protein: 6.8 g/dL (ref 6.0–8.3)

## 2018-02-28 LAB — LIPID PANEL
Cholesterol: 218 mg/dL — ABNORMAL HIGH (ref 0–200)
HDL: 50.3 mg/dL (ref >39.00)
LDL Cholesterol: 141 mg/dL — ABNORMAL HIGH (ref 0–99)
NonHDL: 167.53
Total CHOL/HDL Ratio: 4
Triglycerides: 135 mg/dL (ref 0.0–149.0)
VLDL: 27 mg/dL (ref 0.0–40.0)

## 2018-02-28 LAB — URIC ACID: Uric Acid, Serum: 6.9 mg/dL (ref 2.4–7.0)

## 2018-02-28 NOTE — Telephone Encounter (Signed)
-----   Message from Lendon Collar, RT sent at 02/22/2018  8:11 AM EST ----- Regarding: Lab orders for Monday Dec 16th Please enter CPE lab orders for 02/28/18. Thanks!

## 2018-03-04 ENCOUNTER — Encounter: Payer: Self-pay | Admitting: Family Medicine

## 2018-03-04 ENCOUNTER — Ambulatory Visit (INDEPENDENT_AMBULATORY_CARE_PROVIDER_SITE_OTHER): Payer: BLUE CROSS/BLUE SHIELD | Admitting: Family Medicine

## 2018-03-04 VITALS — BP 138/86 | HR 69 | Temp 98.7°F | Ht 59.5 in | Wt 183.0 lb

## 2018-03-04 DIAGNOSIS — Z0000 Encounter for general adult medical examination without abnormal findings: Secondary | ICD-10-CM | POA: Diagnosis not present

## 2018-03-04 DIAGNOSIS — Z1231 Encounter for screening mammogram for malignant neoplasm of breast: Secondary | ICD-10-CM

## 2018-03-04 DIAGNOSIS — I10 Essential (primary) hypertension: Secondary | ICD-10-CM | POA: Diagnosis not present

## 2018-03-04 DIAGNOSIS — E7800 Pure hypercholesterolemia, unspecified: Secondary | ICD-10-CM

## 2018-03-04 DIAGNOSIS — E66812 Obesity, class 2: Secondary | ICD-10-CM

## 2018-03-04 DIAGNOSIS — E669 Obesity, unspecified: Secondary | ICD-10-CM

## 2018-03-04 DIAGNOSIS — M1000 Idiopathic gout, unspecified site: Secondary | ICD-10-CM

## 2018-03-04 NOTE — Patient Instructions (Addendum)
Get back to low carb , low fat/cholesterol diet. Less animal  Fats, more veggie fats. Avoid shrimp for gout prevention.  Goal for exercise 150 min per week ( 3-5 days)  Can use magnesium 300 mg daily. Please stop at the front desk to set up referral.

## 2018-03-04 NOTE — Assessment & Plan Note (Signed)
Encouraged exercise, weight loss, healthy eating habits. ? ?

## 2018-03-04 NOTE — Assessment & Plan Note (Signed)
Well controlled. Continue current medication. Encouraged exercise, weight loss, healthy eating habits.  

## 2018-03-04 NOTE — Assessment & Plan Note (Signed)
INadequate control  On statin but pt not taking regularly and poor diet. Get back on track and re-evaluate in 1 year.

## 2018-03-04 NOTE — Assessment & Plan Note (Signed)
Work on low uric acid diet, but no flares.

## 2018-03-04 NOTE — Progress Notes (Signed)
Subjective:    Patient ID: Natalie Rice, female    DOB: [birth year], 62 y.o.   MRN: [id number]  HPI The patient is here for annual wellness exam and preventative care.    Hypertension:   At goal on losartan HCTZ. Using medication without problems or lightheadedness:  none Chest pain with exertion: none Edema:none Short of breath:none Average home BPs: Other issues:    Elevated Cholesterol: LDL above goal on  Pravastatin 40 mg daily, omega threes.   She has not been as consistent with taking her medicaiton, but is getting back to this. Lab Results  Component Value Date   CHOL 218 (H) 02/28/2018   HDL 50.30 02/28/2018   LDLCALC 141 (H) 02/28/2018   TRIG 135.0 02/28/2018   CHOLHDL 4 02/28/2018  Using medications without problems:none Muscle aches:none  Diet compliance: poor Exercise: none Other complaints:    Wt Readings from Last 3 Encounters:  03/04/18 183 lb (83 kg)  12/03/16 190 lb (86.2 kg)  02/27/16 183 lb 4 oz (83.1 kg)    Prediabetes ... poor diet   Gout:  No flares. Lab Results  Component Value Date   LABURIC 6.9 02/28/2018     Social History /Family History/Past Medical History reviewed in detail and updated in EMR if needed. Blood pressure 138/86, pulse 69, temperature 98.7 F (37.1 C), temperature source Oral, height 4' 11.5" (1.511 m), weight 183 lb (83 kg), last menstrual period 12/26/2011, SpO2 99 %.  Review of Systems  Constitutional: Negative for fatigue and fever.  HENT: Negative for congestion.   Eyes: Negative for pain.  Respiratory: Negative for cough and shortness of breath.   Cardiovascular: Negative for chest pain, palpitations and leg swelling.  Gastrointestinal: Negative for abdominal pain.  Genitourinary: Negative for dysuria and vaginal bleeding.  Musculoskeletal: Negative for back pain.  Neurological: Negative for syncope, light-headedness and headaches.  Psychiatric/Behavioral: Negative for dysphoric mood.         Objective:   Physical Exam Constitutional:      General: She is not in acute distress.    Appearance: Normal appearance. She is well-developed. She is obese. She is not ill-appearing or toxic-appearing.  HENT:     Head: Normocephalic.     Right Ear: Hearing, tympanic membrane, ear canal and external ear normal.     Left Ear: Hearing, tympanic membrane, ear canal and external ear normal.     Nose: Nose normal.  Eyes:     General: Lids are normal. Lids are everted, no foreign bodies appreciated.     Conjunctiva/sclera: Conjunctivae normal.     Pupils: Pupils are equal, round, and reactive to light.  Neck:     Musculoskeletal: Normal range of motion and neck supple.     Thyroid: No thyroid mass or thyromegaly.     Vascular: No carotid bruit.     Trachea: Trachea normal.  Cardiovascular:     Rate and Rhythm: Normal rate and regular rhythm.     Heart sounds: Normal heart sounds, S1 normal and S2 normal. No murmur. No gallop.   Pulmonary:     Effort: Pulmonary effort is normal. No respiratory distress.     Breath sounds: Normal breath sounds. No wheezing, rhonchi or rales.  Abdominal:     General: Bowel sounds are normal. There is no distension or abdominal bruit.     Palpations: Abdomen is soft. There is no fluid wave or mass.     Tenderness: There is no abdominal tenderness. There is no  guarding or rebound.     Hernia: No hernia is present.  Lymphadenopathy:     Cervical: No cervical adenopathy.  Skin:    General: Skin is warm and dry.     Findings: No rash.  Neurological:     Mental Status: She is alert.     Cranial Nerves: No cranial nerve deficit.     Sensory: No sensory deficit.  Psychiatric:        Mood and Affect: Mood is not anxious or depressed.        Speech: Speech normal.        Behavior: Behavior normal. Behavior is cooperative.        Judgment: Judgment normal.           Assessment & Plan:  The patient's preventative maintenance and recommended screening  tests for an annual wellness exam were reviewed in full today. Brought up to date unless services declined.  Counselled on the importance of diet, exercise, and its role in overall health and mortality. The patient's FH and SH was reviewed, including their home life, tobacco status, and drug and alcohol status.   Vaccines:uptodate with td and flu Colon: Nml 2012, plan repeat in 2022 Mammo:8/2016nml, plan repeat in 2 years, due now PAP/DVE: Nml, neg HPV4/2015, repeat in 2020no history of abnormals.   Bone density: low risk start age 40. Nonsmoker Hep C: done HIV:  refused

## 2018-03-21 DIAGNOSIS — Z1231 Encounter for screening mammogram for malignant neoplasm of breast: Secondary | ICD-10-CM | POA: Diagnosis not present

## 2018-03-21 DIAGNOSIS — Z803 Family history of malignant neoplasm of breast: Secondary | ICD-10-CM | POA: Diagnosis not present

## 2018-04-06 ENCOUNTER — Other Ambulatory Visit: Payer: Self-pay | Admitting: Family Medicine

## 2018-04-14 ENCOUNTER — Telehealth: Payer: Self-pay | Admitting: Family Medicine

## 2018-04-14 MED ORDER — LOSARTAN POTASSIUM-HCTZ 50-12.5 MG PO TABS: 1.0000 | ORAL_TABLET | Freq: Every day | ORAL | 1 refills | Status: DC

## 2018-04-14 NOTE — Telephone Encounter (Signed)
Best number [phone]  Pt called to get a refill  Losartan hctz 50-12.5mg   [facility]  [facility] Member id # [id number]  Phone# [phone]  Pt updated her insurance info over phone

## 2018-04-14 NOTE — Telephone Encounter (Signed)
Refill sent as requested. 

## 2018-05-04 ENCOUNTER — Telehealth: Payer: Self-pay

## 2018-05-04 MED ORDER — LOSARTAN POTASSIUM-HCTZ 50-12.5 MG PO TABS: 1.0000 | ORAL_TABLET | Freq: Every day | ORAL | 1 refills | Status: DC

## 2018-05-04 NOTE — Telephone Encounter (Signed)
[facility] left v/m that due to manufacturers back order they are out of losartan-HCTZ 50-12.5 mg. [facility] request losartan HCTZ 50-12.5 mg  [facility] [address] [facility] [address] I spoke with Colletta [place] at [facility] [address] and they do have med in [place]. [facility] [address]. Pt notified and voiced understanding and will [facility] [address].

## 2018-08-18 ENCOUNTER — Other Ambulatory Visit: Payer: Self-pay | Admitting: Family Medicine

## 2018-08-25 DIAGNOSIS — H524 Presbyopia: Secondary | ICD-10-CM | POA: Diagnosis not present

## 2018-11-03 ENCOUNTER — Other Ambulatory Visit: Payer: Self-pay | Admitting: Family Medicine

## 2018-11-24 ENCOUNTER — Ambulatory Visit (INDEPENDENT_AMBULATORY_CARE_PROVIDER_SITE_OTHER): Payer: BC Managed Care – PPO

## 2018-11-24 DIAGNOSIS — Z23 Encounter for immunization: Secondary | ICD-10-CM | POA: Diagnosis not present

## 2018-12-20 ENCOUNTER — Telehealth: Payer: Self-pay | Admitting: Family Medicine

## 2018-12-20 NOTE — Telephone Encounter (Signed)
She does not need a pneumonia vaccine until she turns 63 years old.

## 2018-12-20 NOTE — Telephone Encounter (Signed)
Pt aware of donna's comments

## 2018-12-20 NOTE — Telephone Encounter (Signed)
Best number [phone] Pt called to schedule pneumonia vaccine  Ok to schedule

## 2019-01-06 ENCOUNTER — Telehealth: Payer: Self-pay

## 2019-01-06 NOTE — Telephone Encounter (Signed)
[place] Rice - Client Nonclinical Telephone Record AccessNurse Client Natalie Rice - Client Client Site [place] Physician Eliezer Lofts - MD Contact Type Call Who Is Calling Patient / Member / Family / Caregiver Caller Name [place] Phone Number [phone] Call Type Message Only Information Provided Reason for Call Returning a Call from the Office Initial [place] states she received notification on my chart . Additional Comment Call Closed By: Natalie Rice Transaction Date/Time: 01/05/2019 5:03:05 PM (ET)

## 2019-02-23 ENCOUNTER — Other Ambulatory Visit: Payer: Self-pay

## 2019-02-23 ENCOUNTER — Ambulatory Visit (INDEPENDENT_AMBULATORY_CARE_PROVIDER_SITE_OTHER): Payer: BC Managed Care – PPO

## 2019-02-23 DIAGNOSIS — Z23 Encounter for immunization: Secondary | ICD-10-CM

## 2019-03-01 ENCOUNTER — Other Ambulatory Visit: Payer: Self-pay | Admitting: Family Medicine

## 2019-03-01 NOTE — Telephone Encounter (Signed)
Patient called requesting a refill on Clotrimazole-betamethasone cream.  [facility] uses [facility].

## 2019-03-01 NOTE — Telephone Encounter (Signed)
Last office visit 03/04/2018 for CPE.  Last refilled 07/31/2014 for 30 g with 1 refill.  No future appointments.

## 2019-03-16 ENCOUNTER — Other Ambulatory Visit: Payer: Self-pay

## 2019-03-16 MED ORDER — CLOTRIMAZOLE-BETAMETHASONE 1-0.05 % EX CREA: 1.0000 "application " | TOPICAL_CREAM | Freq: Two times a day (BID) | CUTANEOUS | 1 refills | Status: DC

## 2019-04-18 ENCOUNTER — Other Ambulatory Visit: Payer: Self-pay

## 2019-04-18 MED ORDER — PRAVASTATIN SODIUM 40 MG PO TABS: 40.0000 mg | ORAL_TABLET | Freq: Every day | ORAL | 0 refills | Status: DC

## 2019-05-02 ENCOUNTER — Other Ambulatory Visit (HOSPITAL_COMMUNITY)
Admission: RE | Admit: 2019-05-02 | Discharge: 2019-05-02 | Disposition: A | Discharge status: Home / Self Care | Payer: BC Managed Care – PPO | Source: Ambulatory Visit | Attending: Family Medicine | Admitting: Family Medicine

## 2019-05-02 ENCOUNTER — Ambulatory Visit (INDEPENDENT_AMBULATORY_CARE_PROVIDER_SITE_OTHER): Payer: BC Managed Care – PPO | Admitting: Family Medicine

## 2019-05-02 ENCOUNTER — Other Ambulatory Visit: Payer: Self-pay

## 2019-05-02 ENCOUNTER — Encounter: Payer: Self-pay | Admitting: Family Medicine

## 2019-05-02 VITALS — BP 130/84 | HR 69 | Temp 98.0°F | Ht 59.0 in | Wt 180.0 lb

## 2019-05-02 DIAGNOSIS — Z124 Encounter for screening for malignant neoplasm of cervix: Secondary | ICD-10-CM

## 2019-05-02 DIAGNOSIS — E669 Obesity, unspecified: Secondary | ICD-10-CM

## 2019-05-02 DIAGNOSIS — M1000 Idiopathic gout, unspecified site: Secondary | ICD-10-CM | POA: Diagnosis not present

## 2019-05-02 DIAGNOSIS — I10 Essential (primary) hypertension: Secondary | ICD-10-CM

## 2019-05-02 DIAGNOSIS — Z0000 Encounter for general adult medical examination without abnormal findings: Secondary | ICD-10-CM | POA: Diagnosis not present

## 2019-05-02 DIAGNOSIS — E7800 Pure hypercholesterolemia, unspecified: Secondary | ICD-10-CM | POA: Diagnosis not present

## 2019-05-02 LAB — COMPREHENSIVE METABOLIC PANEL
ALT: 24 U/L (ref 0–35)
AST: 20 U/L (ref 0–37)
Albumin: 4.4 g/dL (ref 3.5–5.2)
Alkaline Phosphatase: 86 U/L (ref 39–117)
BUN: 13 mg/dL (ref 6–23)
CO2: 31 mEq/L (ref 19–32)
Calcium: 10.2 mg/dL (ref 8.4–10.5)
Chloride: 101 mEq/L (ref 96–112)
Creatinine, Ser: 0.91 mg/dL (ref 0.40–1.20)
GFR: 75.61 mL/min (ref >60.00)
Glucose, Bld: 95 mg/dL (ref 70–99)
Potassium: 3.8 mEq/L (ref 3.5–5.1)
Sodium: 137 mEq/L (ref 135–145)
Total Bilirubin: 0.4 mg/dL (ref 0.2–1.2)
Total Protein: 7.8 g/dL (ref 6.0–8.3)

## 2019-05-02 LAB — LIPID PANEL
Cholesterol: 240 mg/dL — ABNORMAL HIGH (ref 0–200)
HDL: 54.7 mg/dL (ref >39.00)
LDL Cholesterol: 164 mg/dL — ABNORMAL HIGH (ref 0–99)
NonHDL: 185.19
Total CHOL/HDL Ratio: 4
Triglycerides: 108 mg/dL (ref 0.0–149.0)
VLDL: 21.6 mg/dL (ref 0.0–40.0)

## 2019-05-02 LAB — HEMOGLOBIN A1C: Hgb A1c MFr Bld: 6.6 % — ABNORMAL HIGH (ref 4.6–6.5)

## 2019-05-02 LAB — URIC ACID: Uric Acid, Serum: 6.3 mg/dL (ref 2.4–7.0)

## 2019-05-02 NOTE — Progress Notes (Signed)
Chief Complaint  Patient presents with  . Annual Exam    History of Present Illness: HPI  The patient is here for annual wellness exam and preventative care.    Hypertension:   At goal on hyzaar.  BP Readings from Last 3 Encounters:  05/02/19 130/84  03/04/18 138/86  12/03/16 (!) 158/80  Using medication without problems or lightheadedness:  none Chest pain with exertion:none Edema:none Short of breath: none Average home BPs: Other issues:  Elevated Cholesterol: Due for re-eval on pravastatin Using medications without problems: none Muscle aches: none Diet compliance: moderate Exercise: walking 1 hour a day 3 times a week. Other complaints:  Gout  Re-eval today. No flares in last 2 years. Lab Results  Component Value Date   LABURIC 6.9 02/28/2018      This visit occurred during the SARS-CoV-2 public health emergency.  Safety protocols were in place, including screening questions prior to the visit, additional usage of staff PPE, and extensive cleaning of exam room while observing appropriate contact time as indicated for disinfecting solutions.   COVID 19 screen:  No recent travel or known exposure to COVID19 The patient denies respiratory symptoms of COVID 19 at this time. The importance of social distancing was discussed today.     Review of Systems  Constitutional: Negative for chills and fever.  HENT: Negative for congestion and ear pain.   Eyes: Negative for pain and redness.  Respiratory: Negative for cough and shortness of breath.   Cardiovascular: Negative for chest pain, palpitations and leg swelling.  Gastrointestinal: Negative for abdominal pain, blood in stool, constipation, diarrhea, nausea and vomiting.  Genitourinary: Negative for dysuria.  Musculoskeletal: Negative for falls and myalgias.  Skin: Negative for rash.  Neurological: Negative for dizziness.  Psychiatric/Behavioral: Negative for depression. The patient is not nervous/anxious.        Past Medical History:  Diagnosis Date  . Colon polyp   . Eosinophilia 03/03/2012  . Hay fever   . High blood pressure   . High cholesterol     reports that she has quit smoking. Her smoking use included cigarettes. She has a 3.00 pack-year smoking history. She has never used smokeless tobacco. She reports current alcohol use. She reports that she does not use drugs.   Current Outpatient Medications:  .  Cholecalciferol (VITAMIN D3) 5000 UNITS CAPS, Take 1 capsule by mouth daily., Disp: , Rfl:  .  clotrimazole-betamethasone (LOTRISONE) cream, Apply 1 application topically 2 (two) times daily., Disp: 30 g, Rfl: 1 .  Coenzyme Q10 200 MG capsule, Take 200 mg by mouth daily., Disp: , Rfl:  .  Loratadine (CLARITIN) 10 MG CAPS, Take by mouth daily as needed., Disp: , Rfl:  .  losartan-hydrochlorothiazide (HYZAAR) 50-12.5 MG tablet, Take 1 tablet by mouth daily., Disp: 90 tablet, Rfl: 3 .  Omega-3 Fatty Acids (OMEGA 3 PO), Take 1 capsule by mouth daily., Disp: , Rfl:  .  pravastatin (PRAVACHOL) 40 MG tablet, Take 1 tablet (40 mg total) by mouth daily., Disp: 90 tablet, Rfl: 0   Observations/Objective: Blood pressure 130/84, pulse 69, temperature 98 F (36.7 C), temperature source Temporal, height 4\' 11"  (1.499 m), weight 180 lb (81.6 kg), last menstrual period 12/26/2011, SpO2 100 %.  Physical Exam Constitutional:      General: She is not in acute distress.    Appearance: Normal appearance. She is well-developed. She is obese. She is not ill-appearing or toxic-appearing.  HENT:     Head: Normocephalic.  Right Ear: Hearing, tympanic membrane, ear canal and external ear normal.     Left Ear: Hearing, tympanic membrane, ear canal and external ear normal.     Nose: Nose normal.  Eyes:     General: Lids are normal. Lids are everted, no foreign bodies appreciated.     Conjunctiva/sclera: Conjunctivae normal.     Pupils: Pupils are equal, round, and reactive to light.  Neck:     Thyroid:  No thyroid mass or thyromegaly.     Vascular: No carotid bruit.     Trachea: Trachea normal.  Cardiovascular:     Rate and Rhythm: Normal rate and regular rhythm.     Heart sounds: Normal heart sounds, S1 normal and S2 normal. No murmur. No gallop.   Pulmonary:     Effort: Pulmonary effort is normal. No respiratory distress.     Breath sounds: Normal breath sounds. No wheezing, rhonchi or rales.  Abdominal:     General: Bowel sounds are normal. There is no distension or abdominal bruit.     Palpations: Abdomen is soft. There is no fluid wave or mass.     Tenderness: There is no abdominal tenderness. There is no guarding or rebound.     Hernia: No hernia is present.  Genitourinary:    Exam position: Supine.     Labia:        Right: No rash, tenderness or lesion.        Left: No rash, tenderness or lesion.      Vagina: Normal.     Cervix: No cervical motion tenderness, discharge or friability.     Uterus: Not enlarged and not tender.      Adnexa:        Right: No mass, tenderness or fullness.         Left: No mass, tenderness or fullness.    Musculoskeletal:     Cervical back: Normal range of motion and neck supple.  Lymphadenopathy:     Cervical: No cervical adenopathy.  Skin:    General: Skin is warm and dry.     Findings: No rash.  Neurological:     Mental Status: She is alert.     Cranial Nerves: No cranial nerve deficit.     Sensory: No sensory deficit.  Psychiatric:        Mood and Affect: Mood is not anxious or depressed.        Speech: Speech normal.        Behavior: Behavior normal. Behavior is cooperative.        Judgment: Judgment normal.      Assessment and Plan    The patient's preventative maintenance and recommended screening tests for an annual wellness exam were reviewed in full today. Brought up to date unless services declined.  Counselled on the importance of diet, exercise, and its role in overall health and mortality. The patient's FH and SH was  reviewed, including their home life, tobacco status, and drug and alcohol status.   Vaccines:uptodate with td and flu Colon: Nml 2012, plan repeat in 2022 Mammo:8/2016nml, plan repeat in 2 years. PAP/DVE: Nml, neg HPV4/2015, repeat in 2020no history of abnormals.  DUE NOW  Bone density: low risk start age 55. No family history Nonsmoker Hep C: done HIV:  refused  Eliezer Lofts, MD

## 2019-05-02 NOTE — Assessment & Plan Note (Signed)
Well controlled. Continue current medication. Encouraged exercise, weight loss, healthy eating habits.  

## 2019-05-02 NOTE — Assessment & Plan Note (Signed)
Work on weight loss. Methods reviewed.

## 2019-05-02 NOTE — Assessment & Plan Note (Signed)
Due for re-eval on pravastatin

## 2019-05-02 NOTE — Assessment & Plan Note (Signed)
No flares, due for re-eval.

## 2019-05-02 NOTE — Addendum Note (Signed)
Addended by: [name] on: 05/02/2019 12:04 PM   Modules accepted: Orders

## 2019-05-02 NOTE — Patient Instructions (Addendum)
Please stop at the lab to have labs drawn. Work on low fat low carb diet, keep up with your regular exercise and work on weight loss.  Preventive Care 71-63 Years Old, Female Preventive care refers to visits with your health care provider and lifestyle choices that can promote health and wellness. This includes:  A yearly physical exam. This may also be called an annual well check.  Regular dental visits and eye exams.  Immunizations.  Screening for certain conditions.  Healthy lifestyle choices, such as eating a healthy diet, getting regular exercise, not using drugs or products that contain nicotine and tobacco, and limiting alcohol use. What can I expect for my preventive care visit? Physical exam Your health care provider will check your:  Height and weight. This may be used to calculate body mass index (BMI), which tells if you are at a healthy weight.  Heart rate and blood pressure.  Skin for abnormal spots. Counseling Your health care provider may ask you questions about your:  Alcohol, tobacco, and drug use.  Emotional well-being.  Home and relationship well-being.  Sexual activity.  Eating habits.  Work and work Statistician.  Method of birth control.  Menstrual cycle.  Pregnancy history. What immunizations do I need?  Influenza (flu) vaccine  This is recommended every year. Tetanus, diphtheria, and pertussis (Tdap) vaccine  You may need a Td booster every 10 years. Varicella (chickenpox) vaccine  You may need this if you have not been vaccinated. Zoster (shingles) vaccine  You may need this after age 63. Measles, mumps, and rubella (MMR) vaccine  You may need at least one dose of MMR if you were born in 1957 or later. You may also need a second dose. Pneumococcal conjugate (PCV13) vaccine  You may need this if you have certain conditions and were not previously vaccinated. Pneumococcal polysaccharide (PPSV23) vaccine  You may need one or two  doses if you smoke cigarettes or if you have certain conditions. Meningococcal conjugate (MenACWY) vaccine  You may need this if you have certain conditions. Hepatitis A vaccine  You may need this if you have certain conditions or if you travel or work in places where you may be exposed to hepatitis A. Hepatitis B vaccine  You may need this if you have certain conditions or if you travel or work in places where you may be exposed to hepatitis B. Haemophilus influenzae type b (Hib) vaccine  You may need this if you have certain conditions. Human papillomavirus (HPV) vaccine  If recommended by your health care provider, you may need three doses over 6 months. You may receive vaccines as individual doses or as more than one vaccine together in one shot (combination vaccines). Talk with your health care provider about the risks and benefits of combination vaccines. What tests do I need? Blood tests  Lipid and cholesterol levels. These may be checked every 5 years, or more frequently if you are over 36 years old.  Hepatitis C test.  Hepatitis B test. Screening  Lung cancer screening. You may have this screening every year starting at age 63 if you have a 30-pack-year history of smoking and currently smoke or have quit within the past 15 years.  Colorectal cancer screening. All adults should have this screening starting at age 63 and continuing until age 36. Your health care provider may recommend screening at age 28 if you are at increased risk. You will have tests every 1-10 years, depending on your results and the type  of screening test.  Diabetes screening. This is done by checking your blood sugar (glucose) after you have not eaten for a while (fasting). You may have this done every 1-3 years.  Mammogram. This may be done every 1-2 years. Talk with your health care provider about when you should start having regular mammograms. This may depend on whether you have a family history of  breast cancer.  BRCA-related cancer screening. This may be done if you have a family history of breast, ovarian, tubal, or peritoneal cancers.  Pelvic exam and Pap test. This may be done every 3 years starting at age 63. Starting at age 63, this may be done every 5 years if you have a Pap test in combination with an HPV test. Other tests  Sexually transmitted disease (STD) testing.  Bone density scan. This is done to screen for osteoporosis. You may have this scan if you are at high risk for osteoporosis. Follow these instructions at home: Eating and drinking  Eat a diet that includes fresh fruits and vegetables, whole grains, lean protein, and low-fat dairy.  Take vitamin and mineral supplements as recommended by your health care provider.  Do not drink alcohol if: ? Your health care provider tells you not to drink. ? You are pregnant, may be pregnant, or are planning to become pregnant.  If you drink alcohol: ? Limit how much you have to 0-1 drink a day. ? Be aware of how much alcohol is in your drink. In the [place] one drink equals one 12 oz bottle of beer (355 mL), one 5 oz glass of wine (148 mL), or one 1 oz glass of hard liquor (44 mL). Lifestyle  Take daily care of your teeth and gums.  Stay active. Exercise for at least 30 minutes on 5 or more days each week.  Do not use any products that contain nicotine or tobacco, such as cigarettes, e-cigarettes, and chewing tobacco. If you need help quitting, ask your health care provider.  If you are sexually active, practice safe sex. Use a condom or other form of birth control (contraception) in order to prevent pregnancy and STIs (sexually transmitted infections).  If told by your health care provider, take low-dose aspirin daily starting at age 63. What's next?  Visit your health care provider once a year for a well check visit.  Ask your health care provider how often you should have your eyes and teeth checked.  Stay up to  date on all vaccines. This information is not intended to replace advice given to you by your health care provider. Make sure you discuss any questions you have with your health care provider. Document Revised: 11/11/2017 Document Reviewed: 11/11/2017 Elsevier Patient Education  2020 [employer].

## 2019-05-03 ENCOUNTER — Encounter: Payer: Self-pay | Admitting: Family Medicine

## 2019-05-03 DIAGNOSIS — E119 Type 2 diabetes mellitus without complications: Secondary | ICD-10-CM | POA: Insufficient documentation

## 2019-05-03 LAB — CYTOLOGY - PAP
Adequacy: ABSENT
Comment: NEGATIVE
Diagnosis: NEGATIVE
High risk HPV: NEGATIVE

## 2019-05-08 ENCOUNTER — Telehealth: Payer: Self-pay | Admitting: Family Medicine

## 2019-05-08 NOTE — Telephone Encounter (Signed)
Patient called today She stated she received her lab results but would like a call back  Patient said she has some questions about the results. She asked if Dr Diona Browner could call by chance.

## 2019-05-09 NOTE — Telephone Encounter (Signed)
Call ed pt discussed labs in detail and answer dietary questions. She has follow up eval in 3 months.

## 2019-06-07 ENCOUNTER — Telehealth: Payer: Self-pay | Admitting: Family Medicine

## 2019-06-07 NOTE — Telephone Encounter (Signed)
Pt called wanting to get a rx for diet pills.  She stated she discussed her weight with you at last visit  [facility]

## 2019-06-09 NOTE — Telephone Encounter (Signed)
4/6 Pt aware of dr Diona Browner comments

## 2019-06-09 NOTE — Telephone Encounter (Signed)
Have patient set up virtual OV to start weight management regimen. Have her weight daily  in AM and bring to weights to appointment.

## 2019-06-15 ENCOUNTER — Ambulatory Visit: Payer: BC Managed Care – PPO | Admitting: Family Medicine

## 2019-06-20 ENCOUNTER — Ambulatory Visit: Payer: BC Managed Care – PPO | Admitting: Family Medicine

## 2019-07-10 ENCOUNTER — Other Ambulatory Visit: Payer: Self-pay | Admitting: *Deleted

## 2019-07-10 MED ORDER — PRAVASTATIN SODIUM 40 MG PO TABS: 40.0000 mg | ORAL_TABLET | Freq: Every day | ORAL | 3 refills | Status: DC

## 2019-08-01 ENCOUNTER — Ambulatory Visit: Payer: BC Managed Care – PPO | Admitting: Family Medicine

## 2019-08-08 ENCOUNTER — Ambulatory Visit: Payer: BC Managed Care – PPO | Admitting: Family Medicine

## 2019-08-18 ENCOUNTER — Ambulatory Visit: Payer: BC Managed Care – PPO | Admitting: Family Medicine

## 2019-08-25 ENCOUNTER — Ambulatory Visit: Payer: BC Managed Care – PPO | Admitting: Family Medicine

## 2019-08-25 ENCOUNTER — Other Ambulatory Visit: Payer: Self-pay

## 2019-08-25 ENCOUNTER — Encounter: Payer: Self-pay | Admitting: Family Medicine

## 2019-08-25 VITALS — BP 140/82 | HR 75 | Temp 98.0°F | Ht 59.0 in | Wt 174.0 lb

## 2019-08-25 DIAGNOSIS — I10 Essential (primary) hypertension: Secondary | ICD-10-CM | POA: Diagnosis not present

## 2019-08-25 DIAGNOSIS — E119 Type 2 diabetes mellitus without complications: Secondary | ICD-10-CM

## 2019-08-25 DIAGNOSIS — E669 Obesity, unspecified: Secondary | ICD-10-CM | POA: Diagnosis not present

## 2019-08-25 NOTE — Assessment & Plan Note (Signed)
Well controlled at home, element of white coat HTN in office.  Continue current medication.

## 2019-08-25 NOTE — Patient Instructions (Signed)
Great work!  Keep up the exercise and continue low carb heart healthy diet. If weight loss stalls... change up exercise type or duration/speed.

## 2019-08-25 NOTE — Progress Notes (Signed)
Chief Complaint  Patient presents with  . Weight Management    History of Present Illness: HPI  64 year old female with diabetes presents to discuss weight management.  She has been working hard on healthy eating and regular exercise.  Daily exercise for 1 hour. Cardiovascular and weight training She is eating vegan 2 days a week.  Wt Readings from Last 3 Encounters:  08/25/19 174 lb (78.9 kg)  05/02/19 180 lb (81.6 kg)  03/04/18 183 lb (83 kg)   Body mass index is 35.14 kg/m.    Lab Results  Component Value Date   HGBA1C 6.6 (H) 05/02/2019     BP at goal BP at home.. 130/70. Has white coat syndrome.   This visit occurred during the SARS-CoV-2 public health emergency.  Safety protocols were in place, including screening questions prior to the visit, additional usage of staff PPE, and extensive cleaning of exam room while observing appropriate contact time as indicated for disinfecting solutions.   COVID 19 screen:  No recent travel or known exposure to COVID19 The patient denies respiratory symptoms of COVID 19 at this time. The importance of social distancing was discussed today.     Review of Systems  Constitutional: Negative for chills and fever.  HENT: Negative for congestion and ear pain.   Eyes: Negative for pain and redness.  Respiratory: Negative for cough and shortness of breath.   Cardiovascular: Negative for chest pain, palpitations and leg swelling.  Gastrointestinal: Negative for abdominal pain, blood in stool, constipation, diarrhea, nausea and vomiting.  Genitourinary: Negative for dysuria.  Musculoskeletal: Negative for falls and myalgias.  Skin: Negative for rash.  Neurological: Negative for dizziness.  Psychiatric/Behavioral: Negative for depression. The patient is not nervous/anxious.       Past Medical History:  Diagnosis Date  . Colon polyp   . Eosinophilia 03/03/2012  . Hay fever   . High blood pressure   . High cholesterol      reports that she has quit smoking. Her smoking use included cigarettes. She has a 3.00 pack-year smoking history. She has never used smokeless tobacco. She reports current alcohol use. She reports that she does not use drugs.   Current Outpatient Medications:  .  Cholecalciferol (VITAMIN D3) 5000 UNITS CAPS, Take 1 capsule by mouth daily., Disp: , Rfl:  .  clotrimazole-betamethasone (LOTRISONE) cream, Apply 1 application topically 2 (two) times daily., Disp: 30 g, Rfl: 1 .  Coenzyme Q10 200 MG capsule, Take 200 mg by mouth daily., Disp: , Rfl:  .  Loratadine (CLARITIN) 10 MG CAPS, Take by mouth daily as needed., Disp: , Rfl:  .  losartan-hydrochlorothiazide (HYZAAR) 50-12.5 MG tablet, Take 1 tablet by mouth daily., Disp: 90 tablet, Rfl: 3 .  Omega-3 Fatty Acids (OMEGA 3 PO), Take 1 capsule by mouth daily., Disp: , Rfl:  .  pravastatin (PRAVACHOL) 40 MG tablet, Take 1 tablet (40 mg total) by mouth daily., Disp: 90 tablet, Rfl: 3   Observations/Objective: Height 4\' 11"  (1.499 m), last menstrual period 12/26/2011. BP Readings from Last 3 Encounters:  08/25/19 140/82  05/02/19 130/84  03/04/18 138/86    Physical Exam Constitutional:      General: She is not in acute distress.    Appearance: Normal appearance. She is well-developed. She is obese. She is not ill-appearing or toxic-appearing.  HENT:     Head: Normocephalic.     Right Ear: Hearing, tympanic membrane, ear canal and external ear normal. Tympanic membrane is not erythematous, retracted  or bulging.     Left Ear: Hearing, tympanic membrane, ear canal and external ear normal. Tympanic membrane is not erythematous, retracted or bulging.     Nose: No mucosal edema or rhinorrhea.     Right Sinus: No maxillary sinus tenderness or frontal sinus tenderness.     Left Sinus: No maxillary sinus tenderness or frontal sinus tenderness.     Mouth/Throat:     Pharynx: Uvula midline.  Eyes:     General: Lids are normal. Lids are everted, no  foreign bodies appreciated.     Conjunctiva/sclera: Conjunctivae normal.     Pupils: Pupils are equal, round, and reactive to light.  Neck:     Thyroid: No thyroid mass or thyromegaly.     Vascular: No carotid bruit.     Trachea: Trachea normal.  Cardiovascular:     Rate and Rhythm: Normal rate and regular rhythm.     Pulses: Normal pulses.     Heart sounds: Normal heart sounds, S1 normal and S2 normal. No murmur heard.  No friction rub. No gallop.   Pulmonary:     Effort: Pulmonary effort is normal. No tachypnea or respiratory distress.     Breath sounds: Normal breath sounds. No decreased breath sounds, wheezing, rhonchi or rales.  Abdominal:     General: Bowel sounds are normal.     Palpations: Abdomen is soft.     Tenderness: There is no abdominal tenderness.  Musculoskeletal:     Cervical back: Normal range of motion and neck supple.  Skin:    General: Skin is warm and dry.     Findings: No rash.  Neurological:     Mental Status: She is alert.  Psychiatric:        Mood and Affect: Mood is not anxious or depressed.        Speech: Speech normal.        Behavior: Behavior normal. Behavior is cooperative.        Thought Content: Thought content normal.        Judgment: Judgment normal.      Assessment and Plan   Obesity, Class II, BMI 35-39.9, no comorbidity (HCC)  Improving with regular cardiovascular exercise and healthy diet.  She is not interested in GLP1 as she has fear of injections.  Diabetes mellitus without complication ([place]) Re-eval in 3 months.  Essential hypertension, benign Well controlled at home, element of white coat HTN in office.  Continue current medication.       Eliezer Lofts, MD

## 2019-08-25 NOTE — Assessment & Plan Note (Signed)
Re-eval in 3 months.

## 2019-08-25 NOTE — Assessment & Plan Note (Signed)
Improving with regular cardiovascular exercise and healthy diet.  She is not interested in GLP1 as she has fear of injections.

## 2019-10-12 ENCOUNTER — Other Ambulatory Visit: Payer: Self-pay | Admitting: Family Medicine

## 2019-10-17 DIAGNOSIS — Z1231 Encounter for screening mammogram for malignant neoplasm of breast: Secondary | ICD-10-CM | POA: Diagnosis not present

## 2019-10-17 LAB — HM MAMMOGRAPHY

## 2019-10-18 ENCOUNTER — Other Ambulatory Visit: Payer: Self-pay | Admitting: *Deleted

## 2019-10-18 MED ORDER — PRAVASTATIN SODIUM 40 MG PO TABS: 40.0000 mg | ORAL_TABLET | Freq: Every day | ORAL | 1 refills | Status: DC

## 2019-10-20 ENCOUNTER — Encounter: Payer: Self-pay | Admitting: Family Medicine

## 2019-11-17 ENCOUNTER — Telehealth: Payer: Self-pay | Admitting: Family Medicine

## 2019-11-17 ENCOUNTER — Other Ambulatory Visit: Payer: BC Managed Care – PPO

## 2019-11-17 DIAGNOSIS — M109 Gout, unspecified: Secondary | ICD-10-CM

## 2019-11-17 DIAGNOSIS — E119 Type 2 diabetes mellitus without complications: Secondary | ICD-10-CM

## 2019-11-17 NOTE — Telephone Encounter (Signed)
-----   Message from Cloyd Stagers, RT sent at 10/30/2019 10:35 AM EDT ----- Regarding: Lab Orders for Friday 9.3.2021 Please place lab orders for Friday 9.3.2021, office visit for physical on Friday 9.10.2021 Thank you, Dyke Maes RT(R)

## 2019-11-24 ENCOUNTER — Ambulatory Visit: Payer: BC Managed Care – PPO | Admitting: Family Medicine

## 2019-12-23 ENCOUNTER — Ambulatory Visit (INDEPENDENT_AMBULATORY_CARE_PROVIDER_SITE_OTHER): Payer: BC Managed Care – PPO

## 2019-12-23 DIAGNOSIS — Z23 Encounter for immunization: Secondary | ICD-10-CM | POA: Diagnosis not present

## 2020-04-15 ENCOUNTER — Other Ambulatory Visit: Payer: Self-pay | Admitting: Family Medicine

## 2020-04-15 NOTE — Telephone Encounter (Signed)
Please schedule CPE with fasting labs prior for Dr. Bedsole. 

## 2020-04-15 NOTE — Telephone Encounter (Signed)
Spoke with patient said will call back to schedule appt. 

## 2020-05-06 ENCOUNTER — Ambulatory Visit
Admission: EM | Admit: 2020-05-06 | Discharge: 2020-05-06 | Disposition: A | Discharge status: Home / Self Care | Payer: BC Managed Care – PPO | Attending: Physician Assistant | Admitting: Physician Assistant

## 2020-05-06 ENCOUNTER — Other Ambulatory Visit: Payer: Self-pay

## 2020-05-06 ENCOUNTER — Telehealth: Payer: Self-pay | Admitting: *Deleted

## 2020-05-06 ENCOUNTER — Ambulatory Visit (INDEPENDENT_AMBULATORY_CARE_PROVIDER_SITE_OTHER): Payer: BC Managed Care – PPO

## 2020-05-06 DIAGNOSIS — R059 Cough, unspecified: Secondary | ICD-10-CM

## 2020-05-06 DIAGNOSIS — R062 Wheezing: Secondary | ICD-10-CM

## 2020-05-06 DIAGNOSIS — R058 Other specified cough: Secondary | ICD-10-CM

## 2020-05-06 MED ORDER — ALBUTEROL SULFATE HFA 108 (90 BASE) MCG/ACT IN AERS: 2.0000 | INHALATION_SPRAY | Freq: Four times a day (QID) | RESPIRATORY_TRACT | 0 refills | Status: DC | PRN

## 2020-05-06 NOTE — Telephone Encounter (Signed)
Noted  

## 2020-05-06 NOTE — ED Provider Notes (Signed)
[facility]    CSN: [id number] Arrival date & time: 05/06/20  1017      History   Chief Complaint Chief Complaint  Patient presents with  . Wheezing    HPI Natalie Rice is a 65 y.o. female.   Presents with a 2 week  History of night time wheeze and slight cough. She reports that she hears wheezing at night. No dyspnea. No day cough. No fever or chills. No URI symptoms are noted. She otherwise feels well. No prior history of lung disease. Remote smoker.      Past Medical History:  Diagnosis Date  . Colon polyp   . Eosinophilia 03/03/2012  . Hay fever   . High blood pressure   . High cholesterol     Patient Active Problem List   Diagnosis Date Noted  . Diabetes mellitus without complication ([place]) 76/22/6333  . Obesity, Class II, BMI 35-39.9, no comorbidity ([place]) 11/22/2015  . Family history of early CAD 06/07/2013  . Gout 09/19/2012  . Left femoral vein DVT, hx of 09/15/2012  . Essential hypertension, benign   . High cholesterol     Past Surgical History:  Procedure Laterality Date  . TUBAL LIGATION      OB History   No obstetric history on file.      Home Medications    Prior to Admission medications   Medication Sig Start Date End Date Taking? Authorizing Provider  albuterol (VENTOLIN HFA) 108 (90 Base) MCG/ACT inhaler Inhale 2 puffs into the lungs every 6 (six) hours as needed for wheezing or shortness of breath. 05/06/20  Yes Wassim Kirksey, Vanessa [place], PA-C  APPLE CIDER VINEGAR PO Take 2 each by mouth daily. Perrin    [provider]  Cholecalciferol (VITAMIN D3) 5000 UNITS CAPS Take 1 capsule by mouth daily.    [provider]  clotrimazole-betamethasone (LOTRISONE) cream Apply 1 application topically 2 (two) times daily. 03/16/19   Bedsole, Amy E, MD  Coenzyme Q10 200 MG capsule Take 200 mg by mouth daily.    [provider]  Loratadine (CLARITIN) 10 MG CAPS Take by mouth daily as needed.    [provider]   losartan-hydrochlorothiazide (HYZAAR) 50-12.5 MG tablet TAKE 1 TABLET BY MOUTH DAILY 04/15/20   Bedsole, Amy E, MD  Omega-3 Fatty Acids (OMEGA 3 PO) Take 1 capsule by mouth daily.    [provider]  pravastatin (PRAVACHOL) 40 MG tablet Take 1 tablet (40 mg total) by mouth daily. 10/18/19   Jinny Sanders, MD    Family History Family History  Problem Relation Age of Onset  . Cancer Mother 1       cervical cancer  . Heart disease Father 31       MI  . Heart disease Brother 64       MI  . Diabetes Maternal Grandmother   . Diabetes Maternal Grandfather     Social History Social History   Tobacco Use  . Smoking status: Former Smoker    Packs/day: 1.00    Years: 3.00    Pack years: 3.00    Types: Cigarettes  . Smokeless tobacco: Never Used  . Tobacco comment: quit 2007  Substance Use Topics  . Alcohol use: Yes    Alcohol/week: 0.0 standard drinks    Comment: occassionally  . Drug use: No     Allergies   Patient has no known allergies.   Review of Systems Review of Systems  Constitutional: Negative for fever.  HENT: Negative.   Respiratory: Positive for cough and wheezing. Negative for shortness of breath.   Cardiovascular: Negative for chest pain and leg swelling.  Skin: Negative for rash.  Allergic/Immunologic: Negative.      Physical Exam Triage Vital Signs ED Triage Vitals [05/06/20 1037]  Enc Vitals Group     BP (!) 151/92     Pulse Rate 86     Resp 18     Temp 98.2 F (36.8 C)     Temp Source Oral     SpO2 98 %     Weight      Height      Head Circumference      Peak Flow      Pain Score 0     Pain Loc      Pain Edu?      Excl. in [place]?    No data found.  Updated Vital Signs BP (!) 151/92 (BP Location: Left Arm)   Pulse 86   Temp 98.2 F (36.8 C) (Oral)   Resp 18   LMP 12/26/2011   SpO2 98%   Visual Acuity Right Eye Distance:   Left Eye Distance:   Bilateral Distance:    Right Eye Near:   Left Eye Near:    Bilateral  Near:     Physical Exam Vitals and nursing note reviewed.  Constitutional:      General: She is not in acute distress.    Appearance: Normal appearance. She is not ill-appearing or toxic-appearing.  HENT:     Head: Normocephalic and atraumatic.  Cardiovascular:     Rate and Rhythm: Normal rate and regular rhythm.  Pulmonary:     Effort: Pulmonary effort is normal. No respiratory distress.     Breath sounds: No stridor. Wheezing present. No rhonchi or rales.     Comments: Mild expiratory wheeze in all bases Chest:     Chest wall: No tenderness.  Musculoskeletal:        General: Normal range of motion.  Skin:    General: Skin is warm.     Findings: No rash.  Neurological:     General: No focal deficit present.     Mental Status: She is alert.  Psychiatric:        Mood and Affect: Mood normal.      UC Treatments / Results  Labs (all labs ordered are listed, but only abnormal results are displayed) Labs Reviewed - No data to display  EKG   Radiology DG Chest 2 View  Result Date: 05/06/2020 CLINICAL DATA:  Cough and wheezing for 2 weeks. EXAM: CHEST - 2 VIEW COMPARISON:  None. FINDINGS: The heart size and mediastinal contours are within normal limits. Both lungs are clear. The visualized skeletal structures are unremarkable. IMPRESSION: No active cardiopulmonary disease. Electronically Signed   By: Marlaine Hind M.D.   On: 05/06/2020 11:47    Procedures Procedures (including critical care time)  Medications Ordered in UC Medications - No data to display  Initial Impression / Assessment and Plan / UC Course  I have reviewed the triage vital signs and the nursing notes.  Pertinent labs & imaging results that were available during my care of the patient were reviewed by me and considered in my medical decision making (see chart for details).   Normal CXR- no indication of infection is noted. Treat symptomatically with an inhaler x 3 days and if continues would suggest a  f/u with her PCP for further work up.  Final Clinical Impressions(s) / UC Diagnoses   Final diagnoses:  Nocturnal cough with wheeze     Discharge Instructions     Night time wheeze is generally a reactive wheeze or cough from allergies, virus. Will treat symptoms and if improves great. If not f/u with your PCP   ED Prescriptions    Medication Sig Dispense Auth. Provider   albuterol (VENTOLIN HFA) 108 (90 Base) MCG/ACT inhaler Inhale 2 puffs into the lungs every 6 (six) hours as needed for wheezing or shortness of breath. 1 each Bjorn Pippin, PA-C     PDMP not reviewed this encounter.   Bjorn Pippin, [place] 05/06/20 1232

## 2020-05-06 NOTE — Telephone Encounter (Signed)
Patient called stating that she has had some gurgling in her chest when she lays down at night. Patient stated that she makes herself cough and is able to get some phlegm up which is yellow/white and a little pink in color. Patient stated that she is not able to sleep at night because of the noise. Patient stated that the noise is actually wheezing. Patient denies fever or difficulty breathing. Patient stated that her symptoms started about two weeks ago. Advised patient that we do not have any appointments available today at the office. Patient was advised with the wheezing and congestion in her chest she should have a face to face evaluation. Patient was given information on the Urgent Care at [facility] in case she needs a chest x-ray.  Patient stated that she is going to head over to the UC in the next 20 minutes.

## 2020-05-06 NOTE — ED Triage Notes (Signed)
Pt c/o wheezing at night for the past 2wks with chest congestion. Denies SOB or wheezing during the day.

## 2020-05-06 NOTE — Discharge Instructions (Addendum)
Night time wheeze is generally a reactive wheeze or cough from allergies, virus. Will treat symptoms and if improves great. If not f/u with your PCP

## 2020-05-20 LAB — HM DIABETES FOOT EXAM

## 2020-05-21 ENCOUNTER — Other Ambulatory Visit: Payer: Self-pay | Admitting: Family Medicine

## 2020-05-21 NOTE — Telephone Encounter (Signed)
Spoke with patient scheduled CPE with fasting labs 

## 2020-05-21 NOTE — Telephone Encounter (Signed)
Please schedule CPE with fasting labs prior with Dr. Bedsole.  

## 2020-05-24 ENCOUNTER — Other Ambulatory Visit: Payer: Self-pay

## 2020-05-24 ENCOUNTER — Other Ambulatory Visit (INDEPENDENT_AMBULATORY_CARE_PROVIDER_SITE_OTHER): Payer: BC Managed Care – PPO

## 2020-05-24 ENCOUNTER — Telehealth: Payer: Self-pay | Admitting: Family Medicine

## 2020-05-24 DIAGNOSIS — E119 Type 2 diabetes mellitus without complications: Secondary | ICD-10-CM

## 2020-05-24 DIAGNOSIS — M1000 Idiopathic gout, unspecified site: Secondary | ICD-10-CM | POA: Diagnosis not present

## 2020-05-24 LAB — COMPREHENSIVE METABOLIC PANEL
ALT: 20 U/L (ref 0–35)
AST: 17 U/L (ref 0–37)
Albumin: 4.3 g/dL (ref 3.5–5.2)
Alkaline Phosphatase: 76 U/L (ref 39–117)
BUN: 12 mg/dL (ref 6–23)
CO2: 29 mEq/L (ref 19–32)
Calcium: 10.2 mg/dL (ref 8.4–10.5)
Chloride: 101 mEq/L (ref 96–112)
Creatinine, Ser: 0.99 mg/dL (ref 0.40–1.20)
GFR: 60.55 mL/min (ref >60.00)
Glucose, Bld: 110 mg/dL — ABNORMAL HIGH (ref 70–99)
Potassium: 4 mEq/L (ref 3.5–5.1)
Sodium: 138 mEq/L (ref 135–145)
Total Bilirubin: 0.5 mg/dL (ref 0.2–1.2)
Total Protein: 7.4 g/dL (ref 6.0–8.3)

## 2020-05-24 LAB — LIPID PANEL
Cholesterol: 225 mg/dL — ABNORMAL HIGH (ref 0–200)
HDL: 52.3 mg/dL (ref >39.00)
LDL Cholesterol: 147 mg/dL — ABNORMAL HIGH (ref 0–99)
NonHDL: 172.45
Total CHOL/HDL Ratio: 4
Triglycerides: 129 mg/dL (ref 0.0–149.0)
VLDL: 25.8 mg/dL (ref 0.0–40.0)

## 2020-05-24 LAB — HEMOGLOBIN A1C: Hgb A1c MFr Bld: 6.1 % (ref 4.6–6.5)

## 2020-05-24 LAB — URIC ACID: Uric Acid, Serum: 7 mg/dL (ref 2.4–7.0)

## 2020-05-24 NOTE — Progress Notes (Signed)
No critical labs need to be addressed urgently. We will discuss labs in detail at upcoming office visit.   

## 2020-05-24 NOTE — Telephone Encounter (Signed)
-----   Message from Ellamae Sia sent at 05/24/2020  8:28 AM EST ----- Regarding: Lab orders for now Patient is scheduled for CPX labs, please order future labs, Thanks , Karna Christmas

## 2020-05-29 ENCOUNTER — Encounter: Payer: Self-pay | Admitting: Family Medicine

## 2020-05-29 ENCOUNTER — Ambulatory Visit (INDEPENDENT_AMBULATORY_CARE_PROVIDER_SITE_OTHER): Payer: BC Managed Care – PPO | Admitting: Family Medicine

## 2020-05-29 ENCOUNTER — Other Ambulatory Visit: Payer: Self-pay

## 2020-05-29 VITALS — BP 130/76 | HR 83 | Temp 97.6°F | Ht 59.0 in | Wt 176.2 lb

## 2020-05-29 DIAGNOSIS — E669 Obesity, unspecified: Secondary | ICD-10-CM | POA: Diagnosis not present

## 2020-05-29 DIAGNOSIS — Z0000 Encounter for general adult medical examination without abnormal findings: Secondary | ICD-10-CM | POA: Diagnosis not present

## 2020-05-29 DIAGNOSIS — Z23 Encounter for immunization: Secondary | ICD-10-CM

## 2020-05-29 DIAGNOSIS — Q309 Congenital malformation of nose, unspecified: Secondary | ICD-10-CM

## 2020-05-29 DIAGNOSIS — I152 Hypertension secondary to endocrine disorders: Secondary | ICD-10-CM

## 2020-05-29 DIAGNOSIS — M1000 Idiopathic gout, unspecified site: Secondary | ICD-10-CM

## 2020-05-29 DIAGNOSIS — E1159 Type 2 diabetes mellitus with other circulatory complications: Secondary | ICD-10-CM | POA: Diagnosis not present

## 2020-05-29 DIAGNOSIS — E1169 Type 2 diabetes mellitus with other specified complication: Secondary | ICD-10-CM

## 2020-05-29 DIAGNOSIS — E785 Hyperlipidemia, unspecified: Secondary | ICD-10-CM

## 2020-05-29 DIAGNOSIS — E119 Type 2 diabetes mellitus without complications: Secondary | ICD-10-CM

## 2020-05-29 MED ORDER — ATORVASTATIN CALCIUM 10 MG PO TABS: 10.0000 mg | ORAL_TABLET | Freq: Every day | ORAL | 11 refills | Status: DC

## 2020-05-29 NOTE — Assessment & Plan Note (Signed)
Try trial of flonase for possible swelling causing whistling at night... may need referral to ENT.   CXR in 04/2020 and lung exam clear today.  No smoker.. no improvement with albuterol.

## 2020-05-29 NOTE — Assessment & Plan Note (Signed)
Cut back on shellfish and shrimp to avoid flares given uric acid increasing.

## 2020-05-29 NOTE — Assessment & Plan Note (Signed)
Diet controlled. Encouraged exercise, weight loss, healthy eating habits.  

## 2020-05-29 NOTE — Assessment & Plan Note (Signed)
Inadequate control on pravastatin.. change to lipitor 10 mg daily and recheck in 3 months.

## 2020-05-29 NOTE — Addendum Note (Signed)
Addended by: [name] on: 05/29/2020 10:25 AM   Modules accepted: Orders

## 2020-05-29 NOTE — Progress Notes (Signed)
Patient ID: Natalie Rice, female    DOB: 03/29/1955, 65 y.o.   MRN: [id number]  This visit was conducted in person.  BP 130/76   Pulse 83   Temp 97.6 F (36.4 C) (Temporal)   Ht 4\' 11"  (1.499 m)   Wt 176 lb 4 oz (79.9 kg)   LMP 12/26/2011   SpO2 99%   BMI 35.60 kg/m    CC:  Chief Complaint  Patient presents with  . Annual Exam    Subjective:   HPI: Natalie Rice is a 65 y.o. female presenting on 05/29/2020 for Annual Exam    She feels well overall.  At night she has noted noise in chest, possible due to nose x 1 month... hears whistle, gurgling. Only when lies down at night. No exactly snoring... hears it when awake.  Went to urgent care.. CXR clear, normal lung exam.  tried albuterol as needed before bed... does not seem to help. Seems to be some better.  No apenic spells noted.   She denies congestion, cough, no SOB. No heartburn I is interfering with her sleep.  On Claritin.  Diabetes:  Diet controlled .. much improved from last year. Lab Results  Component Value Date   HGBA1C 6.1 05/24/2020  Using medications without difficulties: Hypoglycemic episodes: Hyperglycemic episodes: Feet problems: no ulcers Blood Sugars averaging: eye exam within last year: due  Hypertension:    At goal on  losartan-hydrochlorothiazide (HYZAAR) 50-12.5 MG tablet TAKE 1 TABLET BY MOUTH DAILY   BP Readings from Last 3 Encounters:  05/29/20 130/76  05/06/20 (!) 151/92  08/25/19 140/82  Using medication without problems or lightheadedness:  None Chest pain with exertion:none Edema:none none Short of breath: Average home BPs: Other issues:  Elevated Cholesterol:LDL not at goal  < 100 despite pravastatin 40 mg daily but improving Lab Results  Component Value Date   CHOL 225 (H) 05/24/2020   HDL 52.30 05/24/2020   LDLCALC 147 (H) 05/24/2020   TRIG 129.0 05/24/2020   CHOLHDL 4 05/24/2020  Using medications without problems: none Muscle aches:  none Diet  compliance: moderate, only seafood, no meat Exercise:every other day Other complaints: Wt Readings from Last 3 Encounters:  05/29/20 176 lb 4 oz (79.9 kg)  08/25/19 174 lb (78.9 kg)  05/02/19 180 lb (81.6 kg)  The 10-year ASCVD risk score Mikey Bussing DC Jr., et al., 2013) is: 21.5%   Values used to calculate the score:     Age: 84 years     Sex: Female     Is Non-Hispanic African American: Yes     Diabetic: Yes     Tobacco smoker: No     Systolic Blood Pressure: 381 mmHg     Is BP treated: Yes     HDL Cholesterol: 52.3 mg/dL     Total Cholesterol: 225 mg/dL    Gout: Uric acid 7.. no recent flares but eating more seafood       Relevant past medical, surgical, family and social history reviewed and updated as indicated. Interim medical history since our last visit reviewed. Allergies and medications reviewed and updated. Outpatient Medications Prior to Visit  Medication Sig Dispense Refill  . albuterol (VENTOLIN HFA) 108 (90 Base) MCG/ACT inhaler Inhale 2 puffs into the lungs every 6 (six) hours as needed for wheezing or shortness of breath. 1 each 0  . APPLE CIDER VINEGAR PO Take 2 each by mouth daily. GOLI    . Cholecalciferol (VITAMIN D3) 5000 UNITS CAPS  Take 1 capsule by mouth daily.    . clotrimazole-betamethasone (LOTRISONE) cream Apply 1 application topically 2 (two) times daily. 30 g 1  . Coenzyme Q10 200 MG capsule Take 200 mg by mouth daily.    . Loratadine 10 MG CAPS Take by mouth daily as needed.    Marland Kitchen losartan-hydrochlorothiazide (HYZAAR) 50-12.5 MG tablet TAKE 1 TABLET BY MOUTH DAILY 90 tablet 0  . Omega-3 Fatty Acids (OMEGA 3 PO) Take 1 capsule by mouth daily.    . pravastatin (PRAVACHOL) 40 MG tablet TAKE 1 TABLET DAILY 90 tablet 0   No facility-administered medications prior to visit.     Per HPI unless specifically indicated in ROS section below Review of Systems Objective:  BP 130/76   Pulse 83   Temp 97.6 F (36.4 C) (Temporal)   Ht 4\' 11"  (1.499 m)   Wt  176 lb 4 oz (79.9 kg)   LMP 12/26/2011   SpO2 99%   BMI 35.60 kg/m   Wt Readings from Last 3 Encounters:  05/29/20 176 lb 4 oz (79.9 kg)  08/25/19 174 lb (78.9 kg)  05/02/19 180 lb (81.6 kg)      Physical Exam   Diabetic foot exam: Normal inspection No skin breakdown No calluses  Normal DP pulses Normal sensation to light touch and monofilament Nails normal     Results for orders placed or performed in visit on 05/24/20  Uric acid  Result Value Ref Range   Uric Acid, Serum 7.0 2.4 - 7.0 mg/dL  Comprehensive metabolic panel  Result Value Ref Range   Sodium 138 135 - 145 mEq/L   Potassium 4.0 3.5 - 5.1 mEq/L   Chloride 101 96 - 112 mEq/L   CO2 29 19 - 32 mEq/L   Glucose, Bld 110 (H) 70 - 99 mg/dL   BUN 12 6 - 23 mg/dL   Creatinine, Ser 0.99 0.40 - 1.20 mg/dL   Total Bilirubin 0.5 0.2 - 1.2 mg/dL   Alkaline Phosphatase 76 39 - 117 U/L   AST 17 0 - 37 U/L   ALT 20 0 - 35 U/L   Total Protein 7.4 6.0 - 8.3 g/dL   Albumin 4.3 3.5 - 5.2 g/dL   GFR 60.55 >60.00 mL/min   Calcium 10.2 8.4 - 10.5 mg/dL  Lipid panel  Result Value Ref Range   Cholesterol 225 (H) 0 - 200 mg/dL   Triglycerides 129.0 0.0 - 149.0 mg/dL   HDL 52.30 >39.00 mg/dL   VLDL 25.8 0.0 - 40.0 mg/dL   LDL Cholesterol 147 (H) 0 - 99 mg/dL   Total CHOL/HDL Ratio 4    NonHDL 172.45   Hemoglobin A1c  Result Value Ref Range   Hgb A1c MFr Bld 6.1 4.6 - 6.5 %    This visit occurred during the SARS-CoV-2 public health emergency.  Safety protocols were in place, including screening questions prior to the visit, additional usage of staff PPE, and extensive cleaning of exam room while observing appropriate contact time as indicated for disinfecting solutions.   COVID 19 screen:  No recent travel or known exposure to COVID19 The patient denies respiratory symptoms of COVID 19 at this time. The importance of social distancing was discussed today.   Assessment and Plan   The patient's preventative maintenance  and recommended screening tests for an annual wellness exam were reviewed in full today. Brought up to date unless services declined.  Counselled on the importance of diet, exercise, and its role in overall health  and mortality. The patient's FH and SH was reviewed, including their home life, tobacco status, and drug and alcohol status.   Vaccines:uptodatewith td, COVID x 3 and flu... given PNA 23 today. Colon: Nml 2012, plan repeat in 2022 Mammo:8/2021nml. PAP/DVE: Nml, neg HPV4/2015, repeat in 2020no history of abnormals. 04/2019 normal Bone density: low risk start age 75. No family history Nonsmoker Hep C: done MGN:OIBBCWU  Problem List Items Addressed This Visit    Diabetes mellitus without complication ([place])     Diet controlled. Encouraged exercise, weight loss, healthy eating habits..       Relevant Medications   atorvastatin (LIPITOR) 10 MG tablet   Other Relevant Orders   Hemoglobin A1c   Gout    Cut back on shellfish and shrimp to avoid flares given uric acid increasing.      Hyperlipidemia associated with type 2 diabetes mellitus (HCC) (Chronic)    Inadequate control on pravastatin.. change to lipitor 10 mg daily and recheck in 3 months.      Relevant Medications   atorvastatin (LIPITOR) 10 MG tablet   Other Relevant Orders   Comprehensive metabolic panel   Lipid panel   Hypertension associated with diabetes ([place]) - Primary (Chronic)    Stable, chronic.  Continue current medication.   Losartan HCTZ 50/12.5 mg daily.      Relevant Medications   atorvastatin (LIPITOR) 10 MG tablet   Obesity, Class II, BMI 35-39.9, no comorbidity (HCC) (Chronic)       Eliezer Lofts, MD

## 2020-05-29 NOTE — Patient Instructions (Addendum)
Set up yearly eye exam for diabetes and have the opthalmologist send [place] a copy of the evaluation for the chart. Stop pravastatin.. change to atorvastatin 10 mg daily.. call if not tolerating.  Return in 3 month for cholesterol recheck. Start flonase 2 sprays per nostril daily x 2 week.. if sound from airway not improving after 2 weeks of flonase call for possible prednisone taper vs ENT referral.

## 2020-05-29 NOTE — Assessment & Plan Note (Signed)
Stable, chronic.  Continue current medication. ? ? ?Losartan HCTZ 50/12.5 mg daily. ?

## 2020-05-31 NOTE — Telephone Encounter (Signed)
Pt left v/m that she had sent pt message wanting to know if she can take claritin while using flonase; to my knowledge it is OK to take claritin while using flonase but will send to Dr Diona Browner and Butch Penny CMA for confirmation.

## 2020-06-10 ENCOUNTER — Telehealth: Payer: Self-pay

## 2020-06-10 NOTE — Telephone Encounter (Signed)
Patient called in and stated that she was seen by Dr. Diona Browner at an office visit on 3/16 and was prescribed Flonase. Patient stated that the Flonase has not helped and she is still hearing the sound when she lays down. Patient is wanting to know if there is something else that Dr. Diona Browner recommends. Please advise.

## 2020-06-11 ENCOUNTER — Other Ambulatory Visit: Payer: Self-pay | Admitting: Family Medicine

## 2020-06-11 MED ORDER — PREDNISONE 10 MG PO TABS: ORAL_TABLET | ORAL | 0 refills | Status: DC

## 2020-06-11 NOTE — Telephone Encounter (Signed)
Prescription sent in  

## 2020-06-11 NOTE — Telephone Encounter (Signed)
We can try a prednisone taper.Marland Kitchen let me know what pharmacy she prefers.

## 2020-06-11 NOTE — Telephone Encounter (Signed)
Patient notified as instructed by telephone.  She would like a prednisone prescription sent to [facility] in [place].

## 2020-06-21 ENCOUNTER — Other Ambulatory Visit: Payer: Self-pay | Admitting: *Deleted

## 2020-06-21 MED ORDER — ATORVASTATIN CALCIUM 10 MG PO TABS: 10.0000 mg | ORAL_TABLET | Freq: Every day | ORAL | 3 refills | Status: DC

## 2020-08-01 ENCOUNTER — Other Ambulatory Visit: Payer: Self-pay | Admitting: Family Medicine

## 2020-08-29 ENCOUNTER — Other Ambulatory Visit: Payer: BC Managed Care – PPO

## 2020-11-28 ENCOUNTER — Other Ambulatory Visit: Payer: BC Managed Care – PPO

## 2020-12-03 ENCOUNTER — Ambulatory Visit: Payer: BC Managed Care – PPO | Admitting: Family Medicine

## 2020-12-09 ENCOUNTER — Telehealth: Payer: Self-pay | Admitting: Family Medicine

## 2020-12-09 ENCOUNTER — Other Ambulatory Visit: Payer: Self-pay | Admitting: Family Medicine

## 2020-12-09 DIAGNOSIS — Q309 Congenital malformation of nose, unspecified: Secondary | ICD-10-CM

## 2020-12-09 NOTE — Telephone Encounter (Signed)
FYI.. front desk needs to be reminded to gather location and reason for referrals when a patient calls.  Thought we had made a template for this?  See attached note for example.  Butch Penny: can you call and find out location for referral? I believe the reason if the nose issue, whistling sound she hears when breathing.

## 2020-12-09 NOTE — Progress Notes (Signed)
Referral placed.

## 2020-12-09 NOTE — Telephone Encounter (Signed)
Pt called she would like to be referred to ENT she is still having issues

## 2020-12-09 NOTE — Telephone Encounter (Signed)
Spoke with [employer].  She states she is still having noises that she feels is coming from her throat or sinuses.  She does not have preference of location.  She travels for work and will be off the next 2 weeks if possible to get appointment in the next 2 weeks.

## 2021-01-14 DIAGNOSIS — R0683 Snoring: Secondary | ICD-10-CM | POA: Diagnosis not present

## 2021-01-14 DIAGNOSIS — K219 Gastro-esophageal reflux disease without esophagitis: Secondary | ICD-10-CM | POA: Diagnosis not present

## 2021-01-14 DIAGNOSIS — J301 Allergic rhinitis due to pollen: Secondary | ICD-10-CM | POA: Diagnosis not present

## 2021-01-23 DIAGNOSIS — Z1231 Encounter for screening mammogram for malignant neoplasm of breast: Secondary | ICD-10-CM | POA: Diagnosis not present

## 2021-01-28 ENCOUNTER — Ambulatory Visit: Payer: BC Managed Care – PPO | Admitting: Family Medicine

## 2021-02-12 DIAGNOSIS — R921 Mammographic calcification found on diagnostic imaging of breast: Secondary | ICD-10-CM | POA: Diagnosis not present

## 2021-02-12 DIAGNOSIS — R922 Inconclusive mammogram: Secondary | ICD-10-CM | POA: Diagnosis not present

## 2021-02-12 DIAGNOSIS — R928 Other abnormal and inconclusive findings on diagnostic imaging of breast: Secondary | ICD-10-CM | POA: Diagnosis not present

## 2021-02-12 LAB — HM MAMMOGRAPHY

## 2021-02-13 ENCOUNTER — Encounter: Payer: Self-pay | Admitting: Family Medicine

## 2021-02-26 ENCOUNTER — Encounter: Payer: Self-pay | Admitting: Family Medicine

## 2021-02-26 ENCOUNTER — Other Ambulatory Visit: Payer: Self-pay

## 2021-02-26 DIAGNOSIS — D0511 Intraductal carcinoma in situ of right breast: Secondary | ICD-10-CM | POA: Diagnosis not present

## 2021-02-27 ENCOUNTER — Encounter: Payer: Self-pay | Admitting: Family Medicine

## 2021-03-03 ENCOUNTER — Telehealth: Payer: Self-pay | Admitting: Hematology

## 2021-03-03 NOTE — Telephone Encounter (Signed)
Spoke to patient to confirm morning clinic appointment for 12/28, packet will be sent via email

## 2021-03-05 ENCOUNTER — Encounter: Payer: Self-pay | Admitting: *Deleted

## 2021-03-05 DIAGNOSIS — D0511 Intraductal carcinoma in situ of right breast: Secondary | ICD-10-CM | POA: Insufficient documentation

## 2021-03-06 DIAGNOSIS — G4733 Obstructive sleep apnea (adult) (pediatric): Secondary | ICD-10-CM | POA: Diagnosis not present

## 2021-03-11 NOTE — Progress Notes (Signed)
Radiation Oncology         [phone] ________________________________  Multidisciplinary Breast Oncology Clinic [facility]) Initial Outpatient Consultation  Name: Natalie Rice MRN: [id number]  Date: 03/12/2021  DOB: [birth year]  [id number], Mervyn Gay, MD  Coralie Keens, MD   REFERRING PHYSICIAN: Coralie Keens, MD  DIAGNOSIS: The encounter diagnosis was Ductal carcinoma in situ (DCIS) of right breast.  Stage 0 Right Breast, high-grade ductal carcinoma in-situ, ER+ / PR+ / Her2 not assessed    ICD-10-CM   1. Ductal carcinoma in situ (DCIS) of right breast  D05.11       HISTORY OF PRESENT ILLNESS::[name] is a 65 y.o. female who is presenting to the office today for evaluation of her newly diagnosed breast cancer. She is accompanied by her husband. She is doing well overall.   She had routine screening mammography on 01/23/21 showing indeterminate calcifications in the right breast. She underwent unilateral right diagnostic mammography with tomography at [facility] on 02/12/21 which further revealed the grouped fine linear calcifications in the right breast as suspicious, and measuring 1.2 cm.  Right breast biopsy at the 9 o'clock position, 5.5 cmfn, on 02/26/21, showed: high grade ductal carcinoma in-situ with necrosis and calcifications, measuring 0.1 cm in the greatest linear extent. Prognostic indicators significant for: estrogen receptor, 90% positive with moderate staining intensity, and progesterone receptor, 40% positive with strong-moderate staining intensity. HER2 not assessed.   Menarche: 65 years old LMP: 2014 Contraceptive: Yes, in 2000, though not indicated for how long on her provided form  HRT: never used   The patient was referred today for presentation in the multidisciplinary conference.  Radiology studies and pathology slides were presented there for review and discussion of treatment options.  A consensus was discussed regarding potential next  steps.  PREVIOUS RADIATION THERAPY: No  PAST MEDICAL HISTORY:  Past Medical History:  Diagnosis Date   Breast cancer ([place])    Colon polyp    Diabetes mellitus without complication ([place])    Eosinophilia 03/03/2012   Hay fever    High blood pressure    High cholesterol    Hyperlipidemia     PAST SURGICAL HISTORY: Past Surgical History:  Procedure Laterality Date   TUBAL LIGATION      FAMILY HISTORY:  Family History  Problem Relation Age of Onset   Cancer Mother 75       cervical cancer   Heart disease Father 57       MI   Heart disease Brother 92       MI   Diabetes Maternal Grandmother    Diabetes Maternal Grandfather     SOCIAL HISTORY:  Social History   Socioeconomic History   Marital status: Married    Spouse name: Not on file   Number of children: 0   Years of education: Not on file   Highest education level: Not on file  Occupational History    Employer: [employer]    Comment: [employer]  Tobacco Use   Smoking status: Former    Packs/day: 1.00    Years: 3.00    Pack years: 3.00    Types: Cigarettes    Quit date: 03/16/2005    Years since quitting: 16.0   Smokeless tobacco: Never   Tobacco comments:    quit 2007  Substance and Sexual Activity   Alcohol use: Yes    Alcohol/week: 0.0 standard drinks    Comment: occassionally   Drug use: No   Sexual activity:  Not Currently  Other Topics Concern   Not on file  Social History Narrative   Not on file   Social Determinants of Health   Financial Resource Strain: Not on file  Food Insecurity: No Food Insecurity   Worried About Running Out of Food in the Last Year: Never true   Ran Out of Food in the Last Year: Never true  Transportation Needs: No Transportation Needs   Lack of Transportation (Medical): No   Lack of Transportation (Non-Medical): No  Physical Activity: Not on file  Stress: Not on file  Social Connections: Not on file    ALLERGIES: No Known Allergies  MEDICATIONS:   Current Outpatient Medications  Medication Sig Dispense Refill   albuterol (VENTOLIN HFA) 108 (90 Base) MCG/ACT inhaler Inhale 2 puffs into the lungs every 6 (six) hours as needed for wheezing or shortness of breath. (Patient not taking: Reported on 03/12/2021) 1 each 0   APPLE CIDER VINEGAR PO Take 2 each by mouth daily. GOLI (Patient not taking: Reported on 03/12/2021)     atorvastatin (LIPITOR) 10 MG tablet Take 1 tablet (10 mg total) by mouth daily. 90 tablet 3   Cholecalciferol (VITAMIN D3) 5000 UNITS CAPS Take 1 capsule by mouth daily.     clotrimazole-betamethasone (LOTRISONE) cream Apply 1 application topically 2 (two) times daily. 30 g 1   Coenzyme Q10 200 MG capsule Take 200 mg by mouth daily.     Loratadine 10 MG CAPS Take by mouth daily as needed.     losartan-hydrochlorothiazide (HYZAAR) 50-12.5 MG tablet TAKE 1 TABLET DAILY (NEEDS OFFICE VISIT WITH DOCTOR BEDSOLE) 90 tablet 3   Omega-3 Fatty Acids (OMEGA 3 PO) Take 1 capsule by mouth daily.     No current facility-administered medications for this encounter.    REVIEW OF SYSTEMS: A 10+ POINT REVIEW OF SYSTEMS WAS OBTAINED including neurology, dermatology, psychiatry, cardiac, respiratory, lymph, extremities, GI, GU, musculoskeletal, constitutional, reproductive, HEENT. On the provided form, she reports wearing glasses, dentures, Hx of gum disease, and hot flashes. She denies any other symptoms.    PHYSICAL EXAM:    Vitals with BMI 03/12/2021  Height   Weight 179 lbs  BMI   Systolic 481  Diastolic 83  Pulse 73    Lungs are clear to auscultation bilaterally. Heart has regular rate and rhythm. No palpable cervical, supraclavicular, or axillary adenopathy. Abdomen soft, non-tender, normal bowel sounds. Breast: Left breast with no palpable mass, nipple discharge, or bleeding. Right breast with mild bruising in the 9 o'clock position in the right breast, no dominant mass appreciated, nipple discharge or bleeding.   KPS =  90  100 - Normal; no complaints; no evidence of disease. 90   - Able to carry on normal activity; minor signs or symptoms of disease. 80   - Normal activity with effort; some signs or symptoms of disease. 26   - Cares for self; unable to carry on normal activity or to do active work. 60   - Requires occasional assistance, but is able to care for most of his personal needs. 50   - Requires considerable assistance and frequent medical care. 45   - Disabled; requires special care and assistance. 46   - Severely disabled; hospital admission is indicated although death not imminent. 35   - Very sick; hospital admission necessary; active supportive treatment necessary. 10   - Moribund; fatal processes progressing rapidly. 0     - Dead  Karnofsky DA, Abelmann WH, Craver LS and  Burchenal JH [phone]) The use of the nitrogen mustards in the palliative treatment of carcinoma: with particular reference to bronchogenic carcinoma Cancer 1 634-56  LABORATORY DATA:  Lab Results  Component Value Date   WBC 7.4 03/12/2021   HGB 15.1 (H) 03/12/2021   HCT 44.9 03/12/2021   MCV 83.9 03/12/2021   PLT 331 03/12/2021   Lab Results  Component Value Date   NA 139 03/12/2021   K 3.5 03/12/2021   CL 103 03/12/2021   CO2 26 03/12/2021   Lab Results  Component Value Date   ALT 24 03/12/2021   AST 18 03/12/2021   ALKPHOS 65 03/12/2021   BILITOT 0.7 03/12/2021    PULMONARY FUNCTION TEST:   Recent Review Flowsheet Data   There is no flowsheet data to display.     RADIOGRAPHY: No results found.    IMPRESSION: Stage 0 Right Breast, high-grade ductal carcinoma in-situ, ER+ / PR+ / Her2 not assessed   Patient will be a good candidate for breast conservation with radiotherapy to the right breast. We discussed the general course of radiation, potential side effects, and toxicities with radiation and the patient is interested in this approach.   PLAN:  Right lumpectomy  Adjuvant radiation therapy    Aromatase inhibitor    ------------------------------------------------  Blair Promise, PhD, MD  This document serves as a record of services personally performed by Gery Pray, MD. It was created on his behalf by Roney Mans, a trained medical scribe. The creation of this record is based on the scribe's personal observations and the provider's statements to them. This document has been checked and approved by the attending provider.

## 2021-03-12 ENCOUNTER — Encounter: Payer: Self-pay | Admitting: Genetic Counselor

## 2021-03-12 ENCOUNTER — Inpatient Hospital Stay: Payer: BC Managed Care – PPO | Attending: Hematology | Admitting: Hematology

## 2021-03-12 ENCOUNTER — Encounter: Payer: Self-pay | Admitting: *Deleted

## 2021-03-12 ENCOUNTER — Ambulatory Visit: Payer: BC Managed Care – PPO | Admitting: Physical Therapy

## 2021-03-12 ENCOUNTER — Other Ambulatory Visit: Payer: Self-pay | Admitting: Surgery

## 2021-03-12 ENCOUNTER — Ambulatory Visit
Admission: RE | Admit: 2021-03-12 | Discharge: 2021-03-12 | Disposition: A | Discharge status: Home / Self Care | Payer: BC Managed Care – PPO | Source: Ambulatory Visit | Attending: Radiation Oncology | Admitting: Radiation Oncology

## 2021-03-12 ENCOUNTER — Other Ambulatory Visit: Payer: Self-pay

## 2021-03-12 ENCOUNTER — Encounter: Payer: Self-pay | Admitting: Hematology

## 2021-03-12 ENCOUNTER — Inpatient Hospital Stay: Payer: BC Managed Care – PPO

## 2021-03-12 ENCOUNTER — Inpatient Hospital Stay: Payer: BC Managed Care – PPO | Admitting: Licensed Clinical Social Worker

## 2021-03-12 VITALS — BP 144/83 | HR 73 | Temp 97.3°F | Resp 17 | Wt 179.0 lb

## 2021-03-12 DIAGNOSIS — D0511 Intraductal carcinoma in situ of right breast: Secondary | ICD-10-CM

## 2021-03-12 DIAGNOSIS — Z87891 Personal history of nicotine dependence: Secondary | ICD-10-CM | POA: Diagnosis not present

## 2021-03-12 DIAGNOSIS — Z8049 Family history of malignant neoplasm of other genital organs: Secondary | ICD-10-CM | POA: Diagnosis not present

## 2021-03-12 DIAGNOSIS — Z1379 Encounter for other screening for genetic and chromosomal anomalies: Secondary | ICD-10-CM | POA: Insufficient documentation

## 2021-03-12 DIAGNOSIS — Z170 Estrogen receptor positive status [ER+]: Secondary | ICD-10-CM | POA: Diagnosis not present

## 2021-03-12 LAB — CMP (CANCER CENTER ONLY)
ALT: 24 U/L (ref 0–44)
AST: 18 U/L (ref 15–41)
Albumin: 4.4 g/dL (ref 3.5–5.0)
Alkaline Phosphatase: 65 U/L (ref 38–126)
Anion gap: 10 (ref 5–15)
BUN: 10 mg/dL (ref 8–23)
CO2: 26 mmol/L (ref 22–32)
Calcium: 10.1 mg/dL (ref 8.9–10.3)
Chloride: 103 mmol/L (ref 98–111)
Creatinine: 0.92 mg/dL (ref 0.44–1.00)
GFR, Estimated: >60 mL/min (ref >60)
Glucose, Bld: 111 mg/dL — ABNORMAL HIGH (ref 70–99)
Potassium: 3.5 mmol/L (ref 3.5–5.1)
Sodium: 139 mmol/L (ref 135–145)
Total Bilirubin: 0.7 mg/dL (ref 0.3–1.2)
Total Protein: 7.5 g/dL (ref 6.5–8.1)

## 2021-03-12 LAB — CBC WITH DIFFERENTIAL (CANCER CENTER ONLY)
Abs Immature Granulocytes: 0.02 10*3/uL (ref 0.00–0.07)
Basophils Absolute: 0.1 10*3/uL (ref 0.0–0.1)
Basophils Relative: 1 %
Eosinophils Absolute: 0.5 10*3/uL (ref 0.0–0.5)
Eosinophils Relative: 6 %
HCT: 44.9 % (ref 36.0–46.0)
Hemoglobin: 15.1 g/dL — ABNORMAL HIGH (ref 12.0–15.0)
Immature Granulocytes: 0 %
Lymphocytes Relative: 27 %
Lymphs Abs: 2 10*3/uL (ref 0.7–4.0)
MCH: 28.2 pg (ref 26.0–34.0)
MCHC: 33.6 g/dL (ref 30.0–36.0)
MCV: 83.9 fL (ref 80.0–100.0)
Monocytes Absolute: 0.6 10*3/uL (ref 0.1–1.0)
Monocytes Relative: 8 %
Neutro Abs: 4.3 10*3/uL (ref 1.7–7.7)
Neutrophils Relative %: 58 %
Platelet Count: 331 10*3/uL (ref 150–400)
RBC: 5.35 MIL/uL — ABNORMAL HIGH (ref 3.87–5.11)
RDW: 12.5 % (ref 11.5–15.5)
WBC Count: 7.4 10*3/uL (ref 4.0–10.5)
nRBC: 0 % (ref 0.0–0.2)

## 2021-03-12 LAB — GENETIC SCREENING ORDER

## 2021-03-12 NOTE — Progress Notes (Signed)
[facility]  Initial Assessment   Natalie Rice is a 65 y.o. year old female accompanied by husband, Herbie [place]. Clinical Social Work was referred by [facility] for assessment of psychosocial needs.   SDOH (Social Determinants of Health) assessments performed: Yes SDOH Interventions    Flowsheet Row Most Recent Value  SDOH Interventions   Food Insecurity Interventions Intervention Not Indicated  Housing Interventions Intervention Not Indicated  Transportation Interventions Intervention Not Indicated       Distress Screen completed: Yes ONCBCN DISTRESS SCREENING 03/12/2021  Screening Type Initial Screening  Distress experienced in past week (1-10) 1      Family/Social Information:  Housing Arrangement: patient lives with husband Family members/support persons in your life? 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 Income source: Employment  Financial concerns: No Type of concern: None Food access concerns: no Religious or spiritual practice: yes, faith and prayer are very important and helpful for patient Medication Concerns: no  Services Currently in place:  n/a  Coping/ Adjustment to diagnosis: Patient understands treatment plan and what happens next? yes Concerns about diagnosis and/or treatment: I'm not especially worried about anything Patient reported stressors:  some general adjustment to diagnosis Current coping skills/ strengths: Average or above average intelligence , Capable of independent living , Motivation for treatment/growth , Religious Affiliation , and Supportive family/friends     SUMMARY: Current SDOH Barriers:  No significant SDOH barriers at this time  Clinical Social Work Clinical Goal(s):  No clinical SW goals at this time  Interventions: Discussed the importance of support during treatment Informed patient of the support team roles and support  services at [facility] Provided CSW contact information and encouraged patient to call with any questions or concerns  Follow Up Plan: Patient will contact CSW with any support or resource needs Patient verbalizes understanding of plan: Yes    Christeen Douglas LCSW

## 2021-03-12 NOTE — Progress Notes (Addendum)
Natalie Rice   [phone] Fax:(336) [place] Note   Patient Care Team: Jinny Sanders, MD as PCP - General (Family Medicine) Rockwell Germany, RN as Oncology Nurse Navigator Mauro Kaufmann, RN as Oncology Nurse Navigator Truitt Merle, MD as Consulting Physician (Medical Oncology) Gery Pray, MD as Consulting Physician (Radiation Oncology) Coralie Keens, MD as Consulting Physician (General Surgery)  Date of Service:  03/12/2021   CHIEF COMPLAINTS/PURPOSE OF CONSULTATION:  Right Breast DCIS, ER+  REFERRING PHYSICIAN:  [name]   ASSESSMENT & PLAN:  Natalie Rice is a 65 y.o. postmenopausal female with  1. Right breast DCIS, grade 3, ER+/PR+ -found on screening mammogram on 01/23/21. Right diagnostic MM on 02/12/21 showed 1.2 cm grouped calcifications at 9 o'clock. Biopsy on 02/26/21 confirmed high grade DCIS. -I discussed her breast imaging and needle biopsy results with patient and her family members in great detail. -She is a candidate for breast conservation surgery. She has been seen by breast surgeon Dr. [name], who recommends lumpectomy. -Her DCIS will be cured by complete surgical resection. Any form of adjuvant therapy is preventive. -I reviewed her risk and treatment benefits using the Breast Cancer Nomogram from [facility]). Based on her DCIS, she has a 14% risk of developing future breast cancer in the next 10 years. Her risk would drop to 6% with RT alone or 7% with Antiestrogen therapy alone. With both adjuvant treatments her risk would decrease to 3%.  ---I recommend adjuvant endocrine therapy with tamoxifen or Anastrozole. The potential side effects of Tamoxifen, which includes but not limited to, hot flash, skin and vaginal dryness, slightly increased risk of cardiovascular disease and cataract, small risk of thrombosis and endometrial cancer, were discussed with her in great details.  Preventive strategies for thrombosis, such as being physically active, using compression stocks, avoid cigarette smoking, etc., were reviewed with her. I also recommend her to follow-up with her gynecologist once a year, and watch for vaginal spotting or bleeding, as a clinically sign of endometrial cancer, etc.  -The potential benefit and side effects of anastrozole, which includes but not limited to, hot flash, skin and vaginal dryness, metabolic changes ( increased blood glucose, cholesterol, weight, etc.), slightly in increased risk of cardiovascular disease, cataracts, muscular and joint discomfort, osteopenia and osteoporosis, etc, were discussed with her in great details.  -After prolonged discussion of radiation versus antiestrogen therapy, she is leaning towards radiation alone.  She is concerned about side effects from antiestrogen therapy. -We also reviewed the breast cancer surveillance, including annual mammogram, and physical exam.    PLAN:  -She will likely have lumpectomy soon -I'll see her the last week of radiation, to review antiestrogen therapy again.  She is leaning towards radiation alone at this point.   Oncology History Overview Note   Cancer Staging  Ductal carcinoma in situ (DCIS) of right breast Staging form: Breast, AJCC 8th Edition - Clinical stage from 03/12/2021: Stage 0 (cTis (DCIS), cN0, cM0, ER+, PR+) - Signed by Truitt Merle, MD on 03/12/2021 Stage prefix: Initial diagnosis Nuclear grade: G3     Ductal carcinoma in situ (DCIS) of right breast  02/12/2021 Mammogram   Exam: 3D Mammogram Diagnotic - Right  IMPRESSION: The 1.2 cm grouped fine linear calcifications in the right breast are suspicious.   02/26/2021 Initial Biopsy   Diagnosis Breast, right, needle core biopsy, 9 o'clock, 5.5 cmfn - DUCTAL CARCINOMA IN SITU WITH NECROSIS AND CALCIFICATIONS -  SEE COMMENT Microscopic Comment Based on the biopsy, the ductal carcinoma in situ has a comedo pattern,  high nuclear grade and measures 0.1 cm in greatest linear extent.  PROGNOSTIC INDICATORS Results: Estrogen Receptor: 90%, POSITIVE, MODERATE STAINING INTENSITY Progesterone Receptor: 40%, POSITIVE, STRONG-MODERATE STAINING INTENSITY   03/05/2021 Initial Diagnosis   Ductal carcinoma in situ (DCIS) of right breast      HISTORY OF PRESENTING ILLNESS:  Natalie L. Service 65 y.o. female is a here because of breast cancer. The patient was referred by [facility]. The patient presents to the clinic today accompanied by her husband.   She had routine screening mammography on 01/23/21 showing a possible abnormality in the right breast. She underwent right diagnostic mammography on 02/12/21 showing: 1.2 cm grouped linear calcifications in right breast at 9 o'clock.  Biopsy on 02/26/21 showed: DCIS with necrosis and calcifications. Prognostic indicators significant for: estrogen receptor, 90% positive and progesterone receptor, 40% positive.    Today the patient notes they felt/feeling prior/after... -she denies pain or any other complaints today.  She has a PMHx of.... -HTN  Socially... -she is married, no children -she reports cervical cancer in her mother at age 77  GYN HISTORY  26: 80 years old LMP: ~2017 Contraceptive: HRT:  GP: 0   REVIEW OF SYSTEMS:    Constitutional: Denies fevers, chills or abnormal night sweats Eyes: Denies blurriness of vision, double vision or watery eyes Ears, nose, mouth, throat, and face: Denies mucositis or sore throat Respiratory: Denies cough, dyspnea or wheezes Cardiovascular: Denies palpitation, chest discomfort or lower extremity swelling Gastrointestinal:  Denies nausea, heartburn or change in bowel habits Skin: Denies abnormal skin rashes Lymphatics: Denies new lymphadenopathy or easy bruising Neurological:Denies numbness, tingling or new weaknesses Behavioral/Psych: Mood is stable, no new changes  All other systems were reviewed with the  patient and are negative.   MEDICAL HISTORY:  Past Medical History:  Diagnosis Date   Breast cancer ([place])    Colon polyp    Diabetes mellitus without complication ([place])    Eosinophilia 03/03/2012   Hay fever    High blood pressure    High cholesterol    Hyperlipidemia     SURGICAL HISTORY: Past Surgical History:  Procedure Laterality Date   TUBAL LIGATION      SOCIAL HISTORY: Social History   Socioeconomic History   Marital status: Married    Spouse name: Not on file   Number of children: 0   Years of education: Not on file   Highest education level: Not on file  Occupational History    Employer: [employer]    Comment: [employer]  Tobacco Use   Smoking status: Former    Packs/day: 1.00    Years: 3.00    Pack years: 3.00    Types: Cigarettes    Quit date: 03/16/2005    Years since quitting: 16.0   Smokeless tobacco: Never   Tobacco comments:    quit 2007  Substance and Sexual Activity   Alcohol use: Yes    Alcohol/week: 0.0 standard drinks    Comment: occassionally   Drug use: No   Sexual activity: Not Currently  Other Topics Concern   Not on file  Social History Narrative   Not on file   Social Determinants of Health   Financial Resource Strain: Not on file  Food Insecurity: No Food Insecurity   Worried About Running Out of Food in the Last Year: Never true   Ran Out of Food in  the Last Year: Never true  Transportation Needs: No Transportation Needs   Lack of Transportation (Medical): No   Lack of Transportation (Non-Medical): No  Physical Activity: Not on file  Stress: Not on file  Social Connections: Not on file  Intimate Partner Violence: Not on file    FAMILY HISTORY: Family History  Problem Relation Age of Onset   Cancer Mother 77       cervical cancer   Heart disease Father 71       MI   Heart disease Brother 23       MI   Diabetes Maternal Grandmother    Diabetes Maternal Grandfather     ALLERGIES:  has No Known  Allergies.  MEDICATIONS:  Current Outpatient Medications  Medication Sig Dispense Refill   atorvastatin (LIPITOR) 10 MG tablet Take 1 tablet (10 mg total) by mouth daily. 90 tablet 3   Cholecalciferol (VITAMIN D3) 5000 UNITS CAPS Take 1 capsule by mouth daily.     clotrimazole-betamethasone (LOTRISONE) cream Apply 1 application topically 2 (two) times daily. 30 g 1   Coenzyme Q10 200 MG capsule Take 200 mg by mouth daily.     Loratadine 10 MG CAPS Take by mouth daily as needed.     losartan-hydrochlorothiazide (HYZAAR) 50-12.5 MG tablet TAKE 1 TABLET DAILY (NEEDS OFFICE VISIT WITH DOCTOR BEDSOLE) 90 tablet 3   Omega-3 Fatty Acids (OMEGA 3 PO) Take 1 capsule by mouth daily.     albuterol (VENTOLIN HFA) 108 (90 Base) MCG/ACT inhaler Inhale 2 puffs into the lungs every 6 (six) hours as needed for wheezing or shortness of breath. (Patient not taking: Reported on 03/12/2021) 1 each 0   APPLE CIDER VINEGAR PO Take 2 each by mouth daily. GOLI (Patient not taking: Reported on 03/12/2021)     No current facility-administered medications for this visit.    PHYSICAL EXAMINATION: ECOG PERFORMANCE STATUS: 0 - Asymptomatic  Vitals:   03/12/21 0844  BP: (!) 144/83  Pulse: 73  Resp: 17  Temp: (!) 97.3 F (36.3 C)  SpO2: 95%   Filed Weights   03/12/21 0844  Weight: 179 lb (81.2 kg)    GENERAL:alert, no distress and comfortable SKIN: skin color, texture, turgor are normal, no rashes or significant lesions EYES: normal, Conjunctiva are pink and non-injected, sclera clear  NECK: supple, thyroid normal size, non-tender, without nodularity LYMPH:  no palpable lymphadenopathy in the cervical, axillary  LUNGS: clear to auscultation and percussion with normal breathing effort HEART: regular rate & rhythm and no murmurs and no lower extremity edema ABDOMEN:abdomen soft, non-tender and normal bowel sounds Musculoskeletal:no cyanosis of digits and no clubbing  NEURO: alert & oriented x 3 with fluent  speech, no focal motor/sensory deficits BREAST: bruising from biopsy. No palpable mass, nodules or adenopathy bilaterally.   LABORATORY DATA:  I have reviewed the data as listed CBC Latest Ref Rng & Units 03/12/2021 09/15/2012 09/10/2012  WBC 4.0 - 10.5 K/uL 7.4 9.3 7.6  Hemoglobin 12.0 - 15.0 g/dL 15.1(H) 14.0 13.6  Hematocrit 36.0 - 46.0 % 44.9 42.6 41.2  Platelets 150 - 400 K/uL 331 336.0 278    CMP Latest Ref Rng & Units 03/12/2021 05/24/2020 05/02/2019  Glucose 70 - 99 mg/dL 111(H) 110(H) 95  BUN 8 - 23 mg/dL 10 12 13   Creatinine 0.44 - 1.00 mg/dL 0.92 0.99 0.91  Sodium 135 - 145 mmol/L 139 138 137  Potassium 3.5 - 5.1 mmol/L 3.5 4.0 3.8  Chloride 98 - 111 mmol/L 103  101 101  CO2 22 - 32 mmol/L 26 29 31   Calcium 8.9 - 10.3 mg/dL 10.1 10.2 10.2  Total Protein 6.5 - 8.1 g/dL 7.5 7.4 7.8  Total Bilirubin 0.3 - 1.2 mg/dL 0.7 0.5 0.4  Alkaline Phos 38 - 126 U/L 65 76 86  AST 15 - 41 U/L 18 17 20   ALT 0 - 44 U/L 24 20 24      RADIOGRAPHIC STUDIES: I have personally reviewed the radiological images as listed and agreed with the findings in the report. No results found.   No orders of the defined types were placed in this encounter.   All questions were answered. The patient knows to call the clinic with any problems, questions or concerns. The total time spent in the appointment was 60 minutes.     Truitt Merle, MD 03/12/2021   I, Wilburn Mylar, am acting as scribe for Truitt Merle, MD.   I have reviewed the above documentation for accuracy and completeness, and I agree with the above.

## 2021-03-18 ENCOUNTER — Other Ambulatory Visit: Payer: Self-pay | Admitting: *Deleted

## 2021-03-18 MED ORDER — LOSARTAN POTASSIUM-HCTZ 50-12.5 MG PO TABS: ORAL_TABLET | ORAL | 0 refills | Status: DC

## 2021-03-18 MED ORDER — ATORVASTATIN CALCIUM 10 MG PO TABS: 10.0000 mg | ORAL_TABLET | Freq: Every evening | ORAL | 0 refills | Status: DC

## 2021-03-18 NOTE — Telephone Encounter (Signed)
Please schedule CPE with fasting labs prior with Dr. Diona Browner for sometime after 05/29/2021.

## 2021-03-19 NOTE — Progress Notes (Addendum)
Surgical Instructions    Your procedure is scheduled on 03/27/21.  Report to [facility] Main Entrance "A" at 6:45 A.M., then check in with the Admitting office.  Call this number if you have problems the morning of surgery:  [phone]   If you have any questions prior to your surgery date call [phone]: Open Monday-Friday 8am-4pm    Remember:  Do not eat after midnight the night before your surgery  You may drink clear liquids until 5:45 the morning of your surgery.   Clear liquids allowed are: Water, Non-Citrus Juices (without pulp), Carbonated Beverages, Clear Tea, Black Coffee ONLY (NO MILK, CREAM OR POWDERED CREAMER of any kind), and Gatorade  Please complete your PRE-SURGERY gatorade that was provided to you by 5:45 the morning of surgery.  Please, if able, drink it in one setting. DO NOT SIP.     Take these medicines the morning of surgery with A SIP OF WATER: Loratadine    As of today, STOP taking any Aspirin (unless otherwise instructed by your surgeon) Aleve, Naproxen, Ibuprofen, Motrin, Advil, Goody's, BC's, all herbal medications, fish oil, and all vitamins.  WHAT DO I DO ABOUT MY DIABETES MEDICATION?   Do not take oral diabetes medicines (pills) the morning of surgery.   HOW TO MANAGE YOUR DIABETES BEFORE AND AFTER SURGERY  Why is it important to control my blood sugar before and after surgery? Improving blood sugar levels before and after surgery helps healing and can limit problems. A way of improving blood sugar control is eating a healthy diet by:  Eating less sugar and carbohydrates  Increasing activity/exercise  Talking with your doctor about reaching your blood sugar goals High blood sugars (greater than 180 mg/dL) can raise your risk of infections and slow your recovery, so you will need to focus on controlling your diabetes during the weeks before surgery. Make sure that the doctor who takes care of your diabetes knows about your planned surgery  including the date and location.  How do I manage my blood sugar before surgery? Check your blood sugar at least 4 times a day, starting 2 days before surgery, to make sure that the level is not too high or low.  Check your blood sugar the morning of your surgery when you wake up and every 2 hours until you get to the Short Stay unit.  If your blood sugar is less than 70 mg/dL, you will need to treat for low blood sugar: Do not take insulin. Treat a low blood sugar (less than 70 mg/dL) with  cup of clear juice (cranberry or apple), 4 glucose tablets, OR glucose gel. Recheck blood sugar in 15 minutes after treatment (to make sure it is greater than 70 mg/dL). If your blood sugar is not greater than 70 mg/dL on recheck, call [phone] for further instructions. Report your blood sugar to the short stay nurse when you get to Short Stay.  If you are admitted to the hospital after surgery: Your blood sugar will be checked by the staff and you will probably be given insulin after surgery (instead of oral diabetes medicines) to make sure you have good blood sugar levels. The goal for blood sugar control after surgery is 80-180 mg/dL.            Do not wear jewelry or makeup Do not wear lotions, powders, perfumes or deodorant. Do not shave 48 hours prior to surgery.   Do not bring valuables to the hospital. DO Not wear nail  polish, gel polish, artificial nails, or any other type of covering on natural nails including finger and toenails. If patients have artificial nails, gel coating, etc. that need to be removed by a nail salon, please have this removed prior to surgery or surgery may need to be canceled/delayed if the surgeon/ anesthesia feels like the patient is unable to be adequately monitored.             [facility] is not responsible for any belongings or valuables.  Do NOT Smoke (Tobacco/Vaping)  24 hours prior to your procedure  If you use a CPAP at night, you may bring your mask  for your overnight stay.   Contacts, glasses, hearing aids, dentures or partials may not be worn into surgery, please bring cases for these belongings   For patients admitted to the hospital, discharge time will be determined by your treatment team.   Patients discharged the day of surgery will not be allowed to drive home, and someone needs to stay with them for 24 hours.  NO VISITORS WILL BE ALLOWED IN PRE-OP WHERE PATIENTS ARE PREPPED FOR SURGERY.  ONLY 1 SUPPORT PERSON MAY BE PRESENT IN THE WAITING ROOM WHILE YOU ARE IN SURGERY.  IF YOU ARE TO BE ADMITTED, ONCE YOU ARE IN YOUR ROOM YOU WILL BE ALLOWED TWO (2) VISITORS. 1 (ONE) VISITOR MAY STAY OVERNIGHT BUT MUST ARRIVE TO THE ROOM BY 8pm.  Minor children may have two parents present. Special consideration for safety and communication needs will be reviewed on a case by case basis.  Special instructions:    Oral Hygiene is also important to reduce your risk of infection.  Remember - BRUSH YOUR TEETH THE MORNING OF SURGERY WITH YOUR REGULAR TOOTHPASTE   [place]- Preparing For Surgery  Before surgery, you can play an important role. Because skin is not sterile, your skin needs to be as free of germs as possible. You can reduce the number of germs on your skin by washing with CHG (chlorahexidine gluconate) Soap before surgery.  CHG is an antiseptic cleaner which kills germs and bonds with the skin to continue killing germs even after washing.     Please do not use if you have an allergy to CHG or antibacterial soaps. If your skin becomes reddened/irritated stop using the CHG.  Do not shave (including legs and underarms) for at least 48 hours prior to first CHG shower. It is OK to shave your face.  Please follow these instructions carefully.     Shower the NIGHT BEFORE SURGERY and the MORNING OF SURGERY with CHG Soap.   If you chose to wash your hair, wash your hair first as usual with your normal shampoo. After you shampoo, rinse your  hair and body thoroughly to remove the shampoo.  Then [employer] and genitals (private parts) with your normal soap and rinse thoroughly to remove soap.  After that Use CHG Soap as you would any other liquid soap. You can apply CHG directly to the skin and wash gently with a scrungie or a clean washcloth.   Apply the CHG Soap to your body ONLY FROM THE NECK DOWN.  Do not use on open wounds or open sores. Avoid contact with your eyes, ears, mouth and genitals (private parts). Wash Face and genitals (private parts)  with your normal soap.   Wash thoroughly, paying special attention to the area where your surgery will be performed.  Thoroughly rinse your body with warm water from the neck  down.  DO NOT shower/wash with your normal soap after using and rinsing off the CHG Soap.  Pat yourself dry with a CLEAN TOWEL.  Wear CLEAN PAJAMAS to bed the night before surgery  Place CLEAN SHEETS on your bed the night before your surgery  DO NOT SLEEP WITH PETS.   Day of Surgery:  Take a shower with CHG soap. Wear Clean/Comfortable clothing the morning of surgery Do not apply any deodorants/lotions.   Remember to brush your teeth WITH YOUR REGULAR TOOTHPASTE.   Please read over the following fact sheets that you were given.

## 2021-03-20 ENCOUNTER — Other Ambulatory Visit (HOSPITAL_COMMUNITY): Payer: BC Managed Care – PPO

## 2021-03-20 ENCOUNTER — Telehealth: Payer: Self-pay | Admitting: *Deleted

## 2021-03-20 ENCOUNTER — Encounter: Payer: Self-pay | Admitting: *Deleted

## 2021-03-20 ENCOUNTER — Encounter (HOSPITAL_COMMUNITY): Payer: Self-pay

## 2021-03-20 ENCOUNTER — Other Ambulatory Visit: Payer: Self-pay

## 2021-03-20 ENCOUNTER — Encounter: Payer: Self-pay | Admitting: Family Medicine

## 2021-03-20 ENCOUNTER — Encounter (HOSPITAL_COMMUNITY)
Admission: RE | Admit: 2021-03-20 | Discharge: 2021-03-20 | Disposition: A | Discharge status: Home / Self Care | Payer: BC Managed Care – PPO | Source: Ambulatory Visit | Attending: Surgery | Admitting: Surgery

## 2021-03-20 VITALS — BP 165/90 | HR 60 | Temp 98.3°F | Resp 18 | Ht 59.0 in | Wt 182.0 lb

## 2021-03-20 DIAGNOSIS — Z01818 Encounter for other preprocedural examination: Secondary | ICD-10-CM | POA: Insufficient documentation

## 2021-03-20 DIAGNOSIS — D0511 Intraductal carcinoma in situ of right breast: Secondary | ICD-10-CM

## 2021-03-20 HISTORY — DX: Sleep apnea, unspecified: G47.30

## 2021-03-20 LAB — HEMOGLOBIN A1C
Hgb A1c MFr Bld: 6.1 % — ABNORMAL HIGH (ref 4.8–5.6)
Mean Plasma Glucose: 128.37 mg/dL

## 2021-03-20 LAB — GLUCOSE, CAPILLARY: Glucose-Capillary: 95 mg/dL (ref 70–99)

## 2021-03-20 NOTE — Progress Notes (Signed)
PCP: Eliezer Lofts, MD Cardiologist: denies  EKG: 03/20/21 CXR: na ECHO: denies Stress Test: denies Cardiac Cath: denies  Fasting Blood Sugar- does not check glucose Checks Blood Sugar__0_ times a day Patient does not take any diabetes medications.  Does not have glucose meter.   ASA/Blood Thinner: No  OSA: Yes, just received diagnosis yesterday 03/19/21.  She was told she has moderate OSA. Will probably be getting CPAP.   Covid test not needed.   Seed Placement 03/26/21.  Anesthesia Review: No  Patient denies shortness of breath, fever, cough, and chest pain at PAT appointment.  Patient verbalized understanding of instructions provided today at the PAT appointment.  Patient asked to review instructions at home and day of surgery.

## 2021-03-20 NOTE — Telephone Encounter (Signed)
Called patient no answer. Sent mychart message

## 2021-03-20 NOTE — Telephone Encounter (Signed)
Spoke to pt concerning Natalie Rice from 12.28.22. Denies questions or concerns regarding dx or treatment care plan. Encourage pt to call with needs. Received verbal understanding.

## 2021-03-21 DIAGNOSIS — K219 Gastro-esophageal reflux disease without esophagitis: Secondary | ICD-10-CM | POA: Diagnosis not present

## 2021-03-21 DIAGNOSIS — J301 Allergic rhinitis due to pollen: Secondary | ICD-10-CM | POA: Diagnosis not present

## 2021-03-21 DIAGNOSIS — G4733 Obstructive sleep apnea (adult) (pediatric): Secondary | ICD-10-CM | POA: Diagnosis not present

## 2021-03-21 NOTE — Telephone Encounter (Signed)
2nd attempt  Called someone picked-up then the phone hung up

## 2021-03-24 ENCOUNTER — Inpatient Hospital Stay
Admission: RE | Admit: 2021-03-24 | Discharge: 2021-03-24 | Disposition: A | Discharge status: Home / Self Care | Payer: Self-pay | Source: Ambulatory Visit | Attending: Radiation Oncology | Admitting: Radiation Oncology

## 2021-03-24 ENCOUNTER — Other Ambulatory Visit: Payer: Self-pay | Admitting: Radiation Oncology

## 2021-03-24 DIAGNOSIS — D0511 Intraductal carcinoma in situ of right breast: Secondary | ICD-10-CM

## 2021-03-26 DIAGNOSIS — D0511 Intraductal carcinoma in situ of right breast: Secondary | ICD-10-CM | POA: Diagnosis not present

## 2021-03-26 NOTE — H&P (Signed)
REFERRING PHYSICIAN: Aneta Mins,*  PROVIDER: Beverlee Nims, MD  MRN: [id number] DOB: [birth year]     History of Present Illness: Natalie Rice is a 66 y.o. female who is seen as an office consultation at the request of Dr. Diona Browner  .   This is a pleasant 66 year old female who was found on recent screening mammography to have abnormal calcifications at the 9 o'clock position of the right breast. These measured approximate 1.2 cm. She underwent a biopsy of this showing ductal carcinoma in situ with necrosis and calcifications. It was 90% ER positive and 40% PR positive. She has a family history of breast cancer in a cousin. She is otherwise healthy without complaints. She has no cardiopulmonary issues. She denies nipple discharge. She has had no previous problems regarding her breast.  Review of Systems: A complete review of systems was obtained from the patient. I have reviewed this information and discussed as appropriate with the patient. See HPI as well for other ROS.  ROS   Medical History: Past Medical History:  Diagnosis Date   Diabetes mellitus without complication (CMS-HCC)   DVT (deep venous thrombosis) (CMS-HCC)   Hyperlipidemia   Hypertension   There is no problem list on file for this patient.  History reviewed. No pertinent surgical history.   No Known Allergies  Current Outpatient Medications on File Prior to Visit  Medication Sig Dispense Refill   atorvastatin (LIPITOR) 10 MG tablet Take 10 mg by mouth once daily   losartan-hydrochlorothiazide (HYZAAR) 50-12.5 mg tablet Take 1 tablet by mouth once daily   No current facility-administered medications on file prior to visit.   Family History  Problem Relation Age of Onset   High blood pressure (Hypertension) Mother   Coronary Artery Disease (Blocked arteries around heart) Father   Hyperlipidemia (Elevated cholesterol) Sister   Coronary Artery Disease (Blocked arteries around heart)  Brother   Hyperlipidemia (Elevated cholesterol) Brother   Diabetes Other   Breast cancer Other    Social History   Tobacco Use  Smoking Status Never  Smokeless Tobacco Never    Social History   Socioeconomic History   Marital status: Unknown  Tobacco Use   Smoking status: Never   Smokeless tobacco: Never  Vaping Use   Vaping Use: Never used  Substance and Sexual Activity   Alcohol use: Yes   Drug use: Never   Objective:   Vitals with BMI  Height    Weight 179 lbs  BMI    Systolic 299  Diastolic 83  Pulse 73   Physical Exam   She appears well on exam  There are no palpable masses in either breast. The nipple areolar complexes are normal.  There is no axillary adenopathy  Lungs clear  CV RRR  Abdomen soft, NT,ND  Neuro grossly intact  Labs, Imaging and Diagnostic Testing: I have reviewed her mammograms, ultrasound, and pathology results    Assessment and Plan:     Neoplasm of right breast, primary tumor staging category Tis: ductal carcinoma in situ (DCIS)    We have discussed her at length in our multidisciplinary breast cancer conference. I then had a long discussion with the patient and her husband. We discussed breast conservation versus mastectomy. She is a candidate for breast conservation. I next discussed proceeding with a radioactive seed guided right breast lumpectomy. I explained the procedure in detail. We discussed the risk which includes but is not limited to bleeding, infection, the need for further surgery if margins  are positive, cardiopulmonary issues, DVT, postoperative recovery, etc. She understands and wished to proceed with surgery which will be scheduled.

## 2021-03-26 NOTE — Anesthesia Preprocedure Evaluation (Addendum)
Anesthesia Evaluation  Patient identified by MRN, date of birth, ID band Patient awake    Reviewed: Allergy & Precautions, NPO status , Patient's Chart, lab work & pertinent test results  History of Anesthesia Complications Negative for: history of anesthetic complications  Airway Mallampati: II  TM Distance: >3 FB Neck ROM: Full    Dental  (+) Dental Advisory Given, Partial Upper, Partial Lower   Pulmonary sleep apnea , former smoker,    Pulmonary exam normal        Cardiovascular hypertension, Pt. on medications Normal cardiovascular exam     Neuro/Psych negative neurological ROS     GI/Hepatic negative GI ROS, Neg liver ROS,   Endo/Other  diabetes  Renal/GU negative Renal ROS     Musculoskeletal negative musculoskeletal ROS (+)   Abdominal   Peds  Hematology negative hematology ROS (+)   Anesthesia Other Findings   Reproductive/Obstetrics                            Anesthesia Physical Anesthesia Plan  ASA: 2  Anesthesia Plan: General   Post-op Pain Management: Celebrex PO (pre-op) and Tylenol PO (pre-op)   Induction:   PONV Risk Score and Plan: Ondansetron, Dexamethasone, Midazolam and Scopolamine patch - Pre-op  Airway Management Planned: LMA  Additional Equipment:   Intra-op Plan:   Post-operative Plan: Extubation in OR  Informed Consent: I have reviewed the patients History and Physical, chart, labs and discussed the procedure including the risks, benefits and alternatives for the proposed anesthesia with the patient or authorized representative who has indicated his/her understanding and acceptance.     Dental advisory given  Plan Discussed with: Anesthesiologist and CRNA  Anesthesia Plan Comments:        Anesthesia Quick Evaluation

## 2021-03-27 ENCOUNTER — Encounter (HOSPITAL_COMMUNITY): Payer: Self-pay | Admitting: Surgery

## 2021-03-27 ENCOUNTER — Ambulatory Visit (HOSPITAL_COMMUNITY)
Admission: RE | Admit: 2021-03-27 | Discharge: 2021-03-27 | Disposition: A | Discharge status: Home / Self Care | Payer: BC Managed Care – PPO | Attending: Surgery | Admitting: Surgery

## 2021-03-27 ENCOUNTER — Ambulatory Visit (HOSPITAL_COMMUNITY): Payer: BC Managed Care – PPO | Admitting: Anesthesiology

## 2021-03-27 ENCOUNTER — Ambulatory Visit (HOSPITAL_COMMUNITY): Payer: BC Managed Care – PPO | Admitting: Physician Assistant

## 2021-03-27 ENCOUNTER — Encounter (HOSPITAL_COMMUNITY)
Admission: RE | Disposition: A | Discharge status: Home / Self Care | Payer: Self-pay | Source: Home / Self Care | Attending: Surgery

## 2021-03-27 DIAGNOSIS — G4730 Sleep apnea, unspecified: Secondary | ICD-10-CM | POA: Diagnosis not present

## 2021-03-27 DIAGNOSIS — N61 Mastitis without abscess: Secondary | ICD-10-CM | POA: Diagnosis not present

## 2021-03-27 DIAGNOSIS — I10 Essential (primary) hypertension: Secondary | ICD-10-CM | POA: Diagnosis not present

## 2021-03-27 DIAGNOSIS — Z87891 Personal history of nicotine dependence: Secondary | ICD-10-CM | POA: Insufficient documentation

## 2021-03-27 DIAGNOSIS — N6081 Other benign mammary dysplasias of right breast: Secondary | ICD-10-CM | POA: Insufficient documentation

## 2021-03-27 DIAGNOSIS — N641 Fat necrosis of breast: Secondary | ICD-10-CM | POA: Diagnosis not present

## 2021-03-27 DIAGNOSIS — D0511 Intraductal carcinoma in situ of right breast: Secondary | ICD-10-CM | POA: Insufficient documentation

## 2021-03-27 DIAGNOSIS — N6011 Diffuse cystic mastopathy of right breast: Secondary | ICD-10-CM | POA: Diagnosis not present

## 2021-03-27 DIAGNOSIS — E119 Type 2 diabetes mellitus without complications: Secondary | ICD-10-CM | POA: Diagnosis not present

## 2021-03-27 DIAGNOSIS — Z86718 Personal history of other venous thrombosis and embolism: Secondary | ICD-10-CM | POA: Insufficient documentation

## 2021-03-27 DIAGNOSIS — E785 Hyperlipidemia, unspecified: Secondary | ICD-10-CM | POA: Diagnosis not present

## 2021-03-27 DIAGNOSIS — Z79899 Other long term (current) drug therapy: Secondary | ICD-10-CM | POA: Diagnosis not present

## 2021-03-27 DIAGNOSIS — C50911 Malignant neoplasm of unspecified site of right female breast: Secondary | ICD-10-CM | POA: Diagnosis not present

## 2021-03-27 DIAGNOSIS — C50919 Malignant neoplasm of unspecified site of unspecified female breast: Secondary | ICD-10-CM

## 2021-03-27 HISTORY — PX: BREAST LUMPECTOMY WITH RADIOACTIVE SEED LOCALIZATION: SHX6424

## 2021-03-27 HISTORY — DX: Malignant neoplasm of unspecified site of unspecified female breast: C50.919

## 2021-03-27 LAB — GLUCOSE, CAPILLARY
Glucose-Capillary: 113 mg/dL — ABNORMAL HIGH (ref 70–99)
Glucose-Capillary: 113 mg/dL — ABNORMAL HIGH (ref 70–99)

## 2021-03-27 SURGERY — BREAST LUMPECTOMY WITH RADIOACTIVE SEED LOCALIZATION
Anesthesia: General | Site: Breast | Laterality: Right

## 2021-03-27 MED ORDER — CELECOXIB 200 MG PO CAPS
200.0000 mg | ORAL_CAPSULE | Freq: Once | ORAL | Status: AC
  Administered 2021-03-27: 200 mg via ORAL
  Filled 2021-03-27: qty 1

## 2021-03-27 MED ORDER — LACTATED RINGERS IV SOLN: INTRAVENOUS | Status: DC

## 2021-03-27 MED ORDER — LIDOCAINE 2% (20 MG/ML) 5 ML SYRINGE
INTRAMUSCULAR | Status: DC | PRN
Start: 2021-03-27 — End: 2021-03-27
  Administered 2021-03-27: 100 mg via INTRAVENOUS

## 2021-03-27 MED ORDER — ONDANSETRON HCL 4 MG/2ML IJ SOLN
INTRAMUSCULAR | Status: DC | PRN
  Administered 2021-03-27: 4 mg via INTRAVENOUS

## 2021-03-27 MED ORDER — ACETAMINOPHEN 500 MG PO TABS
1000.0000 mg | ORAL_TABLET | Freq: Once | ORAL | Status: AC
  Administered 2021-03-27: 1000 mg via ORAL
  Filled 2021-03-27: qty 2

## 2021-03-27 MED ORDER — PROMETHAZINE HCL 25 MG/ML IJ SOLN: 6.2500 mg | INTRAMUSCULAR | Status: DC | PRN

## 2021-03-27 MED ORDER — DEXMEDETOMIDINE (PRECEDEX) IN NS 20 MCG/5ML (4 MCG/ML) IV SYRINGE
PREFILLED_SYRINGE | INTRAVENOUS | Status: AC
  Filled 2021-03-27: qty 5

## 2021-03-27 MED ORDER — TRAMADOL HCL 50 MG PO TABS: 50.0000 mg | ORAL_TABLET | Freq: Four times a day (QID) | ORAL | 0 refills | Status: DC | PRN

## 2021-03-27 MED ORDER — ACETAMINOPHEN 500 MG PO TABS: 1000.0000 mg | ORAL_TABLET | ORAL | Status: DC

## 2021-03-27 MED ORDER — OXYCODONE HCL 5 MG PO TABS
ORAL_TABLET | ORAL | Status: AC
  Filled 2021-03-27: qty 1

## 2021-03-27 MED ORDER — FENTANYL CITRATE (PF) 100 MCG/2ML IJ SOLN: 25.0000 ug | INTRAMUSCULAR | Status: DC | PRN

## 2021-03-27 MED ORDER — CEFAZOLIN SODIUM-DEXTROSE 2-4 GM/100ML-% IV SOLN
2.0000 g | INTRAVENOUS | Status: AC
  Administered 2021-03-27: 2 g via INTRAVENOUS
  Filled 2021-03-27: qty 100

## 2021-03-27 MED ORDER — 0.9 % SODIUM CHLORIDE (POUR BTL) OPTIME
TOPICAL | Status: DC | PRN
Start: 2021-03-27 — End: 2021-03-27
  Administered 2021-03-27: 1000 mL

## 2021-03-27 MED ORDER — SCOPOLAMINE 1 MG/3DAYS TD PT72
1.0000 | MEDICATED_PATCH | TRANSDERMAL | Status: DC
  Administered 2021-03-27: 1.5 mg via TRANSDERMAL
  Filled 2021-03-27: qty 1

## 2021-03-27 MED ORDER — PROPOFOL 10 MG/ML IV BOLUS
INTRAVENOUS | Status: AC
  Filled 2021-03-27: qty 20

## 2021-03-27 MED ORDER — BUPIVACAINE-EPINEPHRINE (PF) 0.25% -1:200000 IJ SOLN
INTRAMUSCULAR | Status: AC
  Filled 2021-03-27: qty 30

## 2021-03-27 MED ORDER — PROPOFOL 10 MG/ML IV BOLUS
INTRAVENOUS | Status: DC | PRN
  Administered 2021-03-27: 160 mg via INTRAVENOUS

## 2021-03-27 MED ORDER — ENSURE PRE-SURGERY PO LIQD: 296.0000 mL | Freq: Once | ORAL | Status: DC

## 2021-03-27 MED ORDER — FENTANYL CITRATE (PF) 250 MCG/5ML IJ SOLN
INTRAMUSCULAR | Status: AC
  Filled 2021-03-27: qty 5

## 2021-03-27 MED ORDER — DEXAMETHASONE SODIUM PHOSPHATE 10 MG/ML IJ SOLN
INTRAMUSCULAR | Status: DC | PRN
  Administered 2021-03-27: 5 mg via INTRAVENOUS

## 2021-03-27 MED ORDER — EPHEDRINE SULFATE-NACL 50-0.9 MG/10ML-% IV SOSY
PREFILLED_SYRINGE | INTRAVENOUS | Status: DC | PRN
  Administered 2021-03-27 (×3): 5 mg via INTRAVENOUS
  Administered 2021-03-27: 10 mg via INTRAVENOUS

## 2021-03-27 MED ORDER — ORAL CARE MOUTH RINSE: 15.0000 mL | Freq: Once | OROMUCOSAL | Status: AC

## 2021-03-27 MED ORDER — BUPIVACAINE-EPINEPHRINE 0.25% -1:200000 IJ SOLN
INTRAMUSCULAR | Status: DC | PRN
  Administered 2021-03-27: 16 mL

## 2021-03-27 MED ORDER — AMISULPRIDE (ANTIEMETIC) 5 MG/2ML IV SOLN
INTRAVENOUS | Status: AC
  Filled 2021-03-27: qty 4

## 2021-03-27 MED ORDER — OXYCODONE HCL 5 MG PO TABS
5.0000 mg | ORAL_TABLET | Freq: Once | ORAL | Status: AC
  Administered 2021-03-27: 5 mg via ORAL

## 2021-03-27 MED ORDER — FENTANYL CITRATE (PF) 250 MCG/5ML IJ SOLN
INTRAMUSCULAR | Status: DC | PRN
  Administered 2021-03-27: 25 ug via INTRAVENOUS
  Administered 2021-03-27: 50 ug via INTRAVENOUS
  Administered 2021-03-27: 100 ug via INTRAVENOUS
  Administered 2021-03-27: 25 ug via INTRAVENOUS

## 2021-03-27 MED ORDER — CHLORHEXIDINE GLUCONATE CLOTH 2 % EX PADS: 6.0000 | MEDICATED_PAD | Freq: Once | CUTANEOUS | Status: DC

## 2021-03-27 MED ORDER — MIDAZOLAM HCL 2 MG/2ML IJ SOLN
INTRAMUSCULAR | Status: AC
  Filled 2021-03-27: qty 2

## 2021-03-27 MED ORDER — AMISULPRIDE (ANTIEMETIC) 5 MG/2ML IV SOLN
10.0000 mg | Freq: Once | INTRAVENOUS | Status: AC | PRN
  Administered 2021-03-27: 10 mg via INTRAVENOUS

## 2021-03-27 MED ORDER — CHLORHEXIDINE GLUCONATE 0.12 % MT SOLN
15.0000 mL | Freq: Once | OROMUCOSAL | Status: AC
  Administered 2021-03-27: 15 mL via OROMUCOSAL
  Filled 2021-03-27: qty 15

## 2021-03-27 MED ORDER — MIDAZOLAM HCL 2 MG/2ML IJ SOLN
INTRAMUSCULAR | Status: DC | PRN
Start: 2021-03-27 — End: 2021-03-27
  Administered 2021-03-27: 2 mg via INTRAVENOUS

## 2021-03-27 SURGICAL SUPPLY — 36 items
ADH SKN CLS APL DERMABOND .7 (GAUZE/BANDAGES/DRESSINGS) ×1
APL PRP STRL LF DISP 70% ISPRP (MISCELLANEOUS) ×1
APPLIER CLIP 9.375 MED OPEN (MISCELLANEOUS) ×2
APR CLP MED 9.3 20 MLT OPN (MISCELLANEOUS) ×1
BAG COUNTER SPONGE SURGICOUNT (BAG) ×3 IMPLANT
BAG SPNG CNTER NS LX DISP (BAG) ×1
BINDER BREAST LRG (GAUZE/BANDAGES/DRESSINGS) IMPLANT
BINDER BREAST XLRG (GAUZE/BANDAGES/DRESSINGS) ×1 IMPLANT
CANISTER SUCT 3000ML PPV (MISCELLANEOUS) ×3 IMPLANT
CHLORAPREP W/TINT 26 (MISCELLANEOUS) ×3 IMPLANT
CLIP APPLIE 9.375 MED OPEN (MISCELLANEOUS) ×2 IMPLANT
COVER PROBE W GEL 5X96 (DRAPES) ×3 IMPLANT
COVER SURGICAL LIGHT HANDLE (MISCELLANEOUS) ×3 IMPLANT
DERMABOND ADVANCED (GAUZE/BANDAGES/DRESSINGS) ×1
DERMABOND ADVANCED .7 DNX12 (GAUZE/BANDAGES/DRESSINGS) ×2 IMPLANT
DEVICE DUBIN SPECIMEN MAMMOGRA (MISCELLANEOUS) ×3 IMPLANT
DRAPE CHEST BREAST 15X10 FENES (DRAPES) ×3 IMPLANT
ELECT CAUTERY BLADE 6.4 (BLADE) ×3 IMPLANT
ELECT REM PT RETURN 9FT ADLT (ELECTROSURGICAL) ×2
ELECTRODE REM PT RTRN 9FT ADLT (ELECTROSURGICAL) ×2 IMPLANT
GLOVE SURG SIGNA 7.5 PF LTX (GLOVE) ×3 IMPLANT
GOWN STRL REUS W/ TWL LRG LVL3 (GOWN DISPOSABLE) ×2 IMPLANT
GOWN STRL REUS W/ TWL XL LVL3 (GOWN DISPOSABLE) ×2 IMPLANT
GOWN STRL REUS W/TWL LRG LVL3 (GOWN DISPOSABLE) ×2
GOWN STRL REUS W/TWL XL LVL3 (GOWN DISPOSABLE) ×2
KIT BASIN OR (CUSTOM PROCEDURE TRAY) ×3 IMPLANT
KIT MARKER MARGIN INK (KITS) ×3 IMPLANT
NDL HYPO 25GX1X1/2 BEV (NEEDLE) ×2 IMPLANT
NEEDLE HYPO 25GX1X1/2 BEV (NEEDLE) ×2 IMPLANT
NS IRRIG 1000ML POUR BTL (IV SOLUTION) IMPLANT
PACK GENERAL/GYN (CUSTOM PROCEDURE TRAY) ×3 IMPLANT
SUT MNCRL AB 4-0 PS2 18 (SUTURE) ×3 IMPLANT
SUT VIC AB 3-0 SH 18 (SUTURE) ×3 IMPLANT
SYR CONTROL 10ML LL (SYRINGE) ×3 IMPLANT
TOWEL GREEN STERILE (TOWEL DISPOSABLE) ×3 IMPLANT
TOWEL GREEN STERILE FF (TOWEL DISPOSABLE) ×3 IMPLANT

## 2021-03-27 NOTE — Anesthesia Procedure Notes (Signed)
Procedure Name: LMA Insertion Date/Time: 03/27/2021 8:30 AM Performed by: Janace Litten, CRNA Pre-anesthesia Checklist: Patient identified, Emergency Drugs available, Suction available and Patient being monitored Patient Re-evaluated:Patient Re-evaluated prior to induction Oxygen Delivery Method: Circle System Utilized Preoxygenation: Pre-oxygenation with 100% oxygen Induction Type: IV induction Ventilation: Mask ventilation without difficulty LMA: LMA inserted LMA Size: 3.0 Number of attempts: 1 Placement Confirmation: positive ETCO2 Tube secured with: Tape Dental Injury: Teeth and Oropharynx as per pre-operative assessment

## 2021-03-27 NOTE — Anesthesia Postprocedure Evaluation (Signed)
Anesthesia Post Note  Patient: Natalie Rice  Procedure(s) Performed: RIGHT BREAST LUMPECTOMY WITH RADIOACTIVE SEED LOCALIZATION (Right: Breast)     Patient location during evaluation: PACU Anesthesia Type: General Level of consciousness: sedated Pain management: pain level controlled Vital Signs Assessment: post-procedure vital signs reviewed and stable Respiratory status: spontaneous breathing and respiratory function stable Cardiovascular status: stable Postop Assessment: no apparent nausea or vomiting Anesthetic complications: no   No notable events documented.  Last Vitals:  Vitals:   03/27/21 1000 03/27/21 1005  BP:  133/72  Pulse: 70 77  Resp: 14 16  Temp:    SpO2: 96% 97%    Last Pain:  Vitals:   03/27/21 0950  TempSrc:   PainSc: [place]

## 2021-03-27 NOTE — Interval H&P Note (Signed)
History and Physical Interval Note:no change in H and P  03/27/2021 8:00 AM  Natalie Rice  has presented today for surgery, with the diagnosis of RIGHT BREAST DCIS.  The various methods of treatment have been discussed with the patient and family. After consideration of risks, benefits and other options for treatment, the patient has consented to  Procedure(s): RIGHT BREAST LUMPECTOMY WITH RADIOACTIVE SEED LOCALIZATION (Right) as a surgical intervention.  The patient's history has been reviewed, patient examined, no change in status, stable for surgery.  I have reviewed the patient's chart and labs.  Questions were answered to the patient's satisfaction.     Coralie Keens

## 2021-03-27 NOTE — Transfer of Care (Signed)
Immediate Anesthesia Transfer of Care Note  Patient: Natalie Rice  Procedure(s) Performed: RIGHT BREAST LUMPECTOMY WITH RADIOACTIVE SEED LOCALIZATION (Right: Breast)  Patient Location: PACU  Anesthesia Type:General  Level of Consciousness: drowsy, patient cooperative and responds to stimulation  Airway & Oxygen Therapy: Patient Spontanous Breathing  Post-op Assessment: Report given to RN and Post -op Vital signs reviewed and stable  Post vital signs: Reviewed and stable  Last Vitals:  Vitals Value Taken Time  BP 113/69 03/27/21 0920  Temp    Pulse 85 03/27/21 0921  Resp 17 03/27/21 0921  SpO2 97 % 03/27/21 0921  Vitals shown include unvalidated device data.  Last Pain:  Vitals:   03/27/21 0741  TempSrc:   PainSc: 0-No pain         Complications: No notable events documented.

## 2021-03-27 NOTE — Discharge Instructions (Signed)
[place] Office Phone Number [phone]  BREAST BIOPSY/ PARTIAL MASTECTOMY: POST OP INSTRUCTIONS  Always review your discharge instruction sheet given to you by the facility where your surgery was performed.  IF YOU HAVE DISABILITY OR FAMILY LEAVE FORMS, YOU MUST BRING THEM TO THE OFFICE FOR PROCESSING.  DO NOT GIVE THEM TO YOUR DOCTOR.  A prescription for pain medication may be given to you upon discharge.  Take your pain medication as prescribed, if needed.  If narcotic pain medicine is not needed, then you may take acetaminophen (Tylenol) or ibuprofen (Advil) as needed. Take your usually prescribed medications unless otherwise directed If you need a refill on your pain medication, please contact your pharmacy.  They will contact our office to request authorization.  Prescriptions will not be filled after 5pm or on week-ends. You should eat very light the first 24 hours after surgery, such as soup, crackers, pudding, etc.  Resume your normal diet the day after surgery. Most patients will experience some swelling and bruising in the breast.  Ice packs and a good support bra will help.  Swelling and bruising can take several days to resolve.  It is common to experience some constipation if taking pain medication after surgery.  Increasing fluid intake and taking a stool softener will usually help or prevent this problem from occurring.  A mild laxative (Milk of Magnesia or Miralax) should be taken according to package directions if there are no bowel movements after 48 hours. Unless discharge instructions indicate otherwise, you may remove your bandages 24-48 hours after surgery, and you may shower at that time.  You may have steri-strips (small skin tapes) in place directly over the incision.  These strips should be left on the skin for 7-10 days.  If your surgeon used skin glue on the incision, you may shower in 24 hours.  The glue will flake off over the next 2-3 weeks.  Any  sutures or staples will be removed at the office during your follow-up visit. ACTIVITIES:  You may resume regular daily activities (gradually increasing) beginning the next day.  Wearing a good support bra or sports bra minimizes pain and swelling.  You may have sexual intercourse when it is comfortable. You may drive when you no longer are taking prescription pain medication, you can comfortably wear a seatbelt, and you can safely maneuver your car and apply brakes. RETURN TO WORK:  ______________________________________________________________________________________ Dennis Bast should see your doctor in the office for a follow-up appointment approximately two weeks after your surgery.  Your doctors nurse will typically make your follow-up appointment when she calls you with your pathology report.  Expect your pathology report 2-3 business days after your surgery.  You may call to check if you do not hear from [place] after three days. OTHER INSTRUCTIONS: _OK TO REMOVE  THE BINDER AND SHOWER TOMORROW _ICE PACK, TYLENOL, AND IBUPROFEN ALSO FOR PAIN _______NO [facility] [employer] ACTIVITY FOR ONE WEEK ______________________________________________________________________________________ _____________________________________________________________________________________________________________________________________ _____________________________________________________________________________________________________________________________________ _____________________________________________________________________________________________________________________________________  WHEN TO CALL YOUR DOCTOR: Fever over 101.0 Nausea and/or vomiting. Extreme swelling or bruising. Continued bleeding from incision. Increased pain, redness, or drainage from the incision.  The clinic staff is available to answer your questions during regular business hours.  Please dont hesitate to call and ask to speak to one of the nurses  for clinical concerns.  If you have a medical emergency, go to the nearest emergency room or call 911.  A surgeon from [facility] is always on call at the hospital.  For  further questions, please visit [email or web address]   °

## 2021-03-27 NOTE — Op Note (Signed)
RIGHT BREAST LUMPECTOMY WITH RADIOACTIVE SEED LOCALIZATION  Procedure Note  Natalie Rice 03/27/2021   Pre-op Diagnosis: RIGHT BREAST DCIS     Post-op Diagnosis: same  Procedure(s): RIGHT BREAST LUMPECTOMY WITH RADIOACTIVE SEED LOCALIZATION  Surgeon(s): Coralie Keens, MD  Anesthesia: General  Staff:  Circulator: Candi Leash, RN; Rometta Emery, RN Relief Scrub: Beola Cord, RN Scrub Person: Martin Majestic Circulator Assistant: Clovis Fredrickson, RN  Estimated Blood Loss: Minimal               Specimens:sent to path  Indications: This is a 66 year old female was found to have abnormal calcifications in the lower outer quadrant of the right breast on screening mammography.  A biopsy showed ductal carcinoma in situ.  It was ER/PR positive.  The decision was made to proceed with a radioactive seed guided right breast lumpectomy  Procedure: The patient was brought to operating identifies correct patient.  She is placed upon the operating table general anesthesia was induced.  Her right breast was then prepped and draped in usual sterile fashion.  With the neoprobe, the seed was located approximately 6 to 8 cm from the nipple in the lower outer quadrant.  I anesthetized skin around the lateral edge of the areola with Marcaine and then made a circumareolar incision with a scalpel.  I then dissected down to the breast tissue with electrocautery.  I then dissected laterally toward the radioactive seed with aid of the neoprobe.  I was able to dissect just past the seed and then dissected down to the chest wall with the cautery.  I then stayed widely around the seed working back medially and then underneath the lumpectomy specimen.  I then completed a lumpectomy removing the radioactive seed and surrounding breast tissue.  All margins were marked with pain.  An x-ray was performed on the specimen showing the radioactive seed and previous tissue clip as well as  calcifications.  This was sent to pathology for evaluation.  It appeared to be close to the anterior margin so I took more anterior margin and sent this separately with the cautery.  Hemostasis was achieved with the cautery.  I anesthetized incision further with Marcaine.  I placed surgical clips around the periphery of the lumpectomy cavity.  I then closed the subcutaneous tissue with interrupted 3-0 Vicryl sutures and closed the skin with a running 4-0 Monocryl.  Dermabond and a breast binder were then applied.  The patient tolerated the procedure well.  All the counts were correct at the end of the procedure.  The patient was then extubated in the operating room and taken in stable condition to the recovery room.          Coralie Keens   Date: 03/27/2021  Time: 9:18 AM

## 2021-03-28 ENCOUNTER — Encounter (HOSPITAL_COMMUNITY): Payer: Self-pay | Admitting: Surgery

## 2021-03-28 LAB — SURGICAL PATHOLOGY

## 2021-04-03 ENCOUNTER — Encounter: Payer: Self-pay | Admitting: *Deleted

## 2021-04-16 NOTE — Progress Notes (Signed)
Location of Breast Cancer: right DCIS  Histology per Pathology Report:  A. BREAST, RIGHT, LUMPECTOMY:  -  Ductal carcinoma in situ (DCIS), grade III (high), with central  ("comedo") necrosis and calcifications, 8 mm in greatest dimension.  -  Resected edges negative for DCIS, distance from DCIS to closest edge  7 mm, anterior.  -  Negative for invasive carcinoma.  -  Seed implants with foreign body reaction and inflammation.   B. BREAST, RIGHT NEW ANTERIOR MARGIN, EXCISION:  -  Benign breast tissue with mild cystic apocrine metaplasia.  -  Negative for DCIS and invasive carcinoma.   Breast, right, needle core biopsy, 9 o'clock, 5.5 cmfn - DUCTAL CARCINOMA IN SITU WITH NECROSIS AND CALCIFICATIONS  Receptor Status:  Estrogen Receptor: 90%, POSITIVE, MODERATE STAINING INTENSITY Progesterone Receptor: 40%, POSITIVE, STRONG-MODERATE STAINING INTENSITY  Did patient present with symptoms (if so, please note symptoms) or was this found on screening mammography?: routine screening mammography on 01/23/21 showing indeterminate calcifications in the right breast.  Past/Anticipated interventions by surgeon, if any:  Procedure(s): RIGHT BREAST LUMPECTOMY WITH RADIOACTIVE SEED LOCALIZATION Surgeon(s): Coralie Keens, MD  Past/Anticipated interventions by medical oncology, if any: Dr Burr Medico PLAN:  -I'll see her the last week of radiation, to review antiestrogen therapy again.  She is leaning towards radiation alone at this point.  Lymphedema issues, if any:  no    Pain issues, if any:  no   SAFETY ISSUES: Prior radiation? no Pacemaker/ICD? no Possible current pregnancy?no, postmenopausal Is the patient on methotrexate? no  Current Complaints / other details:  occasional bilateral axilla itching    Vitals:   04/23/21 0944  BP: (!) 161/87  Pulse: 80  Resp: 20  Temp: 97.6 F (36.4 C)  SpO2: 99%  Weight: 179 lb (81.2 kg)  Height: 4\' 11"  (1.499 m)

## 2021-04-22 NOTE — Progress Notes (Signed)
Radiation Oncology         [phone] ________________________________  Name: Donah Driver L. Ricard MRN: [id number]  Date: 04/23/2021  DOB: 1956/02/02  Re-Evaluation Note  CC: Jinny Sanders, MD  Truitt Merle, MD    ICD-10-CM   1. Ductal carcinoma in situ (DCIS) of right breast  D05.11       Diagnosis:  S/p lumpectomy: Stage 0 (cTis (DCIS), cN0, cM0) Right Breast, High-grade DCIS, ER+ / PR+ / Her2 not assessed  Narrative:  The patient returns today to discuss radiation treatment options. She was seen in the multidisciplinary breast clinic on 03/12/21.   She opted to proceed with right breast lumpectomy without nodal biopsies on 03/27/21 under the care of Dr. [name]. Pathology from the procedure revealed: high grade ductal carcinoma in -situ with necrosis and calcifications measuring 8 mm in the greatest dimension. All resected margins negative for invasive or in-situ carcinoma, with DCIS coming closest to the anterior margin by 7 mm (right new anterior margin excision performed revealed benign breast tissue with mild cystic apocrine metaplasia). Prognostic indicators significant for: ER status 90% positive with moderate staining intensity; PR status 40% positive with strong-moderate staining intensity; Her2 not assessed.   During a post-op follow up with Dr. [name] on 04/11/21, the patient was noted to be doing well, and denied any complaints/issues other than mild discomfort.  On review of systems, the patient reports doing well after surgery. She denies pain along the right breast arm or shoulder region and any other symptoms.    Allergies:  has No Known Allergies.  Meds: Current Outpatient Medications  Medication Sig Dispense Refill   Apoaequorin (PREVAGEN PO) Take 1 tablet by mouth in the morning.     atorvastatin (LIPITOR) 10 MG tablet Take 1 tablet (10 mg total) by mouth every evening. 90 tablet 0   Cholecalciferol (VITAMIN D-3) 125 MCG (5000 UT) TABS Take 5,000 Units by mouth  in the morning.     Coenzyme Q10 (COQ10) 200 MG CAPS Take 200 mg by mouth in the morning.     Loratadine 10 MG CAPS Take 10 mg by mouth daily as needed (seasonal allergies.).     losartan-hydrochlorothiazide (HYZAAR) 50-12.5 MG tablet TAKE 1 TABLET DAILY 90 tablet 0   Magnesium 250 MG TABS Take 250 mg by mouth in the morning.     Omega-3 Fatty Acids (OMEGA 3 PO) Take 1 capsule by mouth daily.     Potassium 99 MG TABS Take 99 mg by mouth in the morning.     clotrimazole-betamethasone (LOTRISONE) cream Apply 1 application topically 2 (two) times daily. (Patient not taking: Reported on 04/23/2021) 30 g 1   traMADol (ULTRAM) 50 MG tablet Take 1 tablet (50 mg total) by mouth every 6 (six) hours as needed. (Patient not taking: Reported on 04/23/2021) 25 tablet 0   No current facility-administered medications for this encounter.    Physical Findings: The patient is in no acute distress. Patient is alert and oriented.  height is _0  (1.499 m) and weight is 179 lb (81.2 kg). Her temperature is 97.6 F (36.4 C). Her blood pressure is 161/87 (abnormal) and her pulse is 80. Her respiration is 20 and oxygen saturation is 99%.  Lungs are clear to auscultation bilaterally. Heart has regular rate and rhythm. No palpable cervical, supraclavicular, or axillary adenopathy. Abdomen soft, non-tender, normal bowel sounds. Right breast: no palpable mass, nipple discharge or bleeding.  Well-healed small periareolar scar.  No signs of drainage or infection  Lab Findings: Lab Results  Component Value Date   WBC 7.4 03/12/2021   HGB 15.1 (H) 03/12/2021   HCT 44.9 03/12/2021   MCV 83.9 03/12/2021   PLT 331 03/12/2021    Radiographic Findings: No results found.  Impression:  S/p lumpectomy: Stage 0 (cTis (DCIS), cN0, cM0) Right Breast, High-grade DCIS, ER+ / PR+ / Her2 not assessed  I discussed with the patient that given her high-grade noninvasive breast cancer I would recommend radiation therapy as part of  her adjuvant treatment.  I discussed the overall course of treatment side effects and potential toxicities of radiation therapy with patient and her husband.  She is somewhat hesitant to proceed with radiation therapy but does understand the importance of this issue.  She is actually considering not proceeding with adjuvant hormonal therapy and in that situation I would strongly recommend radiation therapy.  Plan:  Patient is scheduled for CT simulation later today.  Treatments began approximately a week.  Anticipate 4 weeks of radiation therapy  -----------------------------------  Blair Promise, PhD, MD  This document serves as a record of services personally performed by Gery Pray, MD. It was created on his behalf by Roney Mans, a trained medical scribe. The creation of this record is based on the scribe's personal observations and the provider's statements to them. This document has been checked and approved by the attending provider.

## 2021-04-23 ENCOUNTER — Encounter: Payer: Self-pay | Admitting: Radiation Oncology

## 2021-04-23 ENCOUNTER — Ambulatory Visit
Admission: RE | Admit: 2021-04-23 | Discharge: 2021-04-23 | Disposition: A | Discharge status: Home / Self Care | Payer: BC Managed Care – PPO | Source: Ambulatory Visit | Attending: Radiation Oncology | Admitting: Radiation Oncology

## 2021-04-23 ENCOUNTER — Other Ambulatory Visit: Payer: Self-pay

## 2021-04-23 VITALS — BP 161/87 | HR 80 | Temp 97.6°F | Resp 20 | Ht 59.0 in | Wt 179.0 lb

## 2021-04-23 DIAGNOSIS — D0511 Intraductal carcinoma in situ of right breast: Secondary | ICD-10-CM

## 2021-04-23 DIAGNOSIS — Z170 Estrogen receptor positive status [ER+]: Secondary | ICD-10-CM | POA: Diagnosis not present

## 2021-04-23 DIAGNOSIS — Z510 Encounter for antineoplastic radiation therapy: Secondary | ICD-10-CM | POA: Diagnosis not present

## 2021-04-23 DIAGNOSIS — Z79899 Other long term (current) drug therapy: Secondary | ICD-10-CM | POA: Insufficient documentation

## 2021-04-23 NOTE — Progress Notes (Signed)
See MD note for nursing evaluation. °

## 2021-04-25 DIAGNOSIS — G4733 Obstructive sleep apnea (adult) (pediatric): Secondary | ICD-10-CM | POA: Diagnosis not present

## 2021-04-29 ENCOUNTER — Encounter: Payer: Self-pay | Admitting: *Deleted

## 2021-04-29 DIAGNOSIS — D0511 Intraductal carcinoma in situ of right breast: Secondary | ICD-10-CM | POA: Diagnosis not present

## 2021-04-29 DIAGNOSIS — Z510 Encounter for antineoplastic radiation therapy: Secondary | ICD-10-CM | POA: Diagnosis not present

## 2021-05-01 ENCOUNTER — Other Ambulatory Visit: Payer: Self-pay

## 2021-05-01 ENCOUNTER — Ambulatory Visit
Admission: RE | Admit: 2021-05-01 | Discharge: 2021-05-01 | Disposition: A | Discharge status: Home / Self Care | Payer: BC Managed Care – PPO | Source: Ambulatory Visit | Attending: Radiation Oncology | Admitting: Radiation Oncology

## 2021-05-01 DIAGNOSIS — D0511 Intraductal carcinoma in situ of right breast: Secondary | ICD-10-CM | POA: Diagnosis not present

## 2021-05-01 DIAGNOSIS — Z510 Encounter for antineoplastic radiation therapy: Secondary | ICD-10-CM | POA: Diagnosis not present

## 2021-05-02 ENCOUNTER — Ambulatory Visit
Admission: RE | Admit: 2021-05-02 | Discharge: 2021-05-02 | Disposition: A | Discharge status: Home / Self Care | Payer: BC Managed Care – PPO | Source: Ambulatory Visit | Attending: Radiation Oncology | Admitting: Radiation Oncology

## 2021-05-02 DIAGNOSIS — D0511 Intraductal carcinoma in situ of right breast: Secondary | ICD-10-CM | POA: Diagnosis not present

## 2021-05-02 DIAGNOSIS — Z510 Encounter for antineoplastic radiation therapy: Secondary | ICD-10-CM | POA: Diagnosis not present

## 2021-05-05 ENCOUNTER — Other Ambulatory Visit: Payer: Self-pay

## 2021-05-05 ENCOUNTER — Ambulatory Visit
Admission: RE | Admit: 2021-05-05 | Discharge: 2021-05-05 | Disposition: A | Discharge status: Home / Self Care | Payer: BC Managed Care – PPO | Source: Ambulatory Visit | Attending: Radiation Oncology | Admitting: Radiation Oncology

## 2021-05-05 DIAGNOSIS — Z510 Encounter for antineoplastic radiation therapy: Secondary | ICD-10-CM | POA: Diagnosis not present

## 2021-05-05 DIAGNOSIS — D0511 Intraductal carcinoma in situ of right breast: Secondary | ICD-10-CM | POA: Diagnosis not present

## 2021-05-06 ENCOUNTER — Ambulatory Visit
Admission: RE | Admit: 2021-05-06 | Discharge: 2021-05-06 | Disposition: A | Discharge status: Home / Self Care | Payer: BC Managed Care – PPO | Source: Ambulatory Visit | Attending: Radiation Oncology | Admitting: Radiation Oncology

## 2021-05-06 DIAGNOSIS — D0511 Intraductal carcinoma in situ of right breast: Secondary | ICD-10-CM

## 2021-05-06 DIAGNOSIS — Z510 Encounter for antineoplastic radiation therapy: Secondary | ICD-10-CM | POA: Diagnosis not present

## 2021-05-06 MED ORDER — RADIAPLEXRX EX GEL: Freq: Once | CUTANEOUS | Status: AC

## 2021-05-07 ENCOUNTER — Other Ambulatory Visit: Payer: Self-pay

## 2021-05-07 ENCOUNTER — Ambulatory Visit
Admission: RE | Admit: 2021-05-07 | Discharge: 2021-05-07 | Disposition: A | Discharge status: Home / Self Care | Payer: BC Managed Care – PPO | Source: Ambulatory Visit | Attending: Radiation Oncology | Admitting: Radiation Oncology

## 2021-05-07 DIAGNOSIS — Z510 Encounter for antineoplastic radiation therapy: Secondary | ICD-10-CM | POA: Diagnosis not present

## 2021-05-07 DIAGNOSIS — D0511 Intraductal carcinoma in situ of right breast: Secondary | ICD-10-CM | POA: Diagnosis not present

## 2021-05-08 ENCOUNTER — Ambulatory Visit
Admission: RE | Admit: 2021-05-08 | Discharge: 2021-05-08 | Disposition: A | Discharge status: Home / Self Care | Payer: BC Managed Care – PPO | Source: Ambulatory Visit | Attending: Radiation Oncology | Admitting: Radiation Oncology

## 2021-05-08 DIAGNOSIS — Z510 Encounter for antineoplastic radiation therapy: Secondary | ICD-10-CM | POA: Diagnosis not present

## 2021-05-08 DIAGNOSIS — D0511 Intraductal carcinoma in situ of right breast: Secondary | ICD-10-CM | POA: Diagnosis not present

## 2021-05-09 ENCOUNTER — Ambulatory Visit
Admission: RE | Admit: 2021-05-09 | Discharge: 2021-05-09 | Disposition: A | Discharge status: Home / Self Care | Payer: BC Managed Care – PPO | Source: Ambulatory Visit | Attending: Radiation Oncology | Admitting: Radiation Oncology

## 2021-05-09 ENCOUNTER — Other Ambulatory Visit: Payer: Self-pay

## 2021-05-09 DIAGNOSIS — D0511 Intraductal carcinoma in situ of right breast: Secondary | ICD-10-CM | POA: Diagnosis not present

## 2021-05-09 DIAGNOSIS — Z510 Encounter for antineoplastic radiation therapy: Secondary | ICD-10-CM | POA: Diagnosis not present

## 2021-05-12 ENCOUNTER — Other Ambulatory Visit: Payer: Self-pay

## 2021-05-12 ENCOUNTER — Ambulatory Visit
Admission: RE | Admit: 2021-05-12 | Discharge: 2021-05-12 | Disposition: A | Discharge status: Home / Self Care | Payer: BC Managed Care – PPO | Source: Ambulatory Visit | Attending: Radiation Oncology | Admitting: Radiation Oncology

## 2021-05-12 DIAGNOSIS — D0511 Intraductal carcinoma in situ of right breast: Secondary | ICD-10-CM | POA: Diagnosis not present

## 2021-05-12 DIAGNOSIS — Z510 Encounter for antineoplastic radiation therapy: Secondary | ICD-10-CM | POA: Diagnosis not present

## 2021-05-13 ENCOUNTER — Ambulatory Visit
Admission: RE | Admit: 2021-05-13 | Discharge: 2021-05-13 | Disposition: A | Discharge status: Home / Self Care | Payer: BC Managed Care – PPO | Source: Ambulatory Visit | Attending: Radiation Oncology | Admitting: Radiation Oncology

## 2021-05-13 DIAGNOSIS — D0511 Intraductal carcinoma in situ of right breast: Secondary | ICD-10-CM | POA: Diagnosis not present

## 2021-05-13 DIAGNOSIS — Z510 Encounter for antineoplastic radiation therapy: Secondary | ICD-10-CM | POA: Diagnosis not present

## 2021-05-14 ENCOUNTER — Other Ambulatory Visit: Payer: Self-pay

## 2021-05-14 ENCOUNTER — Ambulatory Visit
Admission: RE | Admit: 2021-05-14 | Discharge: 2021-05-14 | Disposition: A | Discharge status: Home / Self Care | Payer: BC Managed Care – PPO | Source: Ambulatory Visit | Attending: Radiation Oncology | Admitting: Radiation Oncology

## 2021-05-14 DIAGNOSIS — D0511 Intraductal carcinoma in situ of right breast: Secondary | ICD-10-CM | POA: Insufficient documentation

## 2021-05-15 ENCOUNTER — Ambulatory Visit
Admission: RE | Admit: 2021-05-15 | Discharge: 2021-05-15 | Disposition: A | Discharge status: Home / Self Care | Payer: BC Managed Care – PPO | Source: Ambulatory Visit | Attending: Radiation Oncology | Admitting: Radiation Oncology

## 2021-05-15 DIAGNOSIS — D0511 Intraductal carcinoma in situ of right breast: Secondary | ICD-10-CM | POA: Diagnosis not present

## 2021-05-16 ENCOUNTER — Other Ambulatory Visit: Payer: Self-pay

## 2021-05-16 ENCOUNTER — Ambulatory Visit
Admission: RE | Admit: 2021-05-16 | Discharge: 2021-05-16 | Disposition: A | Discharge status: Home / Self Care | Payer: BC Managed Care – PPO | Source: Ambulatory Visit | Attending: Radiation Oncology | Admitting: Radiation Oncology

## 2021-05-16 DIAGNOSIS — D0511 Intraductal carcinoma in situ of right breast: Secondary | ICD-10-CM | POA: Diagnosis not present

## 2021-05-19 ENCOUNTER — Other Ambulatory Visit: Payer: Self-pay

## 2021-05-19 ENCOUNTER — Ambulatory Visit
Admission: RE | Admit: 2021-05-19 | Discharge: 2021-05-19 | Disposition: A | Discharge status: Home / Self Care | Payer: BC Managed Care – PPO | Source: Ambulatory Visit | Attending: Radiation Oncology | Admitting: Radiation Oncology

## 2021-05-19 DIAGNOSIS — D0511 Intraductal carcinoma in situ of right breast: Secondary | ICD-10-CM | POA: Diagnosis not present

## 2021-05-20 ENCOUNTER — Ambulatory Visit: Payer: BC Managed Care – PPO | Admitting: Radiation Oncology

## 2021-05-20 ENCOUNTER — Ambulatory Visit
Admission: RE | Admit: 2021-05-20 | Discharge: 2021-05-20 | Disposition: A | Discharge status: Home / Self Care | Payer: BC Managed Care – PPO | Source: Ambulatory Visit | Attending: Radiation Oncology | Admitting: Radiation Oncology

## 2021-05-20 DIAGNOSIS — D0511 Intraductal carcinoma in situ of right breast: Secondary | ICD-10-CM | POA: Diagnosis not present

## 2021-05-21 ENCOUNTER — Ambulatory Visit
Admission: RE | Admit: 2021-05-21 | Discharge: 2021-05-21 | Disposition: A | Discharge status: Home / Self Care | Payer: BC Managed Care – PPO | Source: Ambulatory Visit | Attending: Radiation Oncology | Admitting: Radiation Oncology

## 2021-05-21 ENCOUNTER — Other Ambulatory Visit: Payer: Self-pay

## 2021-05-21 ENCOUNTER — Encounter (HOSPITAL_COMMUNITY): Payer: Self-pay

## 2021-05-21 DIAGNOSIS — D0511 Intraductal carcinoma in situ of right breast: Secondary | ICD-10-CM | POA: Diagnosis not present

## 2021-05-22 ENCOUNTER — Ambulatory Visit
Admission: RE | Admit: 2021-05-22 | Discharge: 2021-05-22 | Disposition: A | Discharge status: Home / Self Care | Payer: BC Managed Care – PPO | Source: Ambulatory Visit | Attending: Radiation Oncology | Admitting: Radiation Oncology

## 2021-05-22 DIAGNOSIS — D0511 Intraductal carcinoma in situ of right breast: Secondary | ICD-10-CM | POA: Diagnosis not present

## 2021-05-23 ENCOUNTER — Ambulatory Visit: Payer: BC Managed Care – PPO

## 2021-05-23 ENCOUNTER — Other Ambulatory Visit: Payer: Self-pay

## 2021-05-23 ENCOUNTER — Inpatient Hospital Stay: Payer: BC Managed Care – PPO | Attending: Hematology | Admitting: Hematology

## 2021-05-23 VITALS — BP 138/80 | HR 76 | Temp 98.7°F | Resp 20 | Wt 182.5 lb

## 2021-05-23 DIAGNOSIS — Z170 Estrogen receptor positive status [ER+]: Secondary | ICD-10-CM | POA: Diagnosis not present

## 2021-05-23 DIAGNOSIS — Z780 Asymptomatic menopausal state: Secondary | ICD-10-CM | POA: Insufficient documentation

## 2021-05-23 DIAGNOSIS — D0511 Intraductal carcinoma in situ of right breast: Secondary | ICD-10-CM | POA: Insufficient documentation

## 2021-05-23 NOTE — Progress Notes (Signed)
?[facility]   ?[phone] [phone]   ?Clinic Follow up Note  ? ?Patient Care Team: ?Jinny Sanders, MD as PCP - General (Family Medicine) ?Rockwell Germany, RN as Oncology Nurse Navigator ?Mauro Kaufmann, RN as Oncology Nurse Navigator ?Truitt Merle, MD as Consulting Physician (Medical Oncology) ?Gery Pray, MD as Consulting Physician (Radiation Oncology) ?Coralie Keens, MD as Consulting Physician (General Surgery) ? ?Date of Service:  05/23/2021 ? ?CHIEF COMPLAINT: f/u of right breast DCIS ? ?CURRENT THERAPY:  ?Adjuvant radiation, 2/16-3/16/23 ? ?ASSESSMENT & PLAN:  ?Natalie Rice is a 66 y.o. postmenopausal female with  ? ?1. Right breast DCIS, grade 3, ER+/PR+ ?-found on screening mammogram on 01/23/21. Biopsy on 02/26/21 confirmed high grade DCIS. ?-right lumpectomy on 03/27/21 by Dr. [name] again showed high grade DCIS, 8 mm, no invasive carcinoma, margins negative. ?-she is currently receiving radiation under Dr. Sondra Come, started 05/01/21. She is scheduled to finish on 05/29/21. ?-we again discussed adjuvant antiestrogen therapy. Due to her high grade disease, and ER+, I encouraged her to consider tamoxifen or anastrozole for 3 to 5 years.  We also discussed low-dose regimen. She expressed concern with side effects and will think about it. ?-We also reviewed the breast cancer surveillance, including annual mammogram, and physical exam.  ? ?2. Bone Health ?-she has never had a bone density screening before. I will order baseline for her today. ?  ?  ?PLAN:  ?-DEXA to be done soon ?-survivorship in 3 months ?-lab and f/u in 6 months ?-She will think about antiestrogen therapy, and let us know if she decides to try. ? ? ?No problem-specific Assessment & Plan notes found for this encounter. ? ? ?SUMMARY OF ONCOLOGIC HISTORY: ?Oncology History Overview Note  ? Cancer Staging  ?Ductal carcinoma in situ (DCIS) of right breast ?Staging form: Breast, AJCC 8th Edition ?-  Clinical stage from 03/12/2021: Stage 0 (cTis (DCIS), cN0, cM0, ER+, PR+) - Signed by Truitt Merle, MD on 03/12/2021 ?Stage prefix: Initial diagnosis ?Nuclear grade: G3 ? ? ?  ?Ductal carcinoma in situ (DCIS) of right breast  ?02/12/2021 Mammogram  ? Exam: 3D Mammogram Diagnotic - Right ? ?IMPRESSION: ?The 1.2 cm grouped fine linear calcifications in the right breast are suspicious. ?  ?02/26/2021 Initial Biopsy  ? Diagnosis ?Breast, right, needle core biopsy, 9 o'clock, 5.5 cmfn ?- DUCTAL CARCINOMA IN SITU WITH NECROSIS AND CALCIFICATIONS ?- SEE COMMENT ?Microscopic Comment ?Based on the biopsy, the ductal carcinoma in situ has a comedo pattern, high nuclear grade and measures 0.1 cm in greatest linear extent. ? ?PROGNOSTIC INDICATORS ?Results: ?Estrogen Receptor: 90%, POSITIVE, MODERATE STAINING INTENSITY ?Progesterone Receptor: 40%, POSITIVE, STRONG-MODERATE STAINING INTENSITY ?  ?03/05/2021 Initial Diagnosis  ? Ductal carcinoma in situ (DCIS) of right breast ?  ?03/12/2021 Cancer Staging  ? Staging form: Breast, AJCC 8th Edition ?- Clinical stage from 03/12/2021: Stage 0 (cTis (DCIS), cN0, cM0, ER+, PR+) - Signed by Truitt Merle, MD on 03/12/2021 ?Stage prefix: Initial diagnosis ?Nuclear grade: G3 ? ?  ?03/27/2021 Cancer Staging  ? Staging form: Breast, AJCC 8th Edition ?- Pathologic stage from 03/27/2021: Stage Unknown (pTis (DCIS), pNX, cM0, G3, ER+, PR+, HER2: Not Assessed) - Signed by Truitt Merle, MD on 05/23/2021 ?Stage prefix: Initial diagnosis ?Histologic grading system: 3 grade system ?Residual tumor (R): R0 - None ? ?  ? ? ? ?INTERVAL HISTORY:  ?Ifeoluwa L. Cartelli is here for a follow up of breast cancer. She was last seen by me on 03/12/21  in consultation. She presents to the clinic alone. ?She reports her radiation appointment today was cancelled due to the machines being down. She reports she is tolerating well overall, with some skin tightening and mild fatigue.  ?  ?All other systems were reviewed with the  patient and are negative. ? ?MEDICAL HISTORY:  ?Past Medical History:  ?Diagnosis Date  ? Breast cancer ([place])   ? Colon polyp   ? Diabetes mellitus without complication ([place])   ? Eosinophilia 03/03/2012  ? Hay fever   ? High blood pressure   ? High cholesterol   ? Hyperlipidemia   ? Sleep apnea   ? ? ?SURGICAL HISTORY: ?Past Surgical History:  ?Procedure Laterality Date  ? BREAST LUMPECTOMY WITH RADIOACTIVE SEED LOCALIZATION Right 03/27/2021  ? Procedure: RIGHT BREAST LUMPECTOMY WITH RADIOACTIVE SEED LOCALIZATION;  Surgeon: Coralie Keens, MD;  Location: [facility];  Service: General;  Laterality: Right;  ? TUBAL LIGATION    ? ? ?I have reviewed the social history and family history with the patient and they are unchanged from previous note. ? ?ALLERGIES:  has No Known Allergies. ? ?MEDICATIONS:  ?Current Outpatient Medications  ?Medication Sig Dispense Refill  ? Apoaequorin (PREVAGEN PO) Take 1 tablet by mouth in the morning.    ? atorvastatin (LIPITOR) 10 MG tablet Take 1 tablet (10 mg total) by mouth every evening. 90 tablet 0  ? Cholecalciferol (VITAMIN D-3) 125 MCG (5000 UT) TABS Take 5,000 Units by mouth in the morning.    ? clotrimazole-betamethasone (LOTRISONE) cream Apply 1 application topically 2 (two) times daily. (Patient not taking: Reported on 04/23/2021) 30 g 1  ? Coenzyme Q10 (COQ10) 200 MG CAPS Take 200 mg by mouth in the morning.    ? Loratadine 10 MG CAPS Take 10 mg by mouth daily as needed (seasonal allergies.).    ? losartan-hydrochlorothiazide (HYZAAR) 50-12.5 MG tablet TAKE 1 TABLET DAILY 90 tablet 0  ? Magnesium 250 MG TABS Take 250 mg by mouth in the morning.    ? Omega-3 Fatty Acids (OMEGA 3 PO) Take 1 capsule by mouth daily.    ? Potassium 99 MG TABS Take 99 mg by mouth in the morning.    ? traMADol (ULTRAM) 50 MG tablet Take 1 tablet (50 mg total) by mouth every 6 (six) hours as needed. (Patient not taking: Reported on 04/23/2021) 25 tablet 0  ? ?No current facility-administered medications for  this visit.  ? ? ?PHYSICAL EXAMINATION: ?ECOG PERFORMANCE STATUS: 0 - Asymptomatic ? ?Vitals:  ? 05/23/21 1150  ?BP: 138/80  ?Pulse: 76  ?Resp: 20  ?Temp: 98.7 ?F (37.1 ?C)  ?SpO2: 100%  ? ?Wt Readings from Last 3 Encounters:  ?05/23/21 182 lb 8 oz (82.8 kg)  ?04/23/21 179 lb (81.2 kg)  ?03/27/21 182 lb (82.6 kg)  ?  ? ?GENERAL:alert, no distress and comfortable ?SKIN: skin color normal, no rashes or significant lesions ?EYES: normal, Conjunctiva are pink and non-injected, sclera clear  ?NEURO: alert & oriented x 3 with fluent speech ? ?LABORATORY DATA:  ?I have reviewed the data as listed ?CBC Latest Ref Rng & Units 03/12/2021 09/15/2012 09/10/2012  ?WBC 4.0 - 10.5 K/uL 7.4 9.3 7.6  ?Hemoglobin 12.0 - 15.0 g/dL 15.1(H) 14.0 13.6  ?Hematocrit 36.0 - 46.0 % 44.9 42.6 41.2  ?Platelets 150 - 400 K/uL 331 336.0 278  ? ? ? ?CMP Latest Ref Rng & Units 03/12/2021 05/24/2020 05/02/2019  ?Glucose 70 - 99 mg/dL 111(H) 110(H) 95  ?BUN 8 - 23  mg/dL _0 ?Creatinine 0.44 - 1.00 mg/dL 0.92 0.99 0.91  ?Sodium 135 - 145 mmol/L 139 138 137  ?Potassium 3.5 - 5.1 mmol/L 3.5 4.0 3.8  ?Chloride 98 - 111 mmol/L 103 101 101  ?CO2 22 - 32 mmol/L _1 ?Calcium 8.9 - 10.3 mg/dL 10.1 10.2 10.2  ?Total Protein 6.5 - 8.1 g/dL 7.5 7.4 7.8  ?Total Bilirubin 0.3 - 1.2 mg/dL 0.7 0.5 0.4  ?Alkaline Phos 38 - 126 U/L 65 76 86  ?AST 15 - 41 U/L _2 ?ALT 0 - 44 U/L _3 ? ? ? ? ?RADIOGRAPHIC STUDIES: ?I have personally reviewed the radiological images as listed and agreed with the findings in the report. ?No results found.  ? ? ?No orders of the defined types were placed in this encounter. ? ?All questions were answered. The patient knows to call the clinic with any problems, questions or concerns. No barriers to learning was detected. ?The total time spent in the appointment was 30 minutes. ? ?  ? Truitt Merle, MD ?05/23/2021  ? ?I, Wilburn Mylar, am acting as scribe for Truitt Merle, MD.  ? ?I have reviewed the above documentation for  accuracy and completeness, and I agree with the above. ?  ? ? ?

## 2021-05-24 ENCOUNTER — Encounter: Payer: Self-pay | Admitting: Hematology

## 2021-05-26 ENCOUNTER — Ambulatory Visit: Payer: BC Managed Care – PPO

## 2021-05-26 ENCOUNTER — Other Ambulatory Visit: Payer: Self-pay

## 2021-05-26 ENCOUNTER — Ambulatory Visit
Admission: RE | Admit: 2021-05-26 | Discharge: 2021-05-26 | Disposition: A | Discharge status: Home / Self Care | Payer: BC Managed Care – PPO | Source: Ambulatory Visit | Attending: Radiation Oncology | Admitting: Radiation Oncology

## 2021-05-26 DIAGNOSIS — D0511 Intraductal carcinoma in situ of right breast: Secondary | ICD-10-CM | POA: Diagnosis not present

## 2021-05-27 ENCOUNTER — Encounter: Payer: Self-pay | Admitting: *Deleted

## 2021-05-27 ENCOUNTER — Ambulatory Visit: Payer: BC Managed Care – PPO

## 2021-05-27 ENCOUNTER — Ambulatory Visit
Admission: RE | Admit: 2021-05-27 | Discharge: 2021-05-27 | Disposition: A | Discharge status: Home / Self Care | Payer: BC Managed Care – PPO | Source: Ambulatory Visit | Attending: Radiation Oncology | Admitting: Radiation Oncology

## 2021-05-27 DIAGNOSIS — D0511 Intraductal carcinoma in situ of right breast: Secondary | ICD-10-CM | POA: Diagnosis not present

## 2021-05-27 MED ORDER — RADIAPLEXRX EX GEL: Freq: Once | CUTANEOUS | Status: AC

## 2021-05-28 ENCOUNTER — Other Ambulatory Visit: Payer: Self-pay

## 2021-05-28 ENCOUNTER — Ambulatory Visit: Payer: BC Managed Care – PPO

## 2021-05-28 ENCOUNTER — Ambulatory Visit
Admission: RE | Admit: 2021-05-28 | Discharge: 2021-05-28 | Disposition: A | Discharge status: Home / Self Care | Payer: BC Managed Care – PPO | Source: Ambulatory Visit | Attending: Radiation Oncology | Admitting: Radiation Oncology

## 2021-05-28 DIAGNOSIS — D0511 Intraductal carcinoma in situ of right breast: Secondary | ICD-10-CM | POA: Diagnosis not present

## 2021-05-29 ENCOUNTER — Ambulatory Visit: Payer: BC Managed Care – PPO

## 2021-05-29 ENCOUNTER — Encounter: Payer: Self-pay | Admitting: Radiation Oncology

## 2021-05-29 ENCOUNTER — Ambulatory Visit
Admission: RE | Admit: 2021-05-29 | Discharge: 2021-05-29 | Disposition: A | Discharge status: Home / Self Care | Payer: BC Managed Care – PPO | Source: Ambulatory Visit | Attending: Radiation Oncology | Admitting: Radiation Oncology

## 2021-05-29 DIAGNOSIS — D0511 Intraductal carcinoma in situ of right breast: Secondary | ICD-10-CM | POA: Diagnosis not present

## 2021-06-02 ENCOUNTER — Other Ambulatory Visit: Payer: Self-pay | Admitting: Family Medicine

## 2021-06-02 NOTE — Telephone Encounter (Signed)
Please call patient and schedule CPE.  

## 2021-06-24 ENCOUNTER — Encounter: Payer: Self-pay | Admitting: Radiology

## 2021-06-27 NOTE — Progress Notes (Incomplete)
?  Radiation Oncology         [phone] ?________________________________ ? ?Patient Name: Natalie Rice ?MRN: [id number] ?DOB: 03-19-1955 ?Referring Physician: BEDSOLE AMY (Profile Not Attached) ?Date of Service: 05/29/2021 ?[facility], [place] ? ?                                                      End Of Treatment Note ? ?Diagnoses: D05.11-Intraductal carcinoma in situ of right breast ? ?Cancer Staging: S/p lumpectomy: Stage 0 (cTis (DCIS), cN0, cM0) Right Breast, High-grade DCIS, ER+ / PR+ / Her2 not assessed ? ?Intent: Curative ? ?Radiation Treatment Dates: 05/01/2021 through 05/29/2021 ?Site Technique Total Dose (Gy) Dose per Fx (Gy) Completed Fx Beam Energies  ?Breast, Right: Breast_R 3D 40.05/40.05 2.67 15/15 6XFFF, 10XFFF  ?Breast, Right: Breast_R_Bst 3D 10/10 2 5/5 6X, 10X  ? ?Narrative: The patient tolerated radiation therapy relatively well. During her final weekly treatment check on 05/27/21, the patient reported mild pain to right breast and right axilla, fatigue, itching, hyperpigmentation changes, and right breast tenderness. Physical exam performed on that same date revealed some hyperpigmentation changes and erythema to the right breast area, most significant in the lower axillary region. No skin breakdown was appreciated. For her itching, I recommended she obtain some hydrocortisone cream.  ? ?Plan: The patient will follow-up with radiation oncology in one month . ? ?________________________________________________ ?----------------------------------- ? ?Blair Promise, PhD, MD ? ?This document serves as a record of services personally performed by Gery Pray, MD. It was created on his behalf by Roney Mans, a trained medical scribe. The creation of this record is based on the scribe's personal observations and the provider's statements to them. This document has been checked and approved by the attending provider. ? ?

## 2021-06-29 NOTE — Progress Notes (Signed)
?Radiation Oncology         [phone] ?________________________________ ? ?Name: Natalie Rice MRN: [id number]  ?Date: 06/30/2021  DOB: [birth year] ? ?Follow-Up Visit Note ? ?CC: Jinny Sanders, MD  Jinny Sanders, MD ? ?No diagnosis found. ? ?Diagnosis: S/p lumpectomy: Stage 0 (cTis (DCIS), cN0, cM0) Right Breast, High-grade DCIS, ER+ / PR+ / Her2 not assessed  ? ?Interval Since Last Radiation: 1 month and 1 day  ? ?Intent: Curative ? ?Radiation Treatment Dates: 05/01/2021 through 05/29/2021 ?Site Technique Total Dose (Gy) Dose per Fx (Gy) Completed Fx Beam Energies  ?Breast, Right: Breast_R 3D 40.05/40.05 2.67 15/15 6XFFF, 10XFFF  ?Breast, Right: Breast_R_Bst 3D 10/10 2 5/5 6X, 10X  ? ? ?Narrative:  The patient returns today for routine follow-up. The patient tolerated radiation therapy relatively well. During her final weekly treatment check on 05/27/21, the patient reported mild pain to right breast and right axilla, fatigue, itching, hyperpigmentation changes, and right breast tenderness. Physical exam performed on that same date revealed some hyperpigmentation changes and erythema to the right breast area, most significant in the lower axillary region. No skin breakdown was appreciated. For her itching, I recommended she obtain some hydrocortisone cream.       ? ?While undergoing RT, the patient followed up with Dr. Burr Medico on 05/23/21. During which time, the patient reported feeling well other than some skin tightening and mild fatigue. In terms of further treatment, Dr. Burr Medico urged the patient to consider  tamoxifen or anastrozole for 3 to 5 years. The patient expressed concern with potential side effects and will return to Dr. Burr Medico in the near future to discuss antiestrogens further.   ? ?Given that the patient has never had a bone density study performed, Dr. Burr Medico has ordered one, as well as a routine screening mammogram.                  ? ?She has some mild tenderness in the lateral aspect of the breast  on occasion but no consistent issues.  She denies any nipple discharge or bleeding.  She denies any problems with swelling in her right arm or hand. ? ?Allergies:  has No Known Allergies. ? ?Meds: ?Current Outpatient Medications  ?Medication Sig Dispense Refill  ? Apoaequorin (PREVAGEN PO) Take 1 tablet by mouth in the morning.    ? atorvastatin (LIPITOR) 10 MG tablet TAKE 1 TABLET DAILY 90 tablet 0  ? Cholecalciferol (VITAMIN D-3) 125 MCG (5000 UT) TABS Take 5,000 Units by mouth in the morning.    ? clotrimazole-betamethasone (LOTRISONE) cream Apply 1 application topically 2 (two) times daily. (Patient not taking: Reported on 04/23/2021) 30 g 1  ? Coenzyme Q10 (COQ10) 200 MG CAPS Take 200 mg by mouth in the morning.    ? Loratadine 10 MG CAPS Take 10 mg by mouth daily as needed (seasonal allergies.).    ? losartan-hydrochlorothiazide (HYZAAR) 50-12.5 MG tablet TAKE 1 TABLET DAILY 90 tablet 0  ? Magnesium 250 MG TABS Take 250 mg by mouth in the morning.    ? Omega-3 Fatty Acids (OMEGA 3 PO) Take 1 capsule by mouth daily.    ? Potassium 99 MG TABS Take 99 mg by mouth in the morning.    ? traMADol (ULTRAM) 50 MG tablet Take 1 tablet (50 mg total) by mouth every 6 (six) hours as needed. (Patient not taking: Reported on 04/23/2021) 25 tablet 0  ? ?No current facility-administered medications for this encounter.  ? ? ?Physical Findings: ?  The patient is in no acute distress. Patient is alert and oriented. ? vitals were not taken for this visit. .  No significant changes. Lungs are clear to auscultation bilaterally. Heart has regular rate and rhythm. No palpable cervical, supraclavicular, or axillary adenopathy. Abdomen soft, non-tender, normal bowel sounds. ? ?Right Breast: no palpable mass, nipple discharge or bleeding.  Mild hyperpigmentation changes noted.  Skin is healed well.  Mild edema in the nipple areolar complex area.  No dominant mass appreciated in the breast.  No palpable mass in the left breast nipple discharge  or bleeding. ? ? ?Lab Findings: ?Lab Results  ?Component Value Date  ? WBC 7.4 03/12/2021  ? HGB 15.1 (H) 03/12/2021  ? HCT 44.9 03/12/2021  ? MCV 83.9 03/12/2021  ? PLT 331 03/12/2021  ? ? ?Radiographic Findings: ?No results found. ? ?Impression: S/p lumpectomy: Stage 0 (cTis (DCIS), cN0, cM0) Right Breast, High-grade DCIS, ER+ / PR+ / Her2 not assessed  ? ?She has recovered well from her radiation therapy.  Overall she tolerated this quite well.  No evidence of recurrence on physical exam today ? ?Plan: As needed follow-up in radiation oncology.  She will continue with periodic follow-ups and medical oncology. ? ? ? ?____________________________________ ? ?Blair Promise, PhD, MD ? ?This document serves as a record of services personally performed by Gery Pray, MD. It was created on his behalf by Roney Mans, a trained medical scribe. The creation of this record is based on the scribe's personal observations and the provider's statements to them. This document has been checked and approved by the attending provider. ? ?

## 2021-06-30 ENCOUNTER — Ambulatory Visit
Admission: RE | Admit: 2021-06-30 | Discharge: 2021-06-30 | Disposition: A | Discharge status: Home / Self Care | Payer: BC Managed Care – PPO | Source: Ambulatory Visit | Attending: Radiation Oncology | Admitting: Radiation Oncology

## 2021-06-30 ENCOUNTER — Encounter: Payer: Self-pay | Admitting: Radiation Oncology

## 2021-06-30 ENCOUNTER — Other Ambulatory Visit: Payer: Self-pay

## 2021-06-30 VITALS — BP 141/80 | HR 80 | Temp 97.9°F | Resp 20 | Ht 59.0 in | Wt 184.4 lb

## 2021-06-30 DIAGNOSIS — R5383 Other fatigue: Secondary | ICD-10-CM | POA: Insufficient documentation

## 2021-06-30 DIAGNOSIS — D0511 Intraductal carcinoma in situ of right breast: Secondary | ICD-10-CM | POA: Insufficient documentation

## 2021-06-30 DIAGNOSIS — Z170 Estrogen receptor positive status [ER+]: Secondary | ICD-10-CM | POA: Insufficient documentation

## 2021-06-30 DIAGNOSIS — Z79899 Other long term (current) drug therapy: Secondary | ICD-10-CM | POA: Insufficient documentation

## 2021-06-30 DIAGNOSIS — Z923 Personal history of irradiation: Secondary | ICD-10-CM | POA: Insufficient documentation

## 2021-06-30 NOTE — Progress Notes (Signed)
Natalie Rice is here today for follow up post radiation to the breast. ? ? Breast Side:Right ? ? ?They completed their radiation on: 05/29/21 ? ?Does the patient complain of any of the following: ?Post radiation skin issues: healing well, occasional mild itching, color is returning to baseline ?Breast Tenderness: intermittent mild tenderness in right lateral breast ?Breast Swelling: denies ?Lymphadema: denies ?Range of Motion limitations: Demonstrates full range of motion ?Fatigue post radiation: denies ?Appetite good/fair/poor: great ? ?Additional comments if applicable:  nothing of note ? ?Vitals:  ? 06/30/21 1547  ?BP: (!) 141/80  ?Pulse: 80  ?Resp: 20  ?Temp: 97.9 ?F (36.6 ?C)  ?SpO2: 100%  ?Weight: 184 lb 6.4 oz (83.6 kg)  ?Height: '4\' 11"'$  (1.499 m)  ? ?  ?

## 2021-07-01 ENCOUNTER — Telehealth: Payer: Self-pay

## 2021-07-01 ENCOUNTER — Other Ambulatory Visit: Payer: Self-pay

## 2021-07-01 NOTE — Telephone Encounter (Signed)
Faxed Dexa order [name] so pt can schedule her bone density test.  Fax confirmation received. ?

## 2021-07-03 ENCOUNTER — Telehealth: Payer: Self-pay

## 2021-07-03 ENCOUNTER — Other Ambulatory Visit: Payer: Self-pay

## 2021-07-03 NOTE — Telephone Encounter (Signed)
Pt called stating she has scheduled her DEXA with [name] but they are telling her that they do not have orders for a DEXA from Dr. Ernestina Penna office.  Pt is currently scheduled and asked if Dr. Burr Medico could send [facility] the order for the DEXA.  Derl Barrow [phone]) pt's DEXA orders and pt demographics.  Fax confirmation received. ?

## 2021-07-07 DIAGNOSIS — Z780 Asymptomatic menopausal state: Secondary | ICD-10-CM | POA: Diagnosis not present

## 2021-07-11 ENCOUNTER — Encounter: Payer: Self-pay | Admitting: *Deleted

## 2021-07-11 ENCOUNTER — Other Ambulatory Visit: Payer: Self-pay | Admitting: Family Medicine

## 2021-07-11 NOTE — Telephone Encounter (Signed)
MyChart message sent to [facility] asking her to call the office to schedule CPE with fasting labs prior with Dr. Diona Browner.  Will need appointment prior to any additional refills.  ?

## 2021-07-23 NOTE — Telephone Encounter (Signed)
Tried to call and schedule, lvm for pt to call back ?

## 2021-08-18 ENCOUNTER — Telehealth: Payer: Self-pay | Admitting: *Deleted

## 2021-08-18 LAB — HM DIABETES EYE EXAM

## 2021-08-19 NOTE — Progress Notes (Unsigned)
CLINIC:  Survivorship   REASON FOR VISIT:  Routine follow-up post-treatment for a recent history of breast cancer.  BRIEF ONCOLOGIC HISTORY:  Oncology History Overview Note   Cancer Staging  Ductal carcinoma in situ (DCIS) of right breast Staging form: Breast, AJCC 8th Edition - Clinical stage from 03/12/2021: Stage 0 (cTis (DCIS), cN0, cM0, ER+, PR+) - Signed by Truitt Merle, MD on 03/12/2021 Stage prefix: Initial diagnosis Nuclear grade: G3 - Pathologic stage from 03/27/2021: Stage Unknown (pTis (DCIS), pNX, cM0, G3, ER+, PR+, HER2: Not Assessed) - Signed by Truitt Merle, MD on 05/23/2021 Stage prefix: Initial diagnosis Histologic grading system: 3 grade system Residual tumor (R): R0 - None     Ductal carcinoma in situ (DCIS) of right breast  02/12/2021 Mammogram   Exam: 3D Mammogram Diagnotic - Right  IMPRESSION: The 1.2 cm grouped fine linear calcifications in the right breast are suspicious.   02/26/2021 Initial Biopsy   Diagnosis Breast, right, needle core biopsy, 9 o'clock, 5.5 cmfn - DUCTAL CARCINOMA IN SITU WITH NECROSIS AND CALCIFICATIONS - SEE COMMENT Microscopic Comment Based on the biopsy, the ductal carcinoma in situ has a comedo pattern, high nuclear grade and measures 0.1 cm in greatest linear extent.  PROGNOSTIC INDICATORS Results: Estrogen Receptor: 90%, POSITIVE, MODERATE STAINING INTENSITY Progesterone Receptor: 40%, POSITIVE, STRONG-MODERATE STAINING INTENSITY   03/05/2021 Initial Diagnosis   Ductal carcinoma in situ (DCIS) of right breast   03/12/2021 Cancer Staging   Staging form: Breast, AJCC 8th Edition - Clinical stage from 03/12/2021: Stage 0 (cTis (DCIS), cN0, cM0, ER+, PR+) - Signed by Truitt Merle, MD on 03/12/2021 Stage prefix: Initial diagnosis Nuclear grade: G3    03/27/2021 Cancer Staging   Staging form: Breast, AJCC 8th Edition - Pathologic stage from 03/27/2021: Stage Unknown (pTis (DCIS), pNX, cM0, G3, ER+, PR+, HER2: Not Assessed) -  Signed by Truitt Merle, MD on 05/23/2021 Stage prefix: Initial diagnosis Histologic grading system: 3 grade system Residual tumor (R): R0 - None     Definitive Surgery   FINAL MICROSCOPIC DIAGNOSIS:   A. BREAST, RIGHT, LUMPECTOMY:  -  Ductal carcinoma in situ (DCIS), grade III (high), with central  ("comedo") necrosis and calcifications, 8 mm in greatest dimension.  -  Resected edges negative for DCIS, distance from DCIS to closest edge 7 mm, anterior.  -  Negative for invasive carcinoma.  -  Seed implants with foreign body reaction and inflammation.   B. BREAST, RIGHT NEW ANTERIOR MARGIN, EXCISION:  -  Benign breast tissue with mild cystic apocrine metaplasia.  -  Negative for DCIS and invasive carcinoma.      INTERVAL HISTORY:  Natalie Rice presents to the [facility] today for our initial meeting to review her survivorship care plan detailing her treatment course for breast cancer, as well as monitoring long-term side effects of that treatment, education regarding health maintenance, screening, and overall wellness and health promotion.     Overall, Natalie Rice reports feeling quite well since completing her radiation therapy approximately 3 months ago.  She ***    REVIEW OF SYSTEMS:  Review of Systems - Oncology Breast: Denies any new nodularity, masses, tenderness, nipple changes, or nipple discharge.      ONCOLOGY TREATMENT TEAM:  1. Surgeon:  Dr. Marland Kitchen at [facility] 2. Medical Oncologist: Dr. Marland Kitchen  3. Radiation Oncologist: Dr. Marland Kitchen    PAST MEDICAL/SURGICAL HISTORY:  Past Medical History:  Diagnosis Date   Breast cancer [facility])    Colon polyp  Diabetes mellitus without complication ([place])    Eosinophilia 03/03/2012   Hay fever    High blood pressure    High cholesterol    History of radiation therapy    right breast 05/01/2021-05/29/2021  Dr Gery Pray   Hyperlipidemia    Sleep apnea    Past Surgical History:  Procedure Laterality Date    BREAST LUMPECTOMY WITH RADIOACTIVE SEED LOCALIZATION Right 03/27/2021   Procedure: RIGHT BREAST LUMPECTOMY WITH RADIOACTIVE SEED LOCALIZATION;  Surgeon: Coralie Keens, MD;  Location: [place];  Service: General;  Laterality: Right;   TUBAL LIGATION       ALLERGIES:  No Known Allergies   CURRENT MEDICATIONS:  Outpatient Encounter Medications as of 08/20/2021  Medication Sig   Apoaequorin (PREVAGEN PO) Take 1 tablet by mouth in the morning.   atorvastatin (LIPITOR) 10 MG tablet TAKE 1 TABLET DAILY   Cholecalciferol (VITAMIN D-3) 125 MCG (5000 UT) TABS Take 5,000 Units by mouth in the morning.   clotrimazole-betamethasone (LOTRISONE) cream Apply 1 application topically 2 (two) times daily. (Patient not taking: Reported on 04/23/2021)   Coenzyme Q10 (COQ10) 200 MG CAPS Take 200 mg by mouth in the morning.   Loratadine 10 MG CAPS Take 10 mg by mouth daily as needed (seasonal allergies.).   losartan-hydrochlorothiazide (HYZAAR) 50-12.5 MG tablet Take 1 tablet by mouth daily. **Need office visit with doctor [place].**   Magnesium 250 MG TABS Take 250 mg by mouth in the morning.   Omega-3 Fatty Acids (OMEGA 3 PO) Take 1 capsule by mouth daily.   Potassium 99 MG TABS Take 99 mg by mouth in the morning.   No facility-administered encounter medications on file as of 08/20/2021.     ONCOLOGIC FAMILY HISTORY:  Family History  Problem Relation Age of Onset   Cancer Mother 18       cervical cancer   Heart disease Father 106       MI   Heart disease Brother 62       MI   Diabetes Maternal Grandmother    Diabetes Maternal Grandfather      GENETIC COUNSELING/TESTING: ***  SOCIAL HISTORY:  Natalie Rice is /single/married/divorced/widowed/separated and lives alone/with her spouse/family/friend in (city), [place].  She has (#) children and they live in (city).  Natalie Rice is currently retired/disabled/working part-time/full-time as ***.  She denies any current or history of tobacco,  alcohol, or illicit drug use.     PHYSICAL EXAMINATION:  Vital Signs:  There were no vitals filed for this visit. There were no vitals filed for this visit. General: Well-nourished, well-appearing female in no acute distress.  She is unaccompanied/accompanied in clinic by her ***** today.   HEENT: Head is normocephalic.  Pupils equal and reactive to light. Conjunctivae clear without exudate.  Sclerae anicteric. Oral mucosa is pink, moist.  Oropharynx is pink without lesions or erythema.  Lymph: No cervical, supraclavicular, or infraclavicular lymphadenopathy noted on palpation.  Cardiovascular: Regular rate and rhythm.Marland Kitchen Respiratory: Clear to auscultation bilaterally. Chest expansion symmetric; breathing non-labored.  GI: Abdomen soft and round; non-tender, non-distended. Bowel sounds normoactive.  GU: Deferred.  Neuro: No focal deficits. Steady gait.  Psych: Mood and affect normal and appropriate for situation.  Extremities: No edema. MSK: No focal spinal tenderness to palpation.  Full range of motion in bilateral upper extremities Skin: Warm and dry.  LABORATORY DATA:  None for this visit.  DIAGNOSTIC IMAGING:  None for this visit.      ASSESSMENT AND PLAN:  Natalie Rice is a pleasant 66 y.o. female with Stage *** right/left breast invasive ductal carcinoma, ER+/PR+/HER2-, diagnosed in (date), treated with lumpectomy, adjuvant radiation therapy, and anti-estrogen therapy with *** beginning in (date).  She presents to the Survivorship Clinic for our initial meeting and routine follow-up post-completion of treatment for breast cancer.    1. Stage *** right/left breast cancer:  Natalie Rice is continuing to recover from definitive treatment for breast cancer. She will follow-up with her medical oncologist, Dr. Ross Ludwig in (month) /2017 with history and physical exam per surveillance protocol.  She will continue her anti-estrogen therapy with (drug). Thus far, she is tolerating  the *** well, with minimal side effects. She was instructed to make Dr. Lindi Adie or myself aware if she begins to experience any worsening side effects of the medication and I could see her back in clinic to help manage those side effects, as needed. Though the incidence is low, there is an associated risk of endometrial cancer with anti-estrogen therapies like Tamoxifen.  Natalie Rice was encouraged to contact Dr. Carrington Clamp or myself with any vaginal bleeding while taking Tamoxifen. Other side effects of Tamoxifen were again reviewed with her as well. Today, a comprehensive survivorship care plan and treatment summary was reviewed with the patient today detailing her breast cancer diagnosis, treatment course, potential late/long-term effects of treatment, appropriate follow-up care with recommendations for the future, and patient education resources.  A copy of this summary, along with a letter will be sent to the patient's primary care provider via mail/fax/In Basket message after today's visit.    #. Problem(s) at Visit______________  #. Bone health:  Given Natalie Rice's age/history of breast cancer and her current treatment regimen including anti-estrogen therapy with _______, she is at risk for bone demineralization.  Her last DEXA scan was **/**/20**, which showed (results).***  In the meantime, she was encouraged to increase her consumption of foods rich in calcium, as well as increase her weight-bearing activities.  She was given education on specific activities to promote bone health.  #. Cancer screening:  Due to Natalie Rice's history and her age, she should receive screening for skin cancers, colon cancer, and gynecologic cancers.  The information and recommendations are listed on the patient's comprehensive care plan/treatment summary and were reviewed in detail with the patient.    #. Health maintenance and wellness promotion: Natalie Rice was encouraged to consume 5-7 servings of fruits and  vegetables per day. We reviewed the "Nutrition Rainbow" handout, as well as the handout "Take Control of Your Health and Reduce Your Cancer Risk" from the [place].  She was also encouraged to engage in moderate to vigorous exercise for 30 minutes per day most days of the week. We discussed the [employer] fitness program, which is designed for cancer survivors to help them become more physically fit after cancer treatments.  She was instructed to limit her alcohol consumption and continue to abstain from tobacco use/***was encouraged stop smoking.     #. Support services/counseling: It is not uncommon for this period of the patient's cancer care trajectory to be one of many emotions and stressors.  We discussed an opportunity for her to participate in the next session of [facility] ("Finding Your New Normal") support group series designed for patients after they have completed treatment.   Natalie Rice was encouraged to take advantage of our many other support services programs, support groups, and/or counseling in coping with her new life as a cancer survivor after  completing anti-cancer treatment.  She was offered support today through active listening and expressive supportive counseling.  She was given information regarding our available services and encouraged to contact me with any questions or for help enrolling in any of our support group/programs.    Dispo:   -Return to cancer center ***  -Mammogram due in *** -Follow up with surgery *** -She is welcome to return back to the Survivorship Clinic at any time; no additional follow-up needed at this time.  -Consider referral back to survivorship as a long-term survivor for continued surveillance  A total of (30) minutes of face-to-face time was spent with this patient with greater than 50% of that time in counseling and care-coordination.   Cira Rue, NP Survivorship Program [facility] [phone]   Note:  PRIMARY CARE PROVIDER Jinny Sanders, [place] [phone]

## 2021-08-20 ENCOUNTER — Encounter: Payer: Self-pay | Admitting: Nurse Practitioner

## 2021-08-20 ENCOUNTER — Other Ambulatory Visit: Payer: Self-pay

## 2021-08-20 ENCOUNTER — Inpatient Hospital Stay: Payer: Medicare Other | Attending: Hematology | Admitting: Nurse Practitioner

## 2021-08-20 VITALS — BP 160/84 | HR 65 | Temp 98.0°F | Resp 16 | Ht 59.0 in | Wt 184.8 lb

## 2021-08-20 DIAGNOSIS — Z170 Estrogen receptor positive status [ER+]: Secondary | ICD-10-CM | POA: Diagnosis not present

## 2021-08-20 DIAGNOSIS — Z923 Personal history of irradiation: Secondary | ICD-10-CM | POA: Insufficient documentation

## 2021-08-20 DIAGNOSIS — Z780 Asymptomatic menopausal state: Secondary | ICD-10-CM | POA: Diagnosis not present

## 2021-08-20 DIAGNOSIS — D0511 Intraductal carcinoma in situ of right breast: Secondary | ICD-10-CM | POA: Insufficient documentation

## 2021-08-20 MED ORDER — ANASTROZOLE 1 MG PO TABS: 1.0000 mg | ORAL_TABLET | Freq: Every day | ORAL | 3 refills | Status: DC

## 2021-08-21 ENCOUNTER — Telehealth: Payer: Self-pay | Admitting: Hematology

## 2021-08-21 NOTE — Telephone Encounter (Signed)
Scheduled follow-up appointment per 6/7 los. Patient is aware. 

## 2021-08-25 ENCOUNTER — Encounter: Payer: Self-pay | Admitting: Family Medicine

## 2021-08-26 ENCOUNTER — Telehealth: Payer: Self-pay | Admitting: Family Medicine

## 2021-08-26 DIAGNOSIS — E1159 Type 2 diabetes mellitus with other circulatory complications: Secondary | ICD-10-CM

## 2021-08-26 DIAGNOSIS — E1169 Type 2 diabetes mellitus with other specified complication: Secondary | ICD-10-CM

## 2021-08-26 DIAGNOSIS — I152 Hypertension secondary to endocrine disorders: Secondary | ICD-10-CM

## 2021-08-26 MED ORDER — LOSARTAN POTASSIUM-HCTZ 50-12.5 MG PO TABS: ORAL_TABLET | ORAL | 0 refills | Status: DC

## 2021-08-26 MED ORDER — ATORVASTATIN CALCIUM 10 MG PO TABS: 10.0000 mg | ORAL_TABLET | Freq: Every day | ORAL | 0 refills | Status: DC

## 2021-08-26 NOTE — Telephone Encounter (Signed)
Pt called in requesting refills stated she is now with [facility] [employer] and use [facility] [phone] fax [phone]    Encourage patient to contact the pharmacy for refills or they can request refills through [place]:  Please schedule appointment if longer than 1 year  NEXT APPOINTMENT DATE:  MEDICATION:atorvastatin (LIPITOR) 10 MG tablet losartan-hydrochlorothiazide (HYZAAR) 50-12.5 MG tablet  Is the patient out of medication?   PHARMACY:  Let patient know to contact pharmacy at the end of the day to make sure medication is ready.  Please notify patient to allow 48-72 hours to process  CLINICAL FILLS OUT ALL BELOW:   LAST REFILL:  QTY:  REFILL DATE:    OTHER COMMENTS:    Okay for refill?  Please advise

## 2021-08-26 NOTE — Telephone Encounter (Signed)
Refills sent as requested.  CPE scheduled 09/23/21.

## 2021-09-01 ENCOUNTER — Ambulatory Visit (INDEPENDENT_AMBULATORY_CARE_PROVIDER_SITE_OTHER): Payer: Medicare Other

## 2021-09-01 ENCOUNTER — Encounter: Payer: Self-pay | Admitting: Physician Assistant

## 2021-09-01 ENCOUNTER — Ambulatory Visit: Payer: Medicare Other | Admitting: Physician Assistant

## 2021-09-01 DIAGNOSIS — M25552 Pain in left hip: Secondary | ICD-10-CM

## 2021-09-01 MED ORDER — CYCLOBENZAPRINE HCL 10 MG PO TABS: 10.0000 mg | ORAL_TABLET | Freq: Every day | ORAL | 0 refills | Status: DC

## 2021-09-01 MED ORDER — METHYLPREDNISOLONE 4 MG PO TABS: ORAL_TABLET | ORAL | 0 refills | Status: DC

## 2021-09-01 NOTE — Progress Notes (Signed)
Office Visit Note   Patient: Natalie Rice           Date of Birth: 1956/01/04           MRN: [id number] Visit Date: 09/01/2021              Requested by: Jinny Sanders, MD [place],  [place] [address] PCP: Jinny Sanders, MD   Assessment & Plan: Visit Diagnoses:  1. Pain of left hip   2. Pain in left hip     Plan: She is given a prescription for therapy to work on range of motion, stretching, core strengthening, modalities and home exercise program.  She is placed on a Medrol Dosepak no NSAIDs while on the Medrol Dosepak.  She is also given Flexeril to take mainly at night.  We will see her back in just 6 weeks to see how she is doing overall she will return sooner if her condition becomes worse in any way.  Follow-Up Instructions: Return in about 6 weeks (around 10/13/2021).   Orders:  Orders Placed This Encounter  Procedures   XR Lumbar Spine 2-3 Views   XR HIP UNILAT W OR W/O PELVIS 2-3 VIEWS LEFT   Meds ordered this encounter  Medications   methylPREDNISolone (MEDROL) 4 MG tablet    Sig: Take as directed    Dispense:  21 tablet    Refill:  0   cyclobenzaprine (FLEXERIL) 10 MG tablet    Sig: Take 1 tablet (10 mg total) by mouth at bedtime.    Dispense:  30 tablet    Refill:  0      Procedures: No procedures performed   Clinical Data: No additional findings.   Subjective: Chief Complaint  Patient presents with   Left Hip - Pain    HPI Natalie Rice is a 66 year old female were seen for the first time for left hip pain.  Is been ongoing for the past month no known injury.  Pain is worse in the morning also after sitting for too long.  Left-sided pain involving the left buttocks thigh and tingling into the knee.  7 out of 10 pain at worst.  Pain awakens her.  No bowel or bladder dysfunction no fevers or chills.  Denies unintentional weight loss.  Denies any saddle anesthesia like symptoms.  She has tried Tylenol and Aleve with no real  relief.  No significant back pain. Review of Systems See HPI  Objective: Vital Signs: LMP 12/26/2011   Physical Exam General well-developed well-nourished female who ambulates without any assistive device. Psych: Alert and oriented x3 Vascular: Dorsal pedal pulses are 2+ and equal symmetric.  Calf supple nontender. Ortho Exam Lower extremities 5 out of 5 strength throughout against resistance.  Tight hamstrings bilaterally.  Straight leg raise is negative bilaterally.  Tenderness left lumbar lower paraspinous region.  Excellent range of motion bilateral hips without pain.  Nontender over the trochanteric region both hips and down the IT bands bilaterally. Specialty Comments:  No specialty comments available.  Imaging: XR HIP UNILAT W OR W/O PELVIS 2-3 VIEWS LEFT  Result Date: 09/01/2021 AP pelvis lateral view of the left hip: No acute fractures.  Bilateral hips well located.  Hip joints well-maintained bilaterally.  XR Lumbar Spine 2-3 Views  Result Date: 09/01/2021 Lumbar spine 2 views: No acute fractures.  Slight loss of disc space at L4-5.  Grade 1 spondylolisthesis L4-5.  Normal lordotic curvature.  Otherwise remainder lumbar spine is  well-preserved.    PMFS History: Patient Active Problem List   Diagnosis Date Noted   Genetic testing 03/12/2021   Ductal carcinoma in situ (DCIS) of right breast 03/05/2021   Nose abnormality 05/29/2020   Diabetes mellitus without complication ([place]) 43/32/9518   Obesity, Class II, BMI 35-39.9, no comorbidity ([place]) 11/22/2015   Family history of early CAD 06/07/2013   Gout 09/19/2012   Left femoral vein DVT, hx of 09/15/2012   Hypertension associated with diabetes ([place])    Hyperlipidemia associated with type 2 diabetes mellitus ([place])    Past Medical History:  Diagnosis Date   Breast cancer ([place])    Colon polyp    Diabetes mellitus without complication ([place])    Eosinophilia 03/03/2012   Hay fever    High blood pressure    High  cholesterol    History of radiation therapy    right breast 05/01/2021-05/29/2021  Dr Gery Pray   Hyperlipidemia    Sleep apnea     Family History  Problem Relation Age of Onset   Cancer Mother 91       cervical cancer   Heart disease Father 51       MI   Heart disease Brother 62       MI   Diabetes Maternal Grandmother    Diabetes Maternal Grandfather     Past Surgical History:  Procedure Laterality Date   BREAST LUMPECTOMY WITH RADIOACTIVE SEED LOCALIZATION Right 03/27/2021   Procedure: RIGHT BREAST LUMPECTOMY WITH RADIOACTIVE SEED LOCALIZATION;  Surgeon: Coralie Keens, MD;  Location: [place];  Service: General;  Laterality: Right;   TUBAL LIGATION     Social History   Occupational History    Employer: [employer]    Comment: [employer]  Tobacco Use   Smoking status: Former    Packs/day: 1.00    Years: 3.00    Total pack years: 3.00    Types: Cigarettes    Quit date: 03/16/2005    Years since quitting: 16.4   Smokeless tobacco: Never   Tobacco comments:    quit 2007  Vaping Use   Vaping Use: Never used  Substance and Sexual Activity   Alcohol use: Yes    Alcohol/week: 0.0 standard drinks of alcohol    Comment: occassionally   Drug use: No   Sexual activity: Not Currently

## 2021-09-05 ENCOUNTER — Other Ambulatory Visit: Payer: Self-pay | Admitting: Family Medicine

## 2021-09-05 DIAGNOSIS — E1169 Type 2 diabetes mellitus with other specified complication: Secondary | ICD-10-CM

## 2021-09-15 ENCOUNTER — Other Ambulatory Visit (INDEPENDENT_AMBULATORY_CARE_PROVIDER_SITE_OTHER): Payer: Medicare Other

## 2021-09-15 ENCOUNTER — Telehealth: Payer: Self-pay | Admitting: Family Medicine

## 2021-09-15 DIAGNOSIS — E119 Type 2 diabetes mellitus without complications: Secondary | ICD-10-CM

## 2021-09-15 DIAGNOSIS — E1169 Type 2 diabetes mellitus with other specified complication: Secondary | ICD-10-CM

## 2021-09-15 LAB — COMPREHENSIVE METABOLIC PANEL
ALT: 31 U/L (ref 0–35)
AST: 20 U/L (ref 0–37)
Albumin: 4.1 g/dL (ref 3.5–5.2)
Alkaline Phosphatase: 77 U/L (ref 39–117)
BUN: 9 mg/dL (ref 6–23)
CO2: 29 mEq/L (ref 19–32)
Calcium: 10.1 mg/dL (ref 8.4–10.5)
Chloride: 102 mEq/L (ref 96–112)
Creatinine, Ser: 0.86 mg/dL (ref 0.40–1.20)
GFR: 71.03 mL/min (ref >60.00)
Glucose, Bld: 121 mg/dL — ABNORMAL HIGH (ref 70–99)
Potassium: 3.8 mEq/L (ref 3.5–5.1)
Sodium: 141 mEq/L (ref 135–145)
Total Bilirubin: 0.7 mg/dL (ref 0.2–1.2)
Total Protein: 6.6 g/dL (ref 6.0–8.3)

## 2021-09-15 LAB — LIPID PANEL
Cholesterol: 230 mg/dL — ABNORMAL HIGH (ref 0–200)
HDL: 56.5 mg/dL (ref >39.00)
LDL Cholesterol: 142 mg/dL — ABNORMAL HIGH (ref 0–99)
NonHDL: 173.12
Total CHOL/HDL Ratio: 4
Triglycerides: 157 mg/dL — ABNORMAL HIGH (ref 0.0–149.0)
VLDL: 31.4 mg/dL (ref 0.0–40.0)

## 2021-09-15 LAB — HEMOGLOBIN A1C: Hgb A1c MFr Bld: 6.6 % — ABNORMAL HIGH (ref 4.6–6.5)

## 2021-09-15 NOTE — Telephone Encounter (Signed)
-----   Message from Lerry Liner sent at 09/01/2021 11:47 AM EDT ----- Regarding: labs for cpe, Monday September 15, 2021 Labs for Monday September 15, 2021  for CPE.  Thank you, Deb

## 2021-09-15 NOTE — Progress Notes (Signed)
No critical labs need to be addressed urgently. We will discuss labs in detail at upcoming office visit.   

## 2021-09-23 ENCOUNTER — Encounter: Payer: Self-pay | Admitting: Family Medicine

## 2021-09-23 ENCOUNTER — Ambulatory Visit (INDEPENDENT_AMBULATORY_CARE_PROVIDER_SITE_OTHER): Payer: Medicare Other | Admitting: Family Medicine

## 2021-09-23 VITALS — BP 132/86 | HR 73 | Temp 98.2°F | Ht 59.25 in | Wt 182.5 lb

## 2021-09-23 DIAGNOSIS — E119 Type 2 diabetes mellitus without complications: Secondary | ICD-10-CM

## 2021-09-23 DIAGNOSIS — E669 Obesity, unspecified: Secondary | ICD-10-CM | POA: Diagnosis not present

## 2021-09-23 DIAGNOSIS — Z1211 Encounter for screening for malignant neoplasm of colon: Secondary | ICD-10-CM | POA: Diagnosis not present

## 2021-09-23 DIAGNOSIS — E785 Hyperlipidemia, unspecified: Secondary | ICD-10-CM

## 2021-09-23 DIAGNOSIS — D0511 Intraductal carcinoma in situ of right breast: Secondary | ICD-10-CM

## 2021-09-23 DIAGNOSIS — E1169 Type 2 diabetes mellitus with other specified complication: Secondary | ICD-10-CM

## 2021-09-23 DIAGNOSIS — I152 Hypertension secondary to endocrine disorders: Secondary | ICD-10-CM

## 2021-09-23 DIAGNOSIS — E1159 Type 2 diabetes mellitus with other circulatory complications: Secondary | ICD-10-CM

## 2021-09-23 DIAGNOSIS — Z23 Encounter for immunization: Secondary | ICD-10-CM

## 2021-09-23 DIAGNOSIS — Z0000 Encounter for general adult medical examination without abnormal findings: Secondary | ICD-10-CM | POA: Diagnosis not present

## 2021-09-23 LAB — HM DIABETES FOOT EXAM

## 2021-09-23 NOTE — Assessment & Plan Note (Addendum)
Chronic, diet controlled  Discussed option of starting GLP1 given  BMI 36 and diabetes.

## 2021-09-23 NOTE — Assessment & Plan Note (Signed)
S/P lumpectomy and radiation. Current active treatment.. starting anastrozole next week.  followed by oncology Dr. Burr Medico.

## 2021-09-23 NOTE — Telephone Encounter (Signed)
error 

## 2021-09-23 NOTE — Patient Instructions (Addendum)
We will work on setting up referral for colonoscopy.  Increase atorvastatin to 20 mg daily for a trial.  If you are able to tolerated higher dose of atorvastatin.. make appt for cholesterol check in 3 months for re-evaluation.  Make follow up appt for possible ozempic start if insurance covers.. call to discuss with insurance carrier.

## 2021-09-23 NOTE — Assessment & Plan Note (Addendum)
Chronic, poor control despite atorvastatin 10 mg daily.. increased to 20 mg daily... call for refill of higher dose med if tolerating trial.

## 2021-09-23 NOTE — Progress Notes (Signed)
Patient ID: Natalie Rice, female    DOB: 12-18-1955, 66 y.o.   MRN: [id number]  This visit was conducted in person.  Pulse 73   Temp 98.2 F (36.8 C) (Oral)   Ht 4' 11.25" (1.505 m)   Wt 182 lb 8 oz (82.8 kg)   LMP 12/26/2011   SpO2 97%   BMI 36.55 kg/m    CC:  Chief Complaint  Patient presents with   Welcome to Medicare    Subjective:   HPI: Natalie Rice is a 66 y.o. female presenting on 09/23/2021 for Welcome to Medicare  The patient presents for welcome to medicare wellness, complete physical and review of chronic health problems. He/She also has the following acute concerns today:   Breast cancer in situ s/p  lumpectomy and radia tion  Onc Dr. Burr Medico  Planning anastrozole start.   Hip pain.. s/p prednisone taper.. started PT tommorow. Result Date: 09/01/2021 AP pelvis lateral view of the left hip: No acute fractures.  Bilateral hips well located.  Hip joints well-maintained bilaterally.   XR Lumbar Spine 2-3 Views   Result Date: 09/01/2021 Lumbar spine 2 views: No acute fractures.  Slight loss of disc space at L4-5.  Grade 1 spondylolisthesis L4-5.  Normal lordotic curvature.  Otherwise remainder lumbar spine is well-preserved.   Diabetes: Diet controlled Lab Results  Component Value Date   HGBA1C 6.6 (H) 09/15/2021  Using medications without difficulties: Hypoglycemic episodes: Hyperglycemic episodes: Feet problems: non ulcers Blood Sugars averaging: eye exam within last year: yes  Hypertension:   Good control on losartan hydrochlorothiazide 50/12.5 mg 1 tablet p.o. daily BP Readings from Last 3 Encounters:  09/23/21 132/86  08/20/21 (!) 160/84  06/30/21 (!) 141/80  Using medication without problems or lightheadedness:  none Chest pain with exertion: none Edema: none Short of breath: none Average home BPs: Other issues:  Elevated Cholesterol: LDL not at goal less than 100 despite change from pravastatin to Lipitor 10 mg daily. Lab Results   Component Value Date   CHOL 230 (H) 09/15/2021   HDL 56.50 09/15/2021   LDLCALC 142 (H) 09/15/2021   TRIG 157.0 (H) 09/15/2021   CHOLHDL 4 09/15/2021  Using medications without problems: Muscle aches:  Diet compliance: no meat Exercise: Now going to gym several days a week. Other complaints:  Wt Readings from Last 3 Encounters:  09/23/21 182 lb 8 oz (82.8 kg)  08/20/21 184 lb 12.8 oz (83.8 kg)  06/30/21 184 lb 6.4 oz (83.6 kg)   Gout:   none Lab Results  Component Value Date   LABURIC 7.0 05/24/2020    Relevant past medical, surgical, family and social history reviewed and updated as indicated. Interim medical history since our last visit reviewed. Allergies and medications reviewed and updated. Outpatient Medications Prior to Visit  Medication Sig Dispense Refill   anastrozole (ARIMIDEX) 1 MG tablet Take 1 tablet (1 mg total) by mouth daily. 30 tablet 3   Apoaequorin (PREVAGEN PO) Take 1 tablet by mouth in the morning.     atorvastatin (LIPITOR) 10 MG tablet TAKE 1 TABLET BY MOUTH EVERY DAY 30 tablet 0   Cholecalciferol (VITAMIN D-3) 125 MCG (5000 UT) TABS Take 5,000 Units by mouth in the morning.     clotrimazole-betamethasone (LOTRISONE) cream Apply 1 application topically 2 (two) times daily. (Patient not taking: Reported on 04/23/2021) 30 g 1   Coenzyme Q10 (COQ10) 200 MG CAPS Take 200 mg by mouth in the morning.  cyclobenzaprine (FLEXERIL) 10 MG tablet Take 1 tablet (10 mg total) by mouth at bedtime. 30 tablet 0   Loratadine 10 MG CAPS Take 10 mg by mouth daily as needed (seasonal allergies.).     losartan-hydrochlorothiazide (HYZAAR) 50-12.5 MG tablet Take 1 tablet by mouth daily. 90 tablet 0   Magnesium 250 MG TABS Take 250 mg by mouth in the morning.     methylPREDNISolone (MEDROL) 4 MG tablet Take as directed 21 tablet 0   Omega-3 Fatty Acids (OMEGA 3 PO) Take 1 capsule by mouth daily.     Potassium 99 MG TABS Take 99 mg by mouth in the morning.     No  facility-administered medications prior to visit.     Per HPI unless specifically indicated in ROS section below Review of Systems  Constitutional:  Negative for fatigue and fever.  HENT:  Negative for congestion.   Eyes:  Negative for pain.  Respiratory:  Negative for cough and shortness of breath.   Cardiovascular:  Negative for chest pain, palpitations and leg swelling.  Gastrointestinal:  Negative for abdominal pain.  Genitourinary:  Negative for dysuria and vaginal bleeding.  Musculoskeletal:  Negative for back pain.  Neurological:  Negative for syncope, light-headedness and headaches.  Psychiatric/Behavioral:  Negative for dysphoric mood.    Objective:  Pulse 73   Temp 98.2 F (36.8 C) (Oral)   Ht 4' 11.25" (1.505 m)   Wt 182 lb 8 oz (82.8 kg)   LMP 12/26/2011   SpO2 97%   BMI 36.55 kg/m   Wt Readings from Last 3 Encounters:  09/23/21 182 lb 8 oz (82.8 kg)  08/20/21 184 lb 12.8 oz (83.8 kg)  06/30/21 184 lb 6.4 oz (83.6 kg)      Physical Exam Vitals and nursing note reviewed.  Constitutional:      General: She is not in acute distress.    Appearance: Normal appearance. She is well-developed. She is not ill-appearing or toxic-appearing.  HENT:     Head: Normocephalic.     Right Ear: Hearing, tympanic membrane, ear canal and external ear normal.     Left Ear: Hearing, tympanic membrane, ear canal and external ear normal.     Nose: Nose normal.  Eyes:     General: Lids are normal. Lids are everted, no foreign bodies appreciated.     Conjunctiva/sclera: Conjunctivae normal.     Pupils: Pupils are equal, round, and reactive to light.  Neck:     Thyroid: No thyroid mass or thyromegaly.     Vascular: No carotid bruit.     Trachea: Trachea normal.  Cardiovascular:     Rate and Rhythm: Normal rate and regular rhythm.     Heart sounds: Normal heart sounds, S1 normal and S2 normal. No murmur heard.    No gallop.  Pulmonary:     Effort: Pulmonary effort is normal. No  respiratory distress.     Breath sounds: Normal breath sounds. No wheezing, rhonchi or rales.  Abdominal:     General: Bowel sounds are normal. There is no distension or abdominal bruit.     Palpations: Abdomen is soft. There is no fluid wave or mass.     Tenderness: There is no abdominal tenderness. There is no guarding or rebound.     Hernia: No hernia is present.  Musculoskeletal:     Cervical back: Normal range of motion and neck supple.  Lymphadenopathy:     Cervical: No cervical adenopathy.  Skin:    General:  Skin is warm and dry.     Findings: No rash.  Neurological:     Mental Status: She is alert.     Cranial Nerves: No cranial nerve deficit.     Sensory: No sensory deficit.  Psychiatric:        Mood and Affect: Mood is not anxious or depressed.        Speech: Speech normal.        Behavior: Behavior normal. Behavior is cooperative.        Judgment: Judgment normal.    Diabetic foot exam: Normal inspection No skin breakdown No calluses  Normal DP pulses Normal sensation to light touch and monofilament Nails normal     Results for orders placed or performed in visit on 09/15/21  Comprehensive metabolic panel  Result Value Ref Range   Sodium 141 135 - 145 mEq/L   Potassium 3.8 3.5 - 5.1 mEq/L   Chloride 102 96 - 112 mEq/L   CO2 29 19 - 32 mEq/L   Glucose, Bld 121 (H) 70 - 99 mg/dL   BUN 9 6 - 23 mg/dL   Creatinine, Ser 0.86 0.40 - 1.20 mg/dL   Total Bilirubin 0.7 0.2 - 1.2 mg/dL   Alkaline Phosphatase 77 39 - 117 U/L   AST 20 0 - 37 U/L   ALT 31 0 - 35 U/L   Total Protein 6.6 6.0 - 8.3 g/dL   Albumin 4.1 3.5 - 5.2 g/dL   GFR 71.03 >60.00 mL/min   Calcium 10.1 8.4 - 10.5 mg/dL  Lipid panel  Result Value Ref Range   Cholesterol 230 (H) 0 - 200 mg/dL   Triglycerides 157.0 (H) 0.0 - 149.0 mg/dL   HDL 56.50 >39.00 mg/dL   VLDL 31.4 0.0 - 40.0 mg/dL   LDL Cholesterol 142 (H) 0 - 99 mg/dL   Total CHOL/HDL Ratio 4    NonHDL 173.12   Hemoglobin A1c  Result  Value Ref Range   Hgb A1c MFr Bld 6.6 (H) 4.6 - 6.5 %     COVID 19 screen:  No recent travel or known exposure to COVID19 The patient denies respiratory symptoms of COVID 19 at this time. The importance of social distancing was discussed today.   Assessment and Plan   The patient's preventative maintenance and recommended screening tests for an annual wellness exam were reviewed in full today. Brought up to date unless services declined.  Counselled on the importance of diet, exercise, and its role in overall health and mortality. The patient's FH and SH was reviewed, including their home life, tobacco status, and drug and alcohol status.   Vaccines:uptodate with td, COVID x 3 and flu...  DUE for  prevnar 20 ( receive PNA 23 last year Colon: Nml 2012, plan repeat DUE Mammo 01/2021 PAP/DVE:  Nml, neg HPV 06/2013, repeat in 2020  no history of abnormals.  04/2019 normal  Bone density: Normal 06/2021 Nonsmoker  Hep C: done  HIV:  refused   Problem List Items Addressed This Visit     Diabetes mellitus without complication (HCC) (Chronic)    Chronic, diet controlled  Discussed option of starting GLP1 given  BMI 36 and diabetes.      Hyperlipidemia associated with type 2 diabetes mellitus (HCC) (Chronic)    Chronic, poor control despite atorvastatin 10 mg daily.. increased to 20 mg daily... call for refill of higher dose med if tolerating trial.      Hypertension associated with diabetes ([place]) (Chronic)  Chronic, well controlled on losartan hydrochlorothiazide 50/12.5 mg p.o. daily      Obesity, Class II, BMI 35-39.9, no comorbidity (HCC) (Chronic)    Wt Readings from Last 3 Encounters:  09/23/21 182 lb 8 oz (82.8 kg)  08/20/21 184 lb 12.8 oz (83.8 kg)  06/30/21 184 lb 6.4 oz (83.6 kg)  Body mass index is 36.55 kg/m.  Chronic, poor control Encouraged exercise, weight loss, healthy eating habits.       Ductal carcinoma in situ (DCIS) of right breast      S/P lumpectomy  and radiation. Current active treatment.. starting anastrozole next week.  followed by oncology Dr. Burr Medico.      Other Visit Diagnoses     Welcome to Medicare preventive visit    -  Primary   Colon cancer screening       Relevant Orders   Ambulatory referral to Gastroenterology        Eliezer Lofts, MD

## 2021-09-23 NOTE — Assessment & Plan Note (Signed)
Chronic, well controlled on losartan hydrochlorothiazide 50/12.5 mg p.o. daily

## 2021-09-23 NOTE — Assessment & Plan Note (Signed)
Wt Readings from Last 3 Encounters:  09/23/21 182 lb 8 oz (82.8 kg)  08/20/21 184 lb 12.8 oz (83.8 kg)  06/30/21 184 lb 6.4 oz (83.6 kg)   Body mass index is 36.55 kg/m.  Chronic, poor control Encouraged exercise, weight loss, healthy eating habits.

## 2021-09-24 DIAGNOSIS — M5416 Radiculopathy, lumbar region: Secondary | ICD-10-CM | POA: Diagnosis not present

## 2021-10-06 DIAGNOSIS — M5416 Radiculopathy, lumbar region: Secondary | ICD-10-CM | POA: Diagnosis not present

## 2021-10-07 IMAGING — DX DG CHEST 2V
2 series · 2 of 2 positions shown · non-contrast
Comparison: None.

CLINICAL DATA: Cough and wheezing for 2 weeks.

EXAM:
CHEST - 2 VIEW

[chest pa]
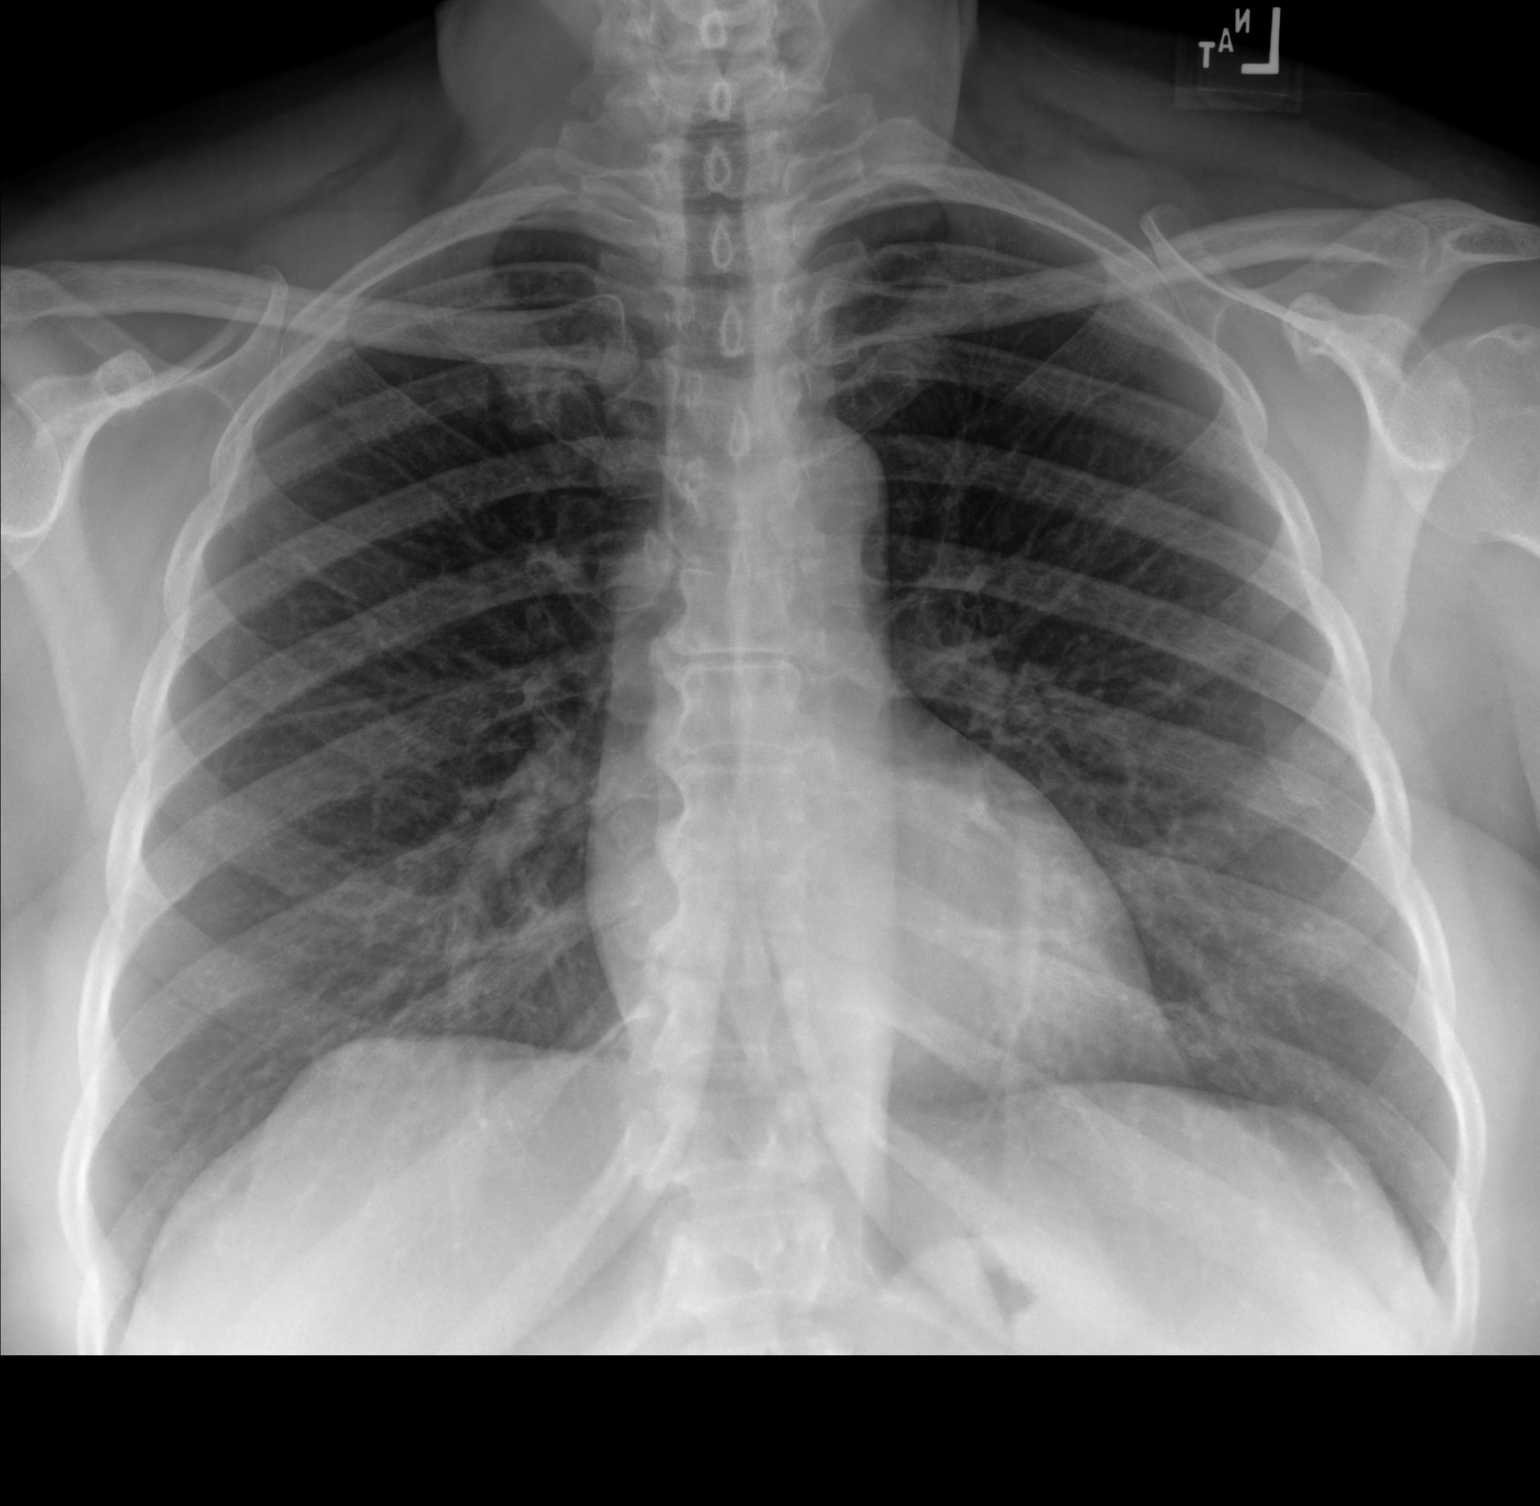

[chest lat]
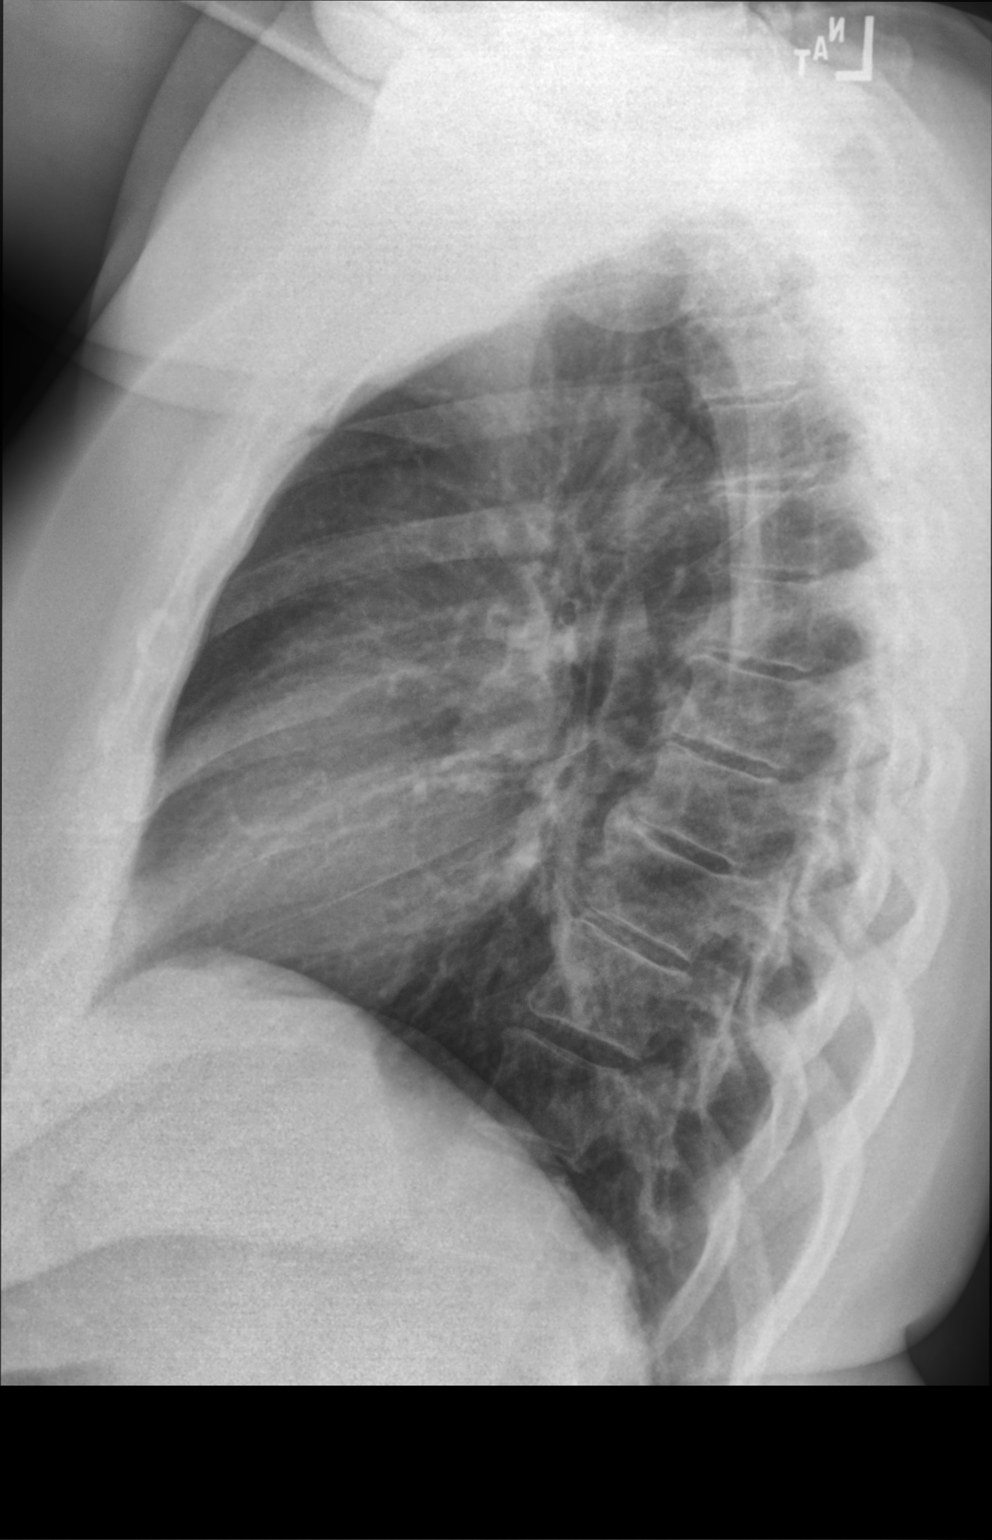

[2 of 2 positions shown; findings below may reference images not displayed]

FINDINGS: The heart size and mediastinal contours are within normal limits.
Both lungs are clear. The visualized skeletal structures are
unremarkable.
IMPRESSION: No active cardiopulmonary disease.

## 2021-11-05 ENCOUNTER — Other Ambulatory Visit: Payer: Self-pay | Admitting: Family Medicine

## 2021-11-05 DIAGNOSIS — E1159 Type 2 diabetes mellitus with other circulatory complications: Secondary | ICD-10-CM

## 2021-11-05 DIAGNOSIS — E1169 Type 2 diabetes mellitus with other specified complication: Secondary | ICD-10-CM

## 2021-11-12 ENCOUNTER — Encounter: Payer: Self-pay | Admitting: Gastroenterology

## 2021-11-19 ENCOUNTER — Other Ambulatory Visit: Payer: Self-pay

## 2021-11-19 DIAGNOSIS — D0511 Intraductal carcinoma in situ of right breast: Secondary | ICD-10-CM

## 2021-11-20 ENCOUNTER — Other Ambulatory Visit: Payer: Self-pay

## 2021-11-20 ENCOUNTER — Encounter: Payer: Self-pay | Admitting: Hematology

## 2021-11-20 ENCOUNTER — Inpatient Hospital Stay: Payer: Medicare Other | Admitting: Hematology

## 2021-11-20 ENCOUNTER — Inpatient Hospital Stay: Payer: Medicare Other | Attending: Hematology

## 2021-11-20 VITALS — BP 147/75 | HR 77 | Temp 98.4°F | Resp 18 | Ht 59.5 in | Wt 181.4 lb

## 2021-11-20 DIAGNOSIS — Z923 Personal history of irradiation: Secondary | ICD-10-CM | POA: Diagnosis not present

## 2021-11-20 DIAGNOSIS — D0511 Intraductal carcinoma in situ of right breast: Secondary | ICD-10-CM | POA: Diagnosis not present

## 2021-11-20 DIAGNOSIS — Z780 Asymptomatic menopausal state: Secondary | ICD-10-CM | POA: Insufficient documentation

## 2021-11-20 DIAGNOSIS — Z170 Estrogen receptor positive status [ER+]: Secondary | ICD-10-CM | POA: Insufficient documentation

## 2021-11-20 DIAGNOSIS — Z79811 Long term (current) use of aromatase inhibitors: Secondary | ICD-10-CM | POA: Insufficient documentation

## 2021-11-20 DIAGNOSIS — M25552 Pain in left hip: Secondary | ICD-10-CM | POA: Insufficient documentation

## 2021-11-20 LAB — CBC WITH DIFFERENTIAL (CANCER CENTER ONLY)
Abs Immature Granulocytes: 0.02 10*3/uL (ref 0.00–0.07)
Basophils Absolute: 0.1 10*3/uL (ref 0.0–0.1)
Basophils Relative: 1 %
Eosinophils Absolute: 0.5 10*3/uL (ref 0.0–0.5)
Eosinophils Relative: 9 %
HCT: 43.1 % (ref 36.0–46.0)
Hemoglobin: 14.6 g/dL (ref 12.0–15.0)
Immature Granulocytes: 0 %
Lymphocytes Relative: 23 %
Lymphs Abs: 1.4 10*3/uL (ref 0.7–4.0)
MCH: 29 pg (ref 26.0–34.0)
MCHC: 33.9 g/dL (ref 30.0–36.0)
MCV: 85.5 fL (ref 80.0–100.0)
Monocytes Absolute: 0.6 10*3/uL (ref 0.1–1.0)
Monocytes Relative: 10 %
Neutro Abs: 3.4 10*3/uL (ref 1.7–7.7)
Neutrophils Relative %: 57 %
Platelet Count: 275 10*3/uL (ref 150–400)
RBC: 5.04 MIL/uL (ref 3.87–5.11)
RDW: 13.4 % (ref 11.5–15.5)
WBC Count: 6 10*3/uL (ref 4.0–10.5)
nRBC: 0 % (ref 0.0–0.2)

## 2021-11-20 LAB — CMP (CANCER CENTER ONLY)
ALT: 29 U/L (ref 0–44)
AST: 21 U/L (ref 15–41)
Albumin: 4.3 g/dL (ref 3.5–5.0)
Alkaline Phosphatase: 84 U/L (ref 38–126)
Anion gap: 5 (ref 5–15)
BUN: 14 mg/dL (ref 8–23)
CO2: 31 mmol/L (ref 22–32)
Calcium: 10.3 mg/dL (ref 8.9–10.3)
Chloride: 104 mmol/L (ref 98–111)
Creatinine: 0.88 mg/dL (ref 0.44–1.00)
GFR, Estimated: >60 mL/min (ref >60)
Glucose, Bld: 97 mg/dL (ref 70–99)
Potassium: 3.7 mmol/L (ref 3.5–5.1)
Sodium: 140 mmol/L (ref 135–145)
Total Bilirubin: 0.6 mg/dL (ref 0.3–1.2)
Total Protein: 7.3 g/dL (ref 6.5–8.1)

## 2021-11-20 NOTE — Progress Notes (Signed)
[facility]   [phone] [phone]   Clinic Follow up Note   Patient Care Team: Jinny Sanders, MD as PCP - General (Family Medicine) Rockwell Germany, RN as Oncology Nurse Navigator Mauro Kaufmann, RN as Oncology Nurse Navigator Truitt Merle, MD as Consulting Physician (Medical Oncology) Gery Pray, MD as Consulting Physician (Radiation Oncology) Coralie Keens, MD as Consulting Physician (General Surgery) Alla Feeling, NP as Nurse Practitioner (Nurse Practitioner)  Date of Service:  11/20/2021  CHIEF COMPLAINT: f/u of right breast DCIS  CURRENT THERAPY:  To start anastrozole, 11/2021  ASSESSMENT & PLAN:  Natalie Rice is a 66 y.o. post-menopausal female with   1. Right breast DCIS, grade 3, ER+/PR+ -found on screening mammogram on 01/23/21. S/p right lumpectomy on 03/27/21 by Dr. [name] again showed high grade DCIS, 8 mm, no invasive carcinoma, margins negative. -s/p radiation under Dr. Sondra Come, 05/01/21 - 05/29/21 -she again discussed antiestrogen therapy with NP Lacie on 08/20/21. She agreed to start anastrozole after dealing with some hip pain. She tells me today she has still not started yet. She plans to get this filled and start soon. I encouraged her to start with full dose and discussed that we can decrease the dose if needed. -we reviewed additional screening with breast MRI, which is performed annually, 6 months apart from mammogram. She is interested. Plan to start in 2024 -she is clinically doing well. Labs reviewed, overall WNL. Physical exam was unremarkable, only some residual skin darkening from radiation.   2. Bone Health -baseline DEXA on 07/07/21 was borderline normal, with T-score of -1.0 at left femoral neck.  3. Left Hip pain -she completed medrol dosepak 08/2021 -started physical therapy, currently taking flexeril -followed by orthopedics     PLAN:  -she plan to start anastrozole -phone visit in 3 months to see  how she is tolerating anastrozole -lab and f/u in 6 months   No problem-specific Assessment & Plan notes found for this encounter.   SUMMARY OF ONCOLOGIC HISTORY: Oncology History Overview Note   Cancer Staging  Ductal carcinoma in situ (DCIS) of right breast Staging form: Breast, AJCC 8th Edition - Clinical stage from 03/12/2021: Stage 0 (cTis (DCIS), cN0, cM0, ER+, PR+) - Signed by Truitt Merle, MD on 03/12/2021 Stage prefix: Initial diagnosis Nuclear grade: G3 - Pathologic stage from 03/27/2021: Stage Unknown (pTis (DCIS), pNX, cM0, G3, ER+, PR+, HER2: Not Assessed) - Signed by Truitt Merle, MD on 05/23/2021 Stage prefix: Initial diagnosis Histologic grading system: 3 grade system Residual tumor (R): R0 - None     Ductal carcinoma in situ (DCIS) of right breast  02/12/2021 Mammogram   Exam: 3D Mammogram Diagnotic - Right  IMPRESSION: The 1.2 cm grouped fine linear calcifications in the right breast are suspicious.   02/26/2021 Initial Biopsy   Diagnosis Breast, right, needle core biopsy, 9 o'clock, 5.5 cmfn - DUCTAL CARCINOMA IN SITU WITH NECROSIS AND CALCIFICATIONS - SEE COMMENT Microscopic Comment Based on the biopsy, the ductal carcinoma in situ has a comedo pattern, high nuclear grade and measures 0.1 cm in greatest linear extent.  PROGNOSTIC INDICATORS Results: Estrogen Receptor: 90%, POSITIVE, MODERATE STAINING INTENSITY Progesterone Receptor: 40%, POSITIVE, STRONG-MODERATE STAINING INTENSITY   03/05/2021 Initial Diagnosis   Ductal carcinoma in situ (DCIS) of right breast   03/12/2021 Cancer Staging   Staging form: Breast, AJCC 8th Edition - Clinical stage from 03/12/2021: Stage 0 (cTis (DCIS), cN0, cM0, ER+, PR+) - Signed by Truitt Merle, MD on  03/12/2021 Stage prefix: Initial diagnosis Nuclear grade: G3   03/27/2021 Cancer Staging   Staging form: Breast, AJCC 8th Edition - Pathologic stage from 03/27/2021: Stage Unknown (pTis (DCIS), pNX, cM0, G3, ER+, PR+, HER2:  Not Assessed) - Signed by Truitt Merle, MD on 05/23/2021 Stage prefix: Initial diagnosis Histologic grading system: 3 grade system Residual tumor (R): R0 - None    Definitive Surgery   FINAL MICROSCOPIC DIAGNOSIS:   A. BREAST, RIGHT, LUMPECTOMY:  -  Ductal carcinoma in situ (DCIS), grade III (high), with central  ("comedo") necrosis and calcifications, 8 mm in greatest dimension.  -  Resected edges negative for DCIS, distance from DCIS to closest edge 7 mm, anterior.  -  Negative for invasive carcinoma.  -  Seed implants with foreign body reaction and inflammation.   B. BREAST, RIGHT NEW ANTERIOR MARGIN, EXCISION:  -  Benign breast tissue with mild cystic apocrine metaplasia.  -  Negative for DCIS and invasive carcinoma.    05/01/2021 - 05/29/2021 Radiation Therapy    Site Technique Total Dose (Gy) Dose per Fx (Gy) Completed Fx Beam Energies  Breast, Right: Breast_R 3D 40.05/40.05 2.67 15/15 6XFFF, 10XFFF  Breast, Right: Breast_R_Bst 3D 10/10 2 5/5 6X, 10X    08/20/2021 Survivorship   SCP delivered by Cira Rue, NP      INTERVAL HISTORY:  Natalie Rice is here for a follow up of DCIS. She was last seen by me on 05/23/21 with survivorship in the interim. She presents to the clinic alone. She reports she is doing well overall. She tells me she has been dealing with hip/low back pain on the left side, sometimes radiating down her leg. She notes she has been doing PT.   All other systems were reviewed with the patient and are negative.  MEDICAL HISTORY:  Past Medical History:  Diagnosis Date   Breast cancer ([place])    Colon polyp    Diabetes mellitus without complication ([place])    Eosinophilia 03/03/2012   Hay fever    High blood pressure    High cholesterol    History of radiation therapy    right breast 05/01/2021-05/29/2021  Dr Gery Pray   Hyperlipidemia    Sleep apnea     SURGICAL HISTORY: Past Surgical History:  Procedure Laterality Date   BREAST LUMPECTOMY WITH  RADIOACTIVE SEED LOCALIZATION Right 03/27/2021   Procedure: RIGHT BREAST LUMPECTOMY WITH RADIOACTIVE SEED LOCALIZATION;  Surgeon: Coralie Keens, MD;  Location: [facility];  Service: General;  Laterality: Right;   TUBAL LIGATION      I have reviewed the social history and family history with the patient and they are unchanged from previous note.  ALLERGIES:  has No Known Allergies.  MEDICATIONS:  Current Outpatient Medications  Medication Sig Dispense Refill   anastrozole (ARIMIDEX) 1 MG tablet Take 1 tablet (1 mg total) by mouth daily. (Patient not taking: Reported on 09/23/2021) 30 tablet 3   Apoaequorin (PREVAGEN PO) Take 1 tablet by mouth in the morning.     atorvastatin (LIPITOR) 10 MG tablet TAKE 1 TABLET BY MOUTH DAILY 90 tablet 3   Cholecalciferol (VITAMIN D-3) 125 MCG (5000 UT) TABS Take 5,000 Units by mouth in the morning.     Coenzyme Q10 (COQ10) 200 MG CAPS Take 200 mg by mouth in the morning.     Loratadine 10 MG CAPS Take 10 mg by mouth daily as needed (seasonal allergies.).     losartan-hydrochlorothiazide (HYZAAR) 50-12.5 MG tablet TAKE 1 TABLET BY MOUTH  DAILY 90 tablet 3   Magnesium 250 MG TABS Take 250 mg by mouth in the morning.     Omega-3 Fatty Acids (OMEGA 3 PO) Take 1 capsule by mouth daily.     Potassium 99 MG TABS Take 99 mg by mouth in the morning.     No current facility-administered medications for this visit.    PHYSICAL EXAMINATION: ECOG PERFORMANCE STATUS: 1 - Symptomatic but completely ambulatory  Vitals:   11/20/21 1206  BP: (!) 147/75  Pulse: 77  Resp: 18  Temp: 98.4 F (36.9 C)  SpO2: 100%   Wt Readings from Last 3 Encounters:  11/20/21 181 lb 6.4 oz (82.3 kg)  09/23/21 182 lb 8 oz (82.8 kg)  08/20/21 184 lb 12.8 oz (83.8 kg)     GENERAL:alert, no distress and comfortable SKIN: skin color, texture, turgor are normal, no rashes or significant lesions EYES: normal, Conjunctiva are pink and non-injected, sclera clear  NECK: supple, thyroid  normal size, non-tender, without nodularity LYMPH:  no palpable lymphadenopathy in the cervical, axillary LUNGS: clear to auscultation and percussion with normal breathing effort HEART: regular rate & rhythm and no murmurs and no lower extremity edema ABDOMEN:abdomen soft, non-tender and normal bowel sounds Musculoskeletal:no cyanosis of digits and no clubbing  NEURO: alert & oriented x 3 with fluent speech, no focal motor/sensory deficits BREAST: No palpable mass, nodules or adenopathy bilaterally. Breast exam benign.   LABORATORY DATA:  I have reviewed the data as listed    Latest Ref Rng & Units 11/20/2021   11:44 AM 03/12/2021    9:23 AM 09/15/2012   12:29 PM  CBC  WBC 4.0 - 10.5 K/uL 6.0  7.4  9.3   Hemoglobin 12.0 - 15.0 g/dL 14.6  15.1  14.0   Hematocrit 36.0 - 46.0 % 43.1  44.9  42.6   Platelets 150 - 400 K/uL 275  331  336.0         Latest Ref Rng & Units 11/20/2021   11:44 AM 09/15/2021    8:57 AM 03/12/2021    9:23 AM  CMP  Glucose 70 - 99 mg/dL 97  121  111   BUN 8 - 23 mg/dL '14  9  10   ' Creatinine 0.44 - 1.00 mg/dL 0.88  0.86  0.92   Sodium 135 - 145 mmol/L 140  141  139   Potassium 3.5 - 5.1 mmol/L 3.7  3.8  3.5   Chloride 98 - 111 mmol/L 104  102  103   CO2 22 - 32 mmol/L '31  29  26   ' Calcium 8.9 - 10.3 mg/dL 10.3  10.1  10.1   Total Protein 6.5 - 8.1 g/dL 7.3  6.6  7.5   Total Bilirubin 0.3 - 1.2 mg/dL 0.6  0.7  0.7   Alkaline Phos 38 - 126 U/L 84  77  65   AST 15 - 41 U/L '21  20  18   ' ALT 0 - 44 U/L '29  31  24       ' RADIOGRAPHIC STUDIES: I have personally reviewed the radiological images as listed and agreed with the findings in the report. No results found.    No orders of the defined types were placed in this encounter.  All questions were answered. The patient knows to call the clinic with any problems, questions or concerns. No barriers to learning was detected. The total time spent in the appointment was 20 minutes.     Truitt Merle,  MD 11/20/2021    I, Wilburn Mylar, am acting as scribe for Truitt Merle, MD.   I have reviewed the above documentation for accuracy and completeness, and I agree with the above.

## 2021-11-27 ENCOUNTER — Ambulatory Visit (AMBULATORY_SURGERY_CENTER): Payer: Self-pay

## 2021-11-27 VITALS — Ht 59.0 in | Wt 180.0 lb

## 2021-11-27 DIAGNOSIS — Z1211 Encounter for screening for malignant neoplasm of colon: Secondary | ICD-10-CM

## 2021-11-27 MED ORDER — NA SULFATE-K SULFATE-MG SULF 17.5-3.13-1.6 GM/177ML PO SOLN: 1.0000 | Freq: Once | ORAL | 0 refills | Status: AC

## 2021-11-27 NOTE — Progress Notes (Signed)
No egg or soy allergy known to patient  No issues known to pt with past sedation with any surgeries or procedures Patient denies ever being told they had issues or difficulty with intubation  No FH of Malignant Hyperthermia Pt is not on diet pills Pt is not on  home 02  Pt is not on blood thinners  Pt denies issues with constipation  No A fib or A flutter Have any cardiac testing pending--no Pt instructed to use Singlecare.[email or web address] or GoodRx for a price reduction on prep   

## 2021-12-02 ENCOUNTER — Encounter: Payer: Self-pay | Admitting: Gastroenterology

## 2021-12-18 ENCOUNTER — Ambulatory Visit (AMBULATORY_SURGERY_CENTER): Payer: Medicare Other | Admitting: Gastroenterology

## 2021-12-18 ENCOUNTER — Encounter: Payer: Self-pay | Admitting: Gastroenterology

## 2021-12-18 VITALS — BP 132/78 | HR 76 | Temp 96.0°F | Resp 15 | Ht 59.0 in | Wt 180.0 lb

## 2021-12-18 DIAGNOSIS — K640 First degree hemorrhoids: Secondary | ICD-10-CM

## 2021-12-18 DIAGNOSIS — D125 Benign neoplasm of sigmoid colon: Secondary | ICD-10-CM

## 2021-12-18 DIAGNOSIS — D124 Benign neoplasm of descending colon: Secondary | ICD-10-CM

## 2021-12-18 DIAGNOSIS — Z1211 Encounter for screening for malignant neoplasm of colon: Secondary | ICD-10-CM | POA: Diagnosis not present

## 2021-12-18 MED ORDER — SODIUM CHLORIDE 0.9 % IV SOLN: 500.0000 mL | Freq: Once | INTRAVENOUS | Status: DC

## 2021-12-18 NOTE — Progress Notes (Signed)
VS completed by DT.  Pt's states no medical or surgical changes since previsit or office visit.  

## 2021-12-18 NOTE — Progress Notes (Signed)
GASTROENTEROLOGY PROCEDURE H&P NOTE   Primary Care Physician: Jinny Sanders, MD    Reason for Procedure:  Colon Cancer screening  Plan:    Colonoscopy  Patient is appropriate for endoscopic procedure(s) in the ambulatory ([facility]) setting.  The nature of the procedure, as well as the risks, benefits, and alternatives were carefully and thoroughly reviewed with the patient. Ample time for discussion and questions allowed. The patient understood, was satisfied, and agreed to proceed.     HPI: Natalie Rice is a 66 y.o. female who presents for colonoscopy for routine Colon Cancer screening.  No active GI symptoms.  No known family history of colon cancer or related malignancy.  Patient is otherwise without complaints or active issues today.  Past Medical History:  Diagnosis Date   Allergy    seasonal   Breast cancer ([facility]) 03/27/2021   right side lumpectemy   Colon polyp    Diabetes mellitus without complication ([place])    Eosinophilia 03/03/2012   Hay fever    High blood pressure    High cholesterol    History of radiation therapy    right breast 05/01/2021-05/29/2021  Dr Gery Pray   Hyperlipidemia    Sleep apnea    uses mouthguard    Past Surgical History:  Procedure Laterality Date   BREAST LUMPECTOMY WITH RADIOACTIVE SEED LOCALIZATION Right 03/27/2021   Procedure: RIGHT BREAST LUMPECTOMY WITH RADIOACTIVE SEED LOCALIZATION;  Surgeon: Coralie Keens, MD;  Location: [facility];  Service: General;  Laterality: Right;   COLONOSCOPY  2012   no polyps   TUBAL LIGATION      Prior to Admission medications   Medication Sig Start Date End Date Taking? Authorizing Provider  Apoaequorin (PREVAGEN PO) Take 1 tablet by mouth in the morning.   Yes [provider]  atorvastatin (LIPITOR) 10 MG tablet TAKE 1 TABLET BY MOUTH DAILY 11/05/21  Yes Bedsole, Amy E, MD  Cholecalciferol (VITAMIN D-3) 125 MCG (5000 UT) TABS Take 5,000 Units by mouth in the morning.   Yes [provider]  Coenzyme Q10 (COQ10) 200 MG CAPS Take 200 mg by mouth in the morning.   Yes [provider]  Loratadine 10 MG CAPS Take 10 mg by mouth daily as needed (seasonal allergies.).   Yes [provider]  losartan-hydrochlorothiazide (HYZAAR) 50-12.5 MG tablet TAKE 1 TABLET BY MOUTH DAILY 11/05/21  Yes Bedsole, Amy E, MD  Magnesium 250 MG TABS Take 250 mg by mouth in the morning.   Yes [provider]  Omega-3 Fatty Acids (OMEGA 3 PO) Take 1 capsule by mouth daily.   Yes [provider]  Potassium 99 MG TABS Take 99 mg by mouth in the morning.   Yes [provider]  anastrozole (ARIMIDEX) 1 MG tablet Take 1 tablet (1 mg total) by mouth daily. 08/20/21   Alla Feeling, NP    Current Outpatient Medications  Medication Sig Dispense Refill   Apoaequorin (PREVAGEN PO) Take 1 tablet by mouth in the morning.     atorvastatin (LIPITOR) 10 MG tablet TAKE 1 TABLET BY MOUTH DAILY 90 tablet 3   Cholecalciferol (VITAMIN D-3) 125 MCG (5000 UT) TABS Take 5,000 Units by mouth in the morning.     Coenzyme Q10 (COQ10) 200 MG CAPS Take 200 mg by mouth in the morning.     Loratadine 10 MG CAPS Take 10 mg by mouth daily as needed (seasonal allergies.).     losartan-hydrochlorothiazide (HYZAAR) 50-12.5 MG tablet TAKE  1 TABLET BY MOUTH DAILY 90 tablet 3   Magnesium 250 MG TABS Take 250 mg by mouth in the morning.     Omega-3 Fatty Acids (OMEGA 3 PO) Take 1 capsule by mouth daily.     Potassium 99 MG TABS Take 99 mg by mouth in the morning.     anastrozole (ARIMIDEX) 1 MG tablet Take 1 tablet (1 mg total) by mouth daily. 30 tablet 3   Current Facility-Administered Medications  Medication Dose Route Frequency Provider Last Rate Last Admin   0.9 %  sodium chloride infusion  500 mL Intravenous Once Elisa Kutner V, DO        Allergies as of 12/18/2021   (No Known Allergies)    Family History  Problem Relation Age of Onset   Cancer Mother 61        cervical cancer   Heart disease Father 59       MI   Heart disease Brother 53       MI   Diabetes Maternal Grandmother    Diabetes Maternal Grandfather    Colon cancer Neg Hx    Colon polyps Neg Hx    Esophageal cancer Neg Hx    Rectal cancer Neg Hx    Stomach cancer Neg Hx     Social History   Socioeconomic History   Marital status: Married    Spouse name: Not on file   Number of children: 0   Years of education: Not on file   Highest education level: Not on file  Occupational History    Employer: [employer]    Comment: [employer]  Tobacco Use   Smoking status: Former    Packs/day: 1.00    Years: 3.00    Total pack years: 3.00    Types: Cigarettes    Quit date: 03/16/2005    Years since quitting: 16.7   Smokeless tobacco: Never   Tobacco comments:    quit 2007  Vaping Use   Vaping Use: Never used  Substance and Sexual Activity   Alcohol use: Yes    Comment: occassionally, every few months   Drug use: Never   Sexual activity: Not Currently    Birth control/protection: Post-menopausal, Surgical  Other Topics Concern   Not on file  Social History Narrative   Not on file   Social Determinants of Health   Financial Resource Strain: Not on file  Food Insecurity: No Food Insecurity (03/12/2021)   Hunger Vital Sign    Worried About Running Out of Food in the Last Year: Never true    Ran Out of Food in the Last Year: Never true  Transportation Needs: No Transportation Needs (03/12/2021)   PRAPARE - Hydrologist (Medical): No    Lack of Transportation (Non-Medical): No  Physical Activity: Not on file  Stress: Not on file  Social Connections: Not on file  Intimate Partner Violence: Not on file    Physical Exam: Vital signs in last 24 hours: '@BP'$  (!) 143/74   Pulse 75   Temp (!) 96 F (35.6 C) (Temporal)   Ht '4\' 11"'$  (1.499 m)   Wt 180 lb (81.6 kg)   LMP 12/26/2011   SpO2 98%   BMI 36.36 kg/m  GEN: NAD EYE: Sclerae  anicteric ENT: MMM CV: Non-tachycardic Pulm: CTA b/l GI: Soft, NT/ND NEURO:  Alert & Oriented x 3   Gerrit Heck, DO [facility]   12/18/2021 9:21 AM

## 2021-12-18 NOTE — Patient Instructions (Signed)
Handouts provided about hemorrhoids and polyps.  Resume previous diet.  Continue present medications. Await pathology results.  Repeat colonoscopy for surveillance based on pathology results.    YOU HAD AN ENDOSCOPIC PROCEDURE TODAY AT [facility]:   Refer to the procedure report that was given to you for any specific questions about what was found during the examination.  If the procedure report does not answer your questions, please call your gastroenterologist to clarify.  If you requested that your care partner not be given the details of your procedure findings, then the procedure report has been included in a sealed envelope for you to review at your convenience later.  YOU SHOULD EXPECT: Some feelings of bloating in the abdomen. Passage of more gas than usual.  Walking can help get rid of the air that was put into your GI tract during the procedure and reduce the bloating. If you had a lower endoscopy (such as a colonoscopy or flexible sigmoidoscopy) you may notice spotting of blood in your stool or on the toilet paper. If you underwent a bowel prep for your procedure, you may not have a normal bowel movement for a few days.  Please Note:  You might notice some irritation and congestion in your nose or some drainage.  This is from the oxygen used during your procedure.  There is no need for concern and it should clear up in a day or so.  SYMPTOMS TO REPORT IMMEDIATELY:  Following lower endoscopy (colonoscopy or flexible sigmoidoscopy):  Excessive amounts of blood in the stool  Significant tenderness or worsening of abdominal pains  Swelling of the abdomen that is new, acute  Fever of 100F or higher   For urgent or emergent issues, a gastroenterologist can be reached at any hour by calling [phone]. Do not use MyChart messaging for urgent concerns.    DIET:  We do recommend a small meal at first, but then you may proceed to your regular diet.  Drink plenty of  fluids but you should avoid alcoholic beverages for 24 hours.  ACTIVITY:  You should plan to take it easy for the rest of today and you should NOT DRIVE or use heavy machinery until tomorrow (because of the sedation medicines used during the test).    FOLLOW UP: Our staff will call the number listed on your records the next business day following your procedure.  We will call around 7:15- 8:00 am to check on you and address any questions or concerns that you may have regarding the information given to you following your procedure. If we do not reach you, we will leave a message.     If any biopsies were taken you will be contacted by phone or by letter within the next 1-3 weeks.  Please call us at [phone] if you have not heard about the biopsies in 3 weeks.    SIGNATURES/CONFIDENTIALITY: You and/or your care partner have signed paperwork which will be entered into your electronic medical record.  These signatures attest to the fact that that the information above on your After Visit Summary has been reviewed and is understood.  Full responsibility of the confidentiality of this discharge information lies with you and/or your care-partner.

## 2021-12-18 NOTE — Progress Notes (Signed)
PT taken to PACU. Monitors in place. VSS. Report given to RN. 

## 2021-12-18 NOTE — Op Note (Signed)
[name] Procedure Date: 12/18/2021 9:25 AM MRN: [id number] Endoscopist: Gerrit Heck , MD Age: 66 Referring MD:  Date of Birth: [birth year] Gender: Female Account #: [id number] Procedure:                Colonoscopy Indications:              Screening for colorectal malignant neoplasm (last                            colonoscopy was more than 10 years ago) Medicines:                Monitored Anesthesia Care Procedure:                Pre-Anesthesia Assessment:                           - Prior to the procedure, a History and Physical                            was performed, and patient medications and                            allergies were reviewed. The patient's tolerance of                            previous anesthesia was also reviewed. The risks                            and benefits of the procedure and the sedation                            options and risks were discussed with the patient.                            All questions were answered, and informed consent                            was obtained. Prior Anticoagulants: The patient has                            taken no previous anticoagulant or antiplatelet                            agents. ASA Grade Assessment: II - A patient with                            mild systemic disease. After reviewing the risks                            and benefits, the patient was deemed in                            satisfactory condition to undergo the procedure.  After obtaining informed consent, the colonoscope                            was passed under direct vision. Throughout the                            procedure, the patient's blood pressure, pulse, and                            oxygen saturations were monitored continuously. The                            CF [id number] #[id number] was introduced through the anus                            and advanced to  the the terminal ileum. The                            colonoscopy was performed without difficulty. The                            patient tolerated the procedure well. The quality                            of the bowel preparation was good. The terminal                            ileum, ileocecal valve, appendiceal orifice, and                            rectum were photographed. Scope In: 9:31:42 AM Scope Out: 7:59:16 AM Scope Withdrawal Time: 0 hours 11 minutes 36 seconds  Total Procedure Duration: 0 hours 15 minutes 5 seconds  Findings:                 The perianal and digital rectal examinations were                            normal.                           Two sessile polyps were found in the sigmoid colon                            and descending colon. The polyps were 3 to 4 mm in                            size. These polyps were removed with a cold snare.                            Resection and retrieval were complete. Estimated                            blood loss was minimal.  The exam was otherwise normal throughout the                            remainder of the colon.                           Non-bleeding internal hemorrhoids were found during                            retroflexion. The hemorrhoids were small.                           The terminal ileum appeared normal. Complications:            No immediate complications. Estimated Blood Loss:     Estimated blood loss was minimal. Impression:               - Two 3 to 4 mm polyps in the sigmoid colon and in                            the descending colon, removed with a cold snare.                            Resected and retrieved.                           - Non-bleeding internal hemorrhoids.                           - The examined portion of the ileum was normal. Recommendation:           - Patient has a contact number available for                            emergencies. The signs  and symptoms of potential                            delayed complications were discussed with the                            patient. Return to normal activities tomorrow.                            Written discharge instructions were provided to the                            patient.                           - Resume previous diet.                           - Continue present medications.                           - Await pathology results.                           -  Repeat colonoscopy for surveillance based on                            pathology results.                           - Return to GI office PRN. Gerrit Heck, MD 12/18/2021 9:52:26 AM

## 2021-12-19 ENCOUNTER — Telehealth: Payer: Self-pay | Admitting: *Deleted

## 2021-12-19 NOTE — Telephone Encounter (Signed)
Follow up call attempt.  LVM to call if any questions or concerns. 

## 2021-12-22 ENCOUNTER — Ambulatory Visit: Payer: Medicare Other | Admitting: Hematology

## 2021-12-22 ENCOUNTER — Other Ambulatory Visit: Payer: Medicare Other

## 2021-12-24 ENCOUNTER — Encounter: Payer: Self-pay | Admitting: Gastroenterology

## 2022-01-16 ENCOUNTER — Telehealth: Payer: Self-pay | Admitting: Family Medicine

## 2022-01-16 DIAGNOSIS — E1169 Type 2 diabetes mellitus with other specified complication: Secondary | ICD-10-CM

## 2022-01-16 MED ORDER — ATORVASTATIN CALCIUM 10 MG PO TABS: 10.0000 mg | ORAL_TABLET | Freq: Every day | ORAL | 1 refills | Status: DC

## 2022-01-16 NOTE — Telephone Encounter (Signed)
Caller Name: Mavis  Call back phone #: [phone]  MEDICATION(S):  atorvastatin (LIPITOR) 10 MG tablet   Days of Med Remaining:   Has the patient contacted their pharmacy (YES/NO)? NO What did pharmacy advise?   Preferred Pharmacy:  [facility]   ~~~Please advise patient/caregiver to allow 2-3 business days to process RX refills.   Pt is requesting 20 mg to be refilled instead of '10mg'$ . Pt stated Dr. Diona Browner & herself discussed the increase of the dosage & pt has been taking '20mg'$  for over month.

## 2022-01-16 NOTE — Telephone Encounter (Signed)
Refill sent as requested. 

## 2022-01-21 MED ORDER — ATORVASTATIN CALCIUM 20 MG PO TABS: 20.0000 mg | ORAL_TABLET | Freq: Every day | ORAL | 0 refills | Status: DC

## 2022-01-21 MED ORDER — ATORVASTATIN CALCIUM 20 MG PO TABS: 20.0000 mg | ORAL_TABLET | Freq: Every day | ORAL | 3 refills | Status: DC

## 2022-01-21 NOTE — Addendum Note (Signed)
Addended by: [name] on: 01/21/2022 12:28 PM   Modules accepted: Orders

## 2022-01-21 NOTE — Telephone Encounter (Signed)
Patient called in and stated that Dr. Diona Browner wanted her to take '20mg'$  of this medication and she is running out already. She stated she is going out of tomorrow and was wondering if '20mg'$  could be sent over to her pharmacy today so she can pick it up. She just enough until her mail order delivery come in, which is about 4-5 days worth. She stated it can be sent over to  [id number] [place] [place] . Thank you!

## 2022-01-21 NOTE — Telephone Encounter (Signed)
Atorvastatin 20 mg prescription for 30 day supply [facility] [place] and then I sent #90 with 3 refills to [facility].  Natalie Rice notified of this via telephone.

## 2022-01-28 ENCOUNTER — Telehealth: Payer: Self-pay | Admitting: Family Medicine

## 2022-01-28 NOTE — Telephone Encounter (Signed)
  Encourage patient to contact the pharmacy for refills or they can request refills through [facility]  Did the patient contact the pharmacy: No  LAST APPOINTMENT DATE:  09/23/2021  NEXT APPOINTMENT DATE: 03/27/2022  MEDICATION: clotrimazole-betamethasone (LOTRISONE) cream   Is the patient out of medication? Yes  PHARMACY: [id number] [place], [place] - [address]   Let patient know to contact pharmacy at the end of the day to make sure medication is ready.  Please notify patient to allow 48-72 hours to process

## 2022-01-28 NOTE — Telephone Encounter (Signed)
Last office visit 09/23/21 for Welcome to Medicare.  Last refilled:  Not on currently medication list. Next Appt: 03/27/2022 for DM.

## 2022-01-29 MED ORDER — CLOTRIMAZOLE-BETAMETHASONE 1-0.05 % EX CREA: 1.0000 | TOPICAL_CREAM | Freq: Every day | CUTANEOUS | 0 refills | Status: DC

## 2022-01-29 NOTE — Telephone Encounter (Signed)
Patient called back and stated she gets a breakout under her breast that's a form of a rash and the creams help with it.

## 2022-01-29 NOTE — Telephone Encounter (Signed)
Call patient What is she using this for?

## 2022-01-29 NOTE — Addendum Note (Signed)
Addended byEliezer Lofts E on: 01/29/2022 01:09 PM   Modules accepted: Orders

## 2022-01-29 NOTE — Telephone Encounter (Signed)
Left message for Natalie Rice to return call to office and let Dr. Diona Browner know what she is using the Lotrisone cream for.

## 2022-01-29 NOTE — Telephone Encounter (Signed)
Sent in prescription as requested.

## 2022-02-19 ENCOUNTER — Inpatient Hospital Stay: Payer: Medicare Other | Attending: Hematology | Admitting: Hematology

## 2022-02-19 ENCOUNTER — Encounter: Payer: Self-pay | Admitting: Hematology

## 2022-02-19 DIAGNOSIS — D0511 Intraductal carcinoma in situ of right breast: Secondary | ICD-10-CM

## 2022-02-19 NOTE — Assessment & Plan Note (Signed)
-  diagnosed in 01/2021.  -S/p right lumpectomy on 03/27/21 by Dr. [name] again showed high grade DCIS, 8 mm, no invasive carcinoma, margins negative. -s/p radiation under Dr. Sondra Come, 05/01/21 - 05/29/21 -on adjuvant anastrozole now

## 2022-02-19 NOTE — Progress Notes (Signed)
[place]   [phone] [phone]   Clinic Follow up Note   Patient Care Team: Jinny Sanders, MD as PCP - General (Family Medicine) Rockwell Germany, RN as Oncology Nurse Navigator Mauro Kaufmann, RN as Oncology Nurse Navigator Truitt Merle, MD as Consulting Physician (Medical Oncology) Gery Pray, MD as Consulting Physician (Radiation Oncology) Coralie Keens, MD as Consulting Physician (General Surgery) Alla Feeling, NP as Nurse Practitioner (Nurse Practitioner)  Date of Service:  02/19/2022  I connected with Natalie Rice on 02/19/2022 at  1:40 PM EST by telephone visit and verified that I am speaking with the correct person using two identifiers.  I discussed the limitations, risks, security and privacy concerns of performing an evaluation and management service by telephone and the availability of in person appointments. I also discussed with the patient that there may be a patient responsible charge related to this service. The patient expressed understanding and agreed to proceed.   Other persons participating in the visit and their role in the encounter:    Patient's location:  home Provider's location:  Office  CHIEF COMPLAINT: f/u of right breast DCIS   CURRENT THERAPY:  To start anastrozole, 11/2021   ASSESSMENT & PLAN:  Natalie Rice is a 66 y.o. female with    1. Right breast DCIS, grade 3, ER+/PR+ -found on screening mammogram on 01/23/21. S/p right lumpectomy on 03/27/21 by Dr. [name] again showed high grade DCIS, 8 mm, no invasive carcinoma, margins negative. -s/p radiation under Dr. Sondra Come, 05/01/21 - 05/29/21 -she finally started adjuvant anastrozole in November 2023, she has been tolerating well with moderate of hot flashes, overall still manageable. -We again reviewed the potential side effect of anastrozole, she will continue monitoring.   2. Bone Health -baseline DEXA on 07/07/21 was borderline normal, with  T-score of -1.0 at left femoral neck.     PLAN: - Encourage the pt to stay active to reduce joint pain. -Continue Anastrozole, she has moderate hot flashes, otherwise tolerating well. -f/u in 3 mths as scheduled   Ductal carcinoma in situ (DCIS) of right breast -diagnosed in 01/2021.  -S/p right lumpectomy on 03/27/21 by Dr. [name] again showed high grade DCIS, 8 mm, no invasive carcinoma, margins negative. -s/p radiation under Dr. Sondra Come, 05/01/21 - 05/29/21 -on adjuvant anastrozole now    SUMMARY OF ONCOLOGIC HISTORY: Oncology History Overview Note   Cancer Staging  Ductal carcinoma in situ (DCIS) of right breast Staging form: Breast, AJCC 8th Edition - Clinical stage from 03/12/2021: Stage 0 (cTis (DCIS), cN0, cM0, ER+, PR+) - Signed by Truitt Merle, MD on 03/12/2021 Stage prefix: Initial diagnosis Nuclear grade: G3 - Pathologic stage from 03/27/2021: Stage Unknown (pTis (DCIS), pNX, cM0, G3, ER+, PR+, HER2: Not Assessed) - Signed by Truitt Merle, MD on 05/23/2021 Stage prefix: Initial diagnosis Histologic grading system: 3 grade system Residual tumor (R): R0 - None     Ductal carcinoma in situ (DCIS) of right breast  02/12/2021 Mammogram   Exam: 3D Mammogram Diagnotic - Right  IMPRESSION: The 1.2 cm grouped fine linear calcifications in the right breast are suspicious.   02/26/2021 Initial Biopsy   Diagnosis Breast, right, needle core biopsy, 9 o'clock, 5.5 cmfn - DUCTAL CARCINOMA IN SITU WITH NECROSIS AND CALCIFICATIONS - SEE COMMENT Microscopic Comment Based on the biopsy, the ductal carcinoma in situ has a comedo pattern, high nuclear grade and measures 0.1 cm in greatest linear extent.  PROGNOSTIC INDICATORS Results: Estrogen Receptor:  90%, POSITIVE, MODERATE STAINING INTENSITY Progesterone Receptor: 40%, POSITIVE, STRONG-MODERATE STAINING INTENSITY   03/05/2021 Initial Diagnosis   Ductal carcinoma in situ (DCIS) of right breast   03/12/2021 Cancer Staging    Staging form: Breast, AJCC 8th Edition - Clinical stage from 03/12/2021: Stage 0 (cTis (DCIS), cN0, cM0, ER+, PR+) - Signed by Truitt Merle, MD on 03/12/2021 Stage prefix: Initial diagnosis Nuclear grade: G3   03/27/2021 Cancer Staging   Staging form: Breast, AJCC 8th Edition - Pathologic stage from 03/27/2021: Stage Unknown (pTis (DCIS), pNX, cM0, G3, ER+, PR+, HER2: Not Assessed) - Signed by Truitt Merle, MD on 05/23/2021 Stage prefix: Initial diagnosis Histologic grading system: 3 grade system Residual tumor (R): R0 - None    Definitive Surgery   FINAL MICROSCOPIC DIAGNOSIS:   A. BREAST, RIGHT, LUMPECTOMY:  -  Ductal carcinoma in situ (DCIS), grade III (high), with central  ("comedo") necrosis and calcifications, 8 mm in greatest dimension.  -  Resected edges negative for DCIS, distance from DCIS to closest edge 7 mm, anterior.  -  Negative for invasive carcinoma.  -  Seed implants with foreign body reaction and inflammation.   B. BREAST, RIGHT NEW ANTERIOR MARGIN, EXCISION:  -  Benign breast tissue with mild cystic apocrine metaplasia.  -  Negative for DCIS and invasive carcinoma.    05/01/2021 - 05/29/2021 Radiation Therapy    Site Technique Total Dose (Gy) Dose per Fx (Gy) Completed Fx Beam Energies  Breast, Right: Breast_R 3D 40.05/40.05 2.67 15/15 6XFFF, 10XFFF  Breast, Right: Breast_R_Bst 3D 10/10 2 5/5 6X, 10X    08/20/2021 Survivorship   SCP delivered by Cira Rue, NP      INTERVAL HISTORY:  Natalie Rice was contacted for a follow up of  right breast DCIS . She was last seen by me on 11/20/2021 Pt reports that she has hot flashes.Moderate., since starting the Anastrozole, and is tolerable.   All other systems were reviewed with the patient and are negative.  MEDICAL HISTORY:  Past Medical History:  Diagnosis Date   Allergy    seasonal   Breast cancer ([place]) 03/27/2021   right side lumpectemy   Colon polyp    Diabetes mellitus without complication ([place])     Eosinophilia 03/03/2012   Hay fever    High blood pressure    High cholesterol    History of radiation therapy    right breast 05/01/2021-05/29/2021  Dr Gery Pray   Hyperlipidemia    Sleep apnea    uses mouthguard    SURGICAL HISTORY: Past Surgical History:  Procedure Laterality Date   BREAST LUMPECTOMY WITH RADIOACTIVE SEED LOCALIZATION Right 03/27/2021   Procedure: RIGHT BREAST LUMPECTOMY WITH RADIOACTIVE SEED LOCALIZATION;  Surgeon: Coralie Keens, MD;  Location: [facility];  Service: General;  Laterality: Right;   COLONOSCOPY  2012   no polyps   TUBAL LIGATION      I have reviewed the social history and family history with the patient and they are unchanged from previous note.  ALLERGIES:  has No Known Allergies.  MEDICATIONS:  Current Outpatient Medications  Medication Sig Dispense Refill   anastrozole (ARIMIDEX) 1 MG tablet Take 1 tablet (1 mg total) by mouth daily. 30 tablet 3   Apoaequorin (PREVAGEN PO) Take 1 tablet by mouth in the morning.     atorvastatin (LIPITOR) 20 MG tablet Take 1 tablet (20 mg total) by mouth daily. 90 tablet 3   Cholecalciferol (VITAMIN D-3) 125 MCG (5000 UT) TABS Take 5,000 Units  by mouth in the morning.     clotrimazole-betamethasone (LOTRISONE) cream Apply 1 Application topically daily. 30 g 0   Coenzyme Q10 (COQ10) 200 MG CAPS Take 200 mg by mouth in the morning.     Loratadine 10 MG CAPS Take 10 mg by mouth daily as needed (seasonal allergies.).     losartan-hydrochlorothiazide (HYZAAR) 50-12.5 MG tablet TAKE 1 TABLET BY MOUTH DAILY 90 tablet 3   Magnesium 250 MG TABS Take 250 mg by mouth in the morning.     Omega-3 Fatty Acids (OMEGA 3 PO) Take 1 capsule by mouth daily.     Potassium 99 MG TABS Take 99 mg by mouth in the morning.     No current facility-administered medications for this visit.    PHYSICAL EXAMINATION: ECOG PERFORMANCE STATUS: 0 - Asymptomatic  There were no vitals filed for this visit. Wt Readings from Last 3  Encounters:  12/18/21 180 lb (81.6 kg)  11/27/21 180 lb (81.6 kg)  11/20/21 181 lb 6.4 oz (82.3 kg)     No vitals taken today, Exam not performed today  LABORATORY DATA:  I have reviewed the data as listed    Latest Ref Rng & Units 11/20/2021   11:44 AM 03/12/2021    9:23 AM 09/15/2012   12:29 PM  CBC  WBC 4.0 - 10.5 K/uL 6.0  7.4  9.3   Hemoglobin 12.0 - 15.0 g/dL 14.6  15.1  14.0   Hematocrit 36.0 - 46.0 % 43.1  44.9  42.6   Platelets 150 - 400 K/uL 275  331  336.0         Latest Ref Rng & Units 11/20/2021   11:44 AM 09/15/2021    8:57 AM 03/12/2021    9:23 AM  CMP  Glucose 70 - 99 mg/dL 97  121  111   BUN 8 - 23 mg/dL _0 Creatinine 0.44 - 1.00 mg/dL 0.88  0.86  0.92   Sodium 135 - 145 mmol/L 140  141  139   Potassium 3.5 - 5.1 mmol/L 3.7  3.8  3.5   Chloride 98 - 111 mmol/L 104  102  103   CO2 22 - 32 mmol/L _1 Calcium 8.9 - 10.3 mg/dL 10.3  10.1  10.1   Total Protein 6.5 - 8.1 g/dL 7.3  6.6  7.5   Total Bilirubin 0.3 - 1.2 mg/dL 0.6  0.7  0.7   Alkaline Phos 38 - 126 U/L 84  77  65   AST 15 - 41 U/L _2 ALT 0 - 44 U/L _3 RADIOGRAPHIC STUDIES: I have personally reviewed the radiological images as listed and agreed with the findings in the report. No results found.    No orders of the defined types were placed in this encounter.  All questions were answered. The patient knows to call the clinic with any problems, questions or concerns. No barriers to learning was detected. The total time spent in the appointment was 15 minutes.     Truitt Merle, MD 02/19/2022   Felicity Coyer am acting as scribe for Truitt Merle, MD.   I have reviewed the above documentation for accuracy and completeness, and I agree with the above.

## 2022-03-17 ENCOUNTER — Telehealth: Payer: Self-pay | Admitting: Family Medicine

## 2022-03-17 DIAGNOSIS — E119 Type 2 diabetes mellitus without complications: Secondary | ICD-10-CM

## 2022-03-17 DIAGNOSIS — E1169 Type 2 diabetes mellitus with other specified complication: Secondary | ICD-10-CM

## 2022-03-17 NOTE — Telephone Encounter (Signed)
-----   Message from Velna Hatchet, RT sent at 03/02/2022 11:35 AM EST ----- Regarding: Thu 1/4 lab Patient is scheduled for cpx, please order future labs.  Thanks, Anda Kraft

## 2022-03-19 ENCOUNTER — Other Ambulatory Visit: Payer: Medicare Other

## 2022-03-24 ENCOUNTER — Telehealth: Payer: Self-pay | Admitting: Family Medicine

## 2022-03-24 DIAGNOSIS — E119 Type 2 diabetes mellitus without complications: Secondary | ICD-10-CM

## 2022-03-24 NOTE — Telephone Encounter (Signed)
-----   Message from Velna Hatchet, RT sent at 03/17/2022 12:35 PM EST ----- Regarding: Thu 1/18 lab Lab orders needed for a 6 month follow up appt, please.  Thanks, Anda Kraft

## 2022-03-27 ENCOUNTER — Ambulatory Visit: Payer: Medicare Other | Admitting: Family Medicine

## 2022-04-02 ENCOUNTER — Other Ambulatory Visit (INDEPENDENT_AMBULATORY_CARE_PROVIDER_SITE_OTHER): Payer: Medicare Other

## 2022-04-02 ENCOUNTER — Other Ambulatory Visit: Payer: Self-pay | Admitting: Nurse Practitioner

## 2022-04-02 DIAGNOSIS — E119 Type 2 diabetes mellitus without complications: Secondary | ICD-10-CM

## 2022-04-02 LAB — COMPREHENSIVE METABOLIC PANEL
ALT: 25 U/L (ref 0–35)
AST: 18 U/L (ref 0–37)
Albumin: 4.3 g/dL (ref 3.5–5.2)
Alkaline Phosphatase: 104 U/L (ref 39–117)
BUN: 15 mg/dL (ref 6–23)
CO2: 30 mEq/L (ref 19–32)
Calcium: 10.1 mg/dL (ref 8.4–10.5)
Chloride: 102 mEq/L (ref 96–112)
Creatinine, Ser: 0.89 mg/dL (ref 0.40–1.20)
GFR: 67.91 mL/min (ref >60.00)
Glucose, Bld: 121 mg/dL — ABNORMAL HIGH (ref 70–99)
Potassium: 4 mEq/L (ref 3.5–5.1)
Sodium: 140 mEq/L (ref 135–145)
Total Bilirubin: 0.5 mg/dL (ref 0.2–1.2)
Total Protein: 7.1 g/dL (ref 6.0–8.3)

## 2022-04-02 LAB — LIPID PANEL
Cholesterol: 233 mg/dL — ABNORMAL HIGH (ref 0–200)
HDL: 55.3 mg/dL (ref >39.00)
LDL Cholesterol: 148 mg/dL — ABNORMAL HIGH (ref 0–99)
NonHDL: 177.85
Total CHOL/HDL Ratio: 4
Triglycerides: 151 mg/dL — ABNORMAL HIGH (ref 0.0–149.0)
VLDL: 30.2 mg/dL (ref 0.0–40.0)

## 2022-04-02 LAB — HEMOGLOBIN A1C: Hgb A1c MFr Bld: 6.7 % — ABNORMAL HIGH (ref 4.6–6.5)

## 2022-04-02 LAB — MICROALBUMIN / CREATININE URINE RATIO
Creatinine,U: 191.1 mg/dL
Microalb Creat Ratio: 0.6 mg/g (ref 0.0–30.0)
Microalb, Ur: 1.1 mg/dL (ref 0.0–1.9)

## 2022-04-02 NOTE — Progress Notes (Signed)
No critical labs need to be addressed urgently. We will discuss labs in detail at upcoming office visit.   

## 2022-04-10 ENCOUNTER — Encounter: Payer: Self-pay | Admitting: Family Medicine

## 2022-04-10 ENCOUNTER — Ambulatory Visit (INDEPENDENT_AMBULATORY_CARE_PROVIDER_SITE_OTHER): Payer: Medicare Other | Admitting: Family Medicine

## 2022-04-10 VITALS — BP 150/78 | HR 67 | Temp 98.6°F | Ht 59.25 in | Wt 181.4 lb

## 2022-04-10 DIAGNOSIS — I152 Hypertension secondary to endocrine disorders: Secondary | ICD-10-CM | POA: Diagnosis not present

## 2022-04-10 DIAGNOSIS — E119 Type 2 diabetes mellitus without complications: Secondary | ICD-10-CM

## 2022-04-10 DIAGNOSIS — E1159 Type 2 diabetes mellitus with other circulatory complications: Secondary | ICD-10-CM

## 2022-04-10 DIAGNOSIS — E1169 Type 2 diabetes mellitus with other specified complication: Secondary | ICD-10-CM | POA: Diagnosis not present

## 2022-04-10 DIAGNOSIS — E785 Hyperlipidemia, unspecified: Secondary | ICD-10-CM | POA: Diagnosis not present

## 2022-04-10 MED ORDER — OZEMPIC (0.25 OR 0.5 MG/DOSE) 2 MG/3ML ~~LOC~~ SOPN: 0.2500 mg | PEN_INJECTOR | SUBCUTANEOUS | 11 refills | Status: DC

## 2022-04-10 NOTE — Assessment & Plan Note (Addendum)
Chronic, inadequate control... at last OV increased atorvastatin to 20 mg daily. Will start with  weight management and diet change.. consider increase to 40 mg atorvastatin if not improving in 6 months

## 2022-04-10 NOTE — Patient Instructions (Addendum)
Start ozempic 0.25 mg weekly. Call with update on weight and tolerance of medication  in 2-4 weeks.  Work on low cholesterol, low carb diet , regular exercise and weight loss.

## 2022-04-10 NOTE — Progress Notes (Signed)
Patient ID: Natalie Rice, female    DOB: 1955-06-10, 67 y.o.   MRN: [id number]  This visit was conducted in person.  BP (!) 140/90   Pulse 67   Temp 98.6 F (37 C) (Oral)   Ht 4' 11.25" (1.505 m)   Wt 181 lb 6 oz (82.3 kg)   LMP 12/26/2011   SpO2 99%   BMI 36.32 kg/m    CC:  Chief Complaint  Patient presents with   Diabetes    Subjective:   HPI: Natalie Rice is a 67 y.o. female presenting on 04/10/2022 for Diabetes  Diabetes:   Lab Results  Component Value Date   HGBA1C 6.7 (H) 04/02/2022  Using medications without difficulties: Hypoglycemic episodes: Hyperglycemic episodes: Feet problems: no ulcers Blood Sugars averaging: not checking at home. eye exam within last year: yes  Elevated Cholesterol: LDL not at goal < 100.. on atorvastatin 20 mg daily  She states she was on a cruise and had holiday's Lab Results  Component Value Date   CHOL 233 (H) 04/02/2022   HDL 55.30 04/02/2022   LDLCALC 148 (H) 04/02/2022   TRIG 151.0 (H) 04/02/2022   CHOLHDL 4 04/02/2022  Using medications without problems: none Muscle aches: none Diet compliance: moderate, working on low carb diet. Exercise: going  to gym daily...plans exercise daily going forward Other complaints:    Wt Readings from Last 3 Encounters:  04/10/22 181 lb 6 oz (82.3 kg)  12/18/21 180 lb (81.6 kg)  11/27/21 180 lb (81.6 kg)     Hypertension:   Borderline control in office today on losartan HCTZ 50/12.5 mg daily Element of  white coat. BP Readings from Last 3 Encounters:  04/10/22 (!) 140/90  12/18/21 132/78  11/20/21 (!) 147/75  Using medication without problems or lightheadedness:  none Chest pain with exertion:none Edema:none Short of breath:none Average home BPs: 132/77 Other issues:      Relevant past medical, surgical, family and social history reviewed and updated as indicated. Interim medical history since our last visit reviewed. Allergies and medications reviewed and  updated. Outpatient Medications Prior to Visit  Medication Sig Dispense Refill   anastrozole (ARIMIDEX) 1 MG tablet TAKE 1 TABLET BY MOUTH EVERY DAY 30 tablet 3   Apoaequorin (PREVAGEN PO) Take 1 tablet by mouth in the morning.     atorvastatin (LIPITOR) 20 MG tablet Take 1 tablet (20 mg total) by mouth daily. 90 tablet 3   Cholecalciferol (VITAMIN D-3) 125 MCG (5000 UT) TABS Take 5,000 Units by mouth in the morning.     clotrimazole-betamethasone (LOTRISONE) cream Apply 1 Application topically daily. 30 g 0   Coenzyme Q10 (COQ10) 200 MG CAPS Take 200 mg by mouth in the morning.     Loratadine 10 MG CAPS Take 10 mg by mouth daily as needed (seasonal allergies.).     losartan-hydrochlorothiazide (HYZAAR) 50-12.5 MG tablet TAKE 1 TABLET BY MOUTH DAILY 90 tablet 3   Magnesium 250 MG TABS Take 250 mg by mouth in the morning.     Omega-3 Fatty Acids (OMEGA 3 PO) Take 1 capsule by mouth daily.     Potassium 99 MG TABS Take 99 mg by mouth in the morning.     No facility-administered medications prior to visit.     Per HPI unless specifically indicated in ROS section below Review of Systems  Constitutional:  Negative for fatigue and fever.  HENT:  Negative for congestion.   Eyes:  Negative for pain.  Respiratory:  Negative for cough and shortness of breath.   Cardiovascular:  Negative for chest pain, palpitations and leg swelling.  Gastrointestinal:  Negative for abdominal pain.  Genitourinary:  Negative for dysuria and vaginal bleeding.  Musculoskeletal:  Negative for back pain.  Neurological:  Negative for syncope, light-headedness and headaches.  Psychiatric/Behavioral:  Negative for dysphoric mood.    Objective:  BP (!) 140/90   Pulse 67   Temp 98.6 F (37 C) (Oral)   Ht 4' 11.25" (1.505 m)   Wt 181 lb 6 oz (82.3 kg)   LMP 12/26/2011   SpO2 99%   BMI 36.32 kg/m   Wt Readings from Last 3 Encounters:  04/10/22 181 lb 6 oz (82.3 kg)  12/18/21 180 lb (81.6 kg)  11/27/21 180 lb  (81.6 kg)      Physical Exam Constitutional:      General: She is not in acute distress.    Appearance: Normal appearance. She is well-developed. She is not ill-appearing or toxic-appearing.  HENT:     Head: Normocephalic.     Right Ear: Hearing, tympanic membrane, ear canal and external ear normal. Tympanic membrane is not erythematous, retracted or bulging.     Left Ear: Hearing, tympanic membrane, ear canal and external ear normal. Tympanic membrane is not erythematous, retracted or bulging.     Nose: No mucosal edema or rhinorrhea.     Right Sinus: No maxillary sinus tenderness or frontal sinus tenderness.     Left Sinus: No maxillary sinus tenderness or frontal sinus tenderness.     Mouth/Throat:     Pharynx: Uvula midline.  Eyes:     General: Lids are normal. Lids are everted, no foreign bodies appreciated.     Conjunctiva/sclera: Conjunctivae normal.     Pupils: Pupils are equal, round, and reactive to light.  Neck:     Thyroid: No thyroid mass or thyromegaly.     Vascular: No carotid bruit.     Trachea: Trachea normal.  Cardiovascular:     Rate and Rhythm: Normal rate and regular rhythm.     Pulses: Normal pulses.     Heart sounds: Normal heart sounds, S1 normal and S2 normal. No murmur heard.    No friction rub. No gallop.  Pulmonary:     Effort: Pulmonary effort is normal. No tachypnea or respiratory distress.     Breath sounds: Normal breath sounds. No decreased breath sounds, wheezing, rhonchi or rales.  Abdominal:     General: Bowel sounds are normal.     Palpations: Abdomen is soft.     Tenderness: There is no abdominal tenderness.  Musculoskeletal:     Cervical back: Normal range of motion and neck supple.  Skin:    General: Skin is warm and dry.     Findings: No rash.  Neurological:     Mental Status: She is alert.  Psychiatric:        Mood and Affect: Mood is not anxious or depressed.        Speech: Speech normal.        Behavior: Behavior normal.  Behavior is cooperative.        Thought Content: Thought content normal.        Judgment: Judgment normal.       Results for orders placed or performed in visit on 04/02/22  Microalbumin / creatinine urine ratio  Result Value Ref Range   Microalb, Ur 1.1 0.0 - 1.9 mg/dL   Creatinine,U 191.1 mg/dL  Microalb Creat Ratio 0.6 0.0 - 30.0 mg/g  Comprehensive metabolic panel  Result Value Ref Range   Sodium 140 135 - 145 mEq/L   Potassium 4.0 3.5 - 5.1 mEq/L   Chloride 102 96 - 112 mEq/L   CO2 30 19 - 32 mEq/L   Glucose, Bld 121 (H) 70 - 99 mg/dL   BUN 15 6 - 23 mg/dL   Creatinine, Ser 0.89 0.40 - 1.20 mg/dL   Total Bilirubin 0.5 0.2 - 1.2 mg/dL   Alkaline Phosphatase 104 39 - 117 U/L   AST 18 0 - 37 U/L   ALT 25 0 - 35 U/L   Total Protein 7.1 6.0 - 8.3 g/dL   Albumin 4.3 3.5 - 5.2 g/dL   GFR 67.91 >60.00 mL/min   Calcium 10.1 8.4 - 10.5 mg/dL  Lipid panel  Result Value Ref Range   Cholesterol 233 (H) 0 - 200 mg/dL   Triglycerides 151.0 (H) 0.0 - 149.0 mg/dL   HDL 55.30 >39.00 mg/dL   VLDL 30.2 0.0 - 40.0 mg/dL   LDL Cholesterol 148 (H) 0 - 99 mg/dL   Total CHOL/HDL Ratio 4    NonHDL 177.85   Hemoglobin A1c  Result Value Ref Range   Hgb A1c MFr Bld 6.7 (H) 4.6 - 6.5 %    Assessment and Plan  Diabetes mellitus without complication ([facility]) Assessment & Plan: Chronic, worsened control despite diet and lifestyle changes Start GLP1 given  BMI 36 and diabetes.... discussed medicaiton SE profile and use. Will start ozempic 0.25 mg weekly. she will call with update in 2-4 weeks.   Hypertension associated with diabetes ([place])  Hyperlipidemia associated with type 2 diabetes mellitus ([place]) Assessment & Plan: Chronic, inadequate control... at last OV increased atorvastatin to 20 mg daily. Will start with  weight management and diet change.. consider increase to 40 mg atorvastatin if not improving in 6 months   Other orders -     Ozempic (0.25 or 0.5 MG/DOSE); Inject 0.25 mg as  directed once a week.  Dispense: 3 mL; Refill: 11    Return in about 6 months (around 10/09/2022) for phone AMW,  fasting labs then 69mn CPE with me.   AEliezer Lofts MD

## 2022-04-10 NOTE — Assessment & Plan Note (Addendum)
Chronic, worsened control despite diet and lifestyle changes Start GLP1 given  BMI 36 and diabetes.... discussed medicaiton SE profile and use. Will start ozempic 0.25 mg weekly. she will call with update in 2-4 weeks.

## 2022-05-19 ENCOUNTER — Telehealth: Payer: Self-pay | Admitting: Family Medicine

## 2022-05-19 DIAGNOSIS — E119 Type 2 diabetes mellitus without complications: Secondary | ICD-10-CM

## 2022-05-19 MED ORDER — NOVOFINE PLUS PEN NEEDLE 32G X 4 MM MISC: 0 refills | Status: AC

## 2022-05-19 NOTE — Telephone Encounter (Signed)
NovoFine Plus pen needles prescription sent to [facility] in [place] as requested.

## 2022-05-19 NOTE — Telephone Encounter (Signed)
Prescription Request  05/19/2022  LOV: 04/10/2022  What is the name of the medication or equipment? Semaglutide,0.25 or 0.'5MG'$ /DOS, (OZEMPIC, 0.25 OR 0.5 MG/DOSE,) 2 MG/3ML SOPN   Have you contacted your pharmacy to request a refill? No   Which pharmacy would you like this sent to?  [id number] [address]  [id number]: [phone]: [phone]   Patient notified that their request is being sent to the clinical staff for review and that they should receive a response within 2 business days.   Please advise at Mobile [phone](mobile)

## 2022-05-19 NOTE — Addendum Note (Signed)
Addended by: [name] on: 05/19/2022 10:47 AM   Modules accepted: Orders

## 2022-05-19 NOTE — Telephone Encounter (Signed)
Pt called back returning Donna's call. Pt states she needs more needles for meds prescribed. Call back # [phone]

## 2022-05-19 NOTE — Telephone Encounter (Signed)
Left message for Natalie Rice that she should have refills available on [facility] at [facility].  Semaglutide was refilled on 04/10/2022 for 3 ml with 11 refills.  I ask that she call me back if she has any issues at the pharmacy.

## 2022-05-20 NOTE — Progress Notes (Unsigned)
Patient Care Team: Jinny Sanders, MD as PCP - General (Family Medicine) Rockwell Germany, RN as Oncology Nurse Navigator Mauro Kaufmann, RN as Oncology Nurse Navigator Truitt Merle, MD as Consulting Physician (Medical Oncology) Gery Pray, MD as Consulting Physician (Radiation Oncology) Coralie Keens, MD as Consulting Physician (General Surgery) Alla Feeling, NP as Nurse Practitioner (Nurse Practitioner)   CHIEF COMPLAINT: Follow-up right breast DCIS  Oncology History Overview Note   Cancer Staging  Ductal carcinoma in situ (DCIS) of right breast Staging form: Breast, AJCC 8th Edition - Clinical stage from 03/12/2021: Stage 0 (cTis (DCIS), cN0, cM0, ER+, PR+) - Signed by Truitt Merle, MD on 03/12/2021 Stage prefix: Initial diagnosis Nuclear grade: G3 - Pathologic stage from 03/27/2021: Stage Unknown (pTis (DCIS), pNX, cM0, G3, ER+, PR+, HER2: Not Assessed) - Signed by Truitt Merle, MD on 05/23/2021 Stage prefix: Initial diagnosis Histologic grading system: 3 grade system Residual tumor (R): R0 - None     Ductal carcinoma in situ (DCIS) of right breast  02/12/2021 Mammogram   Exam: 3D Mammogram Diagnotic - Right  IMPRESSION: The 1.2 cm grouped fine linear calcifications in the right breast are suspicious.   02/26/2021 Initial Biopsy   Diagnosis Breast, right, needle core biopsy, 9 o'clock, 5.5 cmfn - DUCTAL CARCINOMA IN SITU WITH NECROSIS AND CALCIFICATIONS - SEE COMMENT Microscopic Comment Based on the biopsy, the ductal carcinoma in situ has a comedo pattern, high nuclear grade and measures 0.1 cm in greatest linear extent.  PROGNOSTIC INDICATORS Results: Estrogen Receptor: 90%, POSITIVE, MODERATE STAINING INTENSITY Progesterone Receptor: 40%, POSITIVE, STRONG-MODERATE STAINING INTENSITY   03/05/2021 Initial Diagnosis   Ductal carcinoma in situ (DCIS) of right breast   03/12/2021 Cancer Staging   Staging form: Breast, AJCC 8th Edition - Clinical stage from  03/12/2021: Stage 0 (cTis (DCIS), cN0, cM0, ER+, PR+) - Signed by Truitt Merle, MD on 03/12/2021 Stage prefix: Initial diagnosis Nuclear grade: G3   03/27/2021 Cancer Staging   Staging form: Breast, AJCC 8th Edition - Pathologic stage from 03/27/2021: Stage Unknown (pTis (DCIS), pNX, cM0, G3, ER+, PR+, HER2: Not Assessed) - Signed by Truitt Merle, MD on 05/23/2021 Stage prefix: Initial diagnosis Histologic grading system: 3 grade system Residual tumor (R): R0 - None    Definitive Surgery   FINAL MICROSCOPIC DIAGNOSIS:   A. BREAST, RIGHT, LUMPECTOMY:  -  Ductal carcinoma in situ (DCIS), grade III (high), with central  ("comedo") necrosis and calcifications, 8 mm in greatest dimension.  -  Resected edges negative for DCIS, distance from DCIS to closest edge 7 mm, anterior.  -  Negative for invasive carcinoma.  -  Seed implants with foreign body reaction and inflammation.   B. BREAST, RIGHT NEW ANTERIOR MARGIN, EXCISION:  -  Benign breast tissue with mild cystic apocrine metaplasia.  -  Negative for DCIS and invasive carcinoma.    05/01/2021 - 05/29/2021 Radiation Therapy    Site Technique Total Dose (Gy) Dose per Fx (Gy) Completed Fx Beam Energies  Breast, Right: Breast_R 3D 40.05/40.05 2.67 15/15 6XFFF, 10XFFF  Breast, Right: Breast_R_Bst 3D 10/10 2 5/5 6X, 10X    08/20/2021 Survivorship   SCP delivered by Cira Rue, NP      CURRENT THERAPY: Anastrozole, starting 11/2021  INTERVAL HISTORY Ms. Wildrick returns for follow-up as scheduled, last seen by Dr. Burr Medico 02/19/2022.  She has mild joint stiffness after exercise and moderate hot flashes.  She occasionally gets a rash under her breasts.  Uses a fan.  The  side effects are tolerable.  Denies concerns in her breast such as new lump/mass, nipple discharge or inversion, or skin change.  She is on Ozempic for weight loss.  ROS  All other systems reviewed and negative  Past Medical History:  Diagnosis Date   Allergy    seasonal   Breast  cancer ([place]) 03/27/2021   right side lumpectemy   Colon polyp    Diabetes mellitus without complication ([place])    Eosinophilia 03/03/2012   Hay fever    High blood pressure    High cholesterol    History of radiation therapy    right breast 05/01/2021-05/29/2021  Dr Gery Pray   Hyperlipidemia    Sleep apnea    uses mouthguard     Past Surgical History:  Procedure Laterality Date   BREAST LUMPECTOMY WITH RADIOACTIVE SEED LOCALIZATION Right 03/27/2021   Procedure: RIGHT BREAST LUMPECTOMY WITH RADIOACTIVE SEED LOCALIZATION;  Surgeon: Coralie Keens, MD;  Location: [facility];  Service: General;  Laterality: Right;   COLONOSCOPY  2012   no polyps   TUBAL LIGATION       Outpatient Encounter Medications as of 05/21/2022  Medication Sig   anastrozole (ARIMIDEX) 1 MG tablet Take 1 tablet (1 mg total) by mouth daily.   Apoaequorin (PREVAGEN PO) Take 1 tablet by mouth in the morning.   atorvastatin (LIPITOR) 20 MG tablet Take 1 tablet (20 mg total) by mouth daily.   Cholecalciferol (VITAMIN D-3) 125 MCG (5000 UT) TABS Take 5,000 Units by mouth in the morning.   clotrimazole-betamethasone (LOTRISONE) cream Apply 1 Application topically daily.   Coenzyme Q10 (COQ10) 200 MG CAPS Take 200 mg by mouth in the morning.   Insulin Pen Needle (NOVOFINE PLUS PEN NEEDLE) 32G X 4 MM MISC Use to inject Ozempic once a week   Loratadine 10 MG CAPS Take 10 mg by mouth daily as needed (seasonal allergies.).   losartan-hydrochlorothiazide (HYZAAR) 50-12.5 MG tablet TAKE 1 TABLET BY MOUTH DAILY   Magnesium 250 MG TABS Take 250 mg by mouth in the morning.   Omega-3 Fatty Acids (OMEGA 3 PO) Take 1 capsule by mouth daily.   Potassium 99 MG TABS Take 99 mg by mouth in the morning.   Semaglutide,0.25 or 0.'5MG'$ /DOS, (OZEMPIC, 0.25 OR 0.5 MG/DOSE,) 2 MG/3ML SOPN Inject 0.25 mg as directed once a week.   [DISCONTINUED] anastrozole (ARIMIDEX) 1 MG tablet TAKE 1 TABLET BY MOUTH EVERY DAY   No facility-administered  encounter medications on file as of 05/21/2022.     Today's Vitals   05/21/22 1004 05/21/22 1029  BP: (!) 153/86   Pulse: 82   Resp: 16   Temp: 98.8 F (37.1 C)   TempSrc: Oral   SpO2: 99%   Weight: 178 lb 3 oz (80.8 kg)   PainSc:  0-No pain   Body mass index is 35.69 kg/m.   PHYSICAL EXAM GENERAL:alert, no distress and comfortable SKIN: no rash  EYES: sclera clear NECK: without mass LYMPH:  no palpable cervical or supraclavicular lymphadenopathy  LUNGS:  normal breathing effort HEART:  no lower extremity edema ABDOMEN: abdomen soft, non-tender and normal bowel sounds NEURO: alert & oriented x 3 with fluent speech, no focal motor/sensory deficits Breast exam: symmetric without nipple discharge or inversion. S/p left lumpectomy and radiation, incisions completely healed with mild scar tissue at the areola. Right central breast with faint erythema, no warmth or swelling. No palpable mass in either breast or axilla that I could appreciate.    CBC  Component Value Date/Time   WBC 5.4 05/21/2022 0943   WBC 9.3 09/15/2012 1229   RBC 5.26 (H) 05/21/2022 0943   HGB 15.2 (H) 05/21/2022 0943   HGB 13.6 09/10/2012 0704   HCT 44.7 05/21/2022 0943   HCT 41.2 09/10/2012 0704   PLT 321 05/21/2022 0943   PLT 278 09/10/2012 0704   MCV 85.0 05/21/2022 0943   MCV 85 09/10/2012 0704   MCH 28.9 05/21/2022 0943   MCHC 34.0 05/21/2022 0943   RDW 12.9 05/21/2022 0943   RDW 13.6 09/10/2012 0704   LYMPHSABS 1.6 05/21/2022 0943   LYMPHSABS 2.4 09/10/2012 0704   MONOABS 0.6 05/21/2022 0943   MONOABS 0.7 09/10/2012 0704   EOSABS 0.3 05/21/2022 0943   EOSABS 0.4 09/10/2012 0704   BASOSABS 0.1 05/21/2022 0943   BASOSABS 0.1 09/10/2012 0704     CMP     Component Value Date/Time   NA 139 05/21/2022 0943   NA 138 09/10/2012 0704   K 3.7 05/21/2022 0943   K 3.7 09/10/2012 0704   CL 103 05/21/2022 0943   CL 104 09/10/2012 0704   CO2 29 05/21/2022 0943   CO2 28 09/10/2012 0704    GLUCOSE 101 (H) 05/21/2022 0943   GLUCOSE 89 09/10/2012 0704   BUN 9 05/21/2022 0943   BUN 20 (H) 09/10/2012 0704   CREATININE 1.00 05/21/2022 0943   CREATININE 0.98 09/10/2012 0704   CALCIUM 10.2 05/21/2022 0943   CALCIUM 9.7 09/10/2012 0704   PROT 7.4 05/21/2022 0943   PROT 8.3 (H) 03/23/2012 1536   ALBUMIN 4.4 05/21/2022 0943   ALBUMIN 4.2 03/23/2012 1536   AST 19 05/21/2022 0943   ALT 23 05/21/2022 0943   ALT 36 03/23/2012 1536   ALKPHOS 93 05/21/2022 0943   ALKPHOS 91 03/23/2012 1536   BILITOT 0.6 05/21/2022 0943   GFRNONAA >60 05/21/2022 0943   GFRNONAA >60 09/10/2012 0704   GFRAA >60 09/10/2012 0704     ASSESSMENT & PLAN: Donah Driver L. Casali is a 67 y.o. female with     1. Right breast DCIS, grade 3, ER+/PR+ -Diagnosed 01/23/21. S/p right lumpectomy on 03/27/21 and adjuvant radiation under Dr. Sondra Come, 05/01/21 - 05/29/21 -she started adjuvant anastrozole in 01/2022, she has been tolerating well with mild joint stiffness after exercise and moderate of hot flashes. SE's are tolerable -Ms. Glomski appears stable.    -We discussed symptom management for hot flashes including medication such as gabapentin, SSRI, or Veozah.  She declines at this time and prefers to monitor/manage on her own -Breast exam is benign, she is overdue for mammogram and will proceed in the next 1-3 months. -Labs reviewed, unremarkable.  Overall no clinical concern for recurrence. -Continue breast cancer surveillance and anastrozole -Follow-up in 6 months, or sooner if needed   2. Bone Health -baseline DEXA on 07/07/21 was borderline normal, with T-score of -1.0 at left femoral neck. -Continue calcium, vitamin D, and weightbearing exercise  3.  Age-appropriate health maintenance -Reportedly up-to-date on age-appropriate cancer screenings -On Ozempic for weight loss, follow-up PCP     PLAN: -Labs reviewed -Continue breast cancer surveillance and anastrozole (refilled) -Discussed symptom management  for hot flashes, declined medication -Overdue for mammogram, will proceed in the next 1-3 months -Follow-up in 6 months, or sooner if needed  Orders Placed This Encounter  Procedures   MM DIAG BREAST TOMO BILATERAL    Standing Status:   Future    Standing Expiration Date:   05/21/2023    Scheduling Instructions:  [name]    Order Specific Question:   Reason for Exam (SYMPTOM  OR DIAGNOSIS REQUIRED)    Answer:   R breast cancer 02/2021 s/p lumpectomy 03/2021 and RT; first mammo post treatment    Order Specific Question:   Preferred imaging location?    Answer:   External      All questions were answered. The patient knows to call the clinic with any problems, questions or concerns. No barriers to learning were detected.   Cira Rue, NP-C 05/21/2022

## 2022-05-21 ENCOUNTER — Other Ambulatory Visit: Payer: Self-pay

## 2022-05-21 ENCOUNTER — Encounter: Payer: Self-pay | Admitting: Nurse Practitioner

## 2022-05-21 ENCOUNTER — Inpatient Hospital Stay: Payer: Medicare Other | Admitting: Nurse Practitioner

## 2022-05-21 ENCOUNTER — Inpatient Hospital Stay: Payer: Medicare Other | Attending: Hematology

## 2022-05-21 ENCOUNTER — Encounter: Payer: Self-pay | Admitting: Radiology

## 2022-05-21 VITALS — BP 153/86 | HR 82 | Temp 98.8°F | Resp 16 | Wt 178.2 lb

## 2022-05-21 DIAGNOSIS — Z923 Personal history of irradiation: Secondary | ICD-10-CM | POA: Insufficient documentation

## 2022-05-21 DIAGNOSIS — Z780 Asymptomatic menopausal state: Secondary | ICD-10-CM | POA: Insufficient documentation

## 2022-05-21 DIAGNOSIS — Z79811 Long term (current) use of aromatase inhibitors: Secondary | ICD-10-CM | POA: Insufficient documentation

## 2022-05-21 DIAGNOSIS — R232 Flushing: Secondary | ICD-10-CM | POA: Insufficient documentation

## 2022-05-21 DIAGNOSIS — D0511 Intraductal carcinoma in situ of right breast: Secondary | ICD-10-CM | POA: Diagnosis not present

## 2022-05-21 DIAGNOSIS — M2560 Stiffness of unspecified joint, not elsewhere classified: Secondary | ICD-10-CM | POA: Insufficient documentation

## 2022-05-21 LAB — CBC WITH DIFFERENTIAL (CANCER CENTER ONLY)
Abs Immature Granulocytes: 0.01 10*3/uL (ref 0.00–0.07)
Basophils Absolute: 0.1 10*3/uL (ref 0.0–0.1)
Basophils Relative: 1 %
Eosinophils Absolute: 0.3 10*3/uL (ref 0.0–0.5)
Eosinophils Relative: 6 %
HCT: 44.7 % (ref 36.0–46.0)
Hemoglobin: 15.2 g/dL — ABNORMAL HIGH (ref 12.0–15.0)
Immature Granulocytes: 0 %
Lymphocytes Relative: 29 %
Lymphs Abs: 1.6 10*3/uL (ref 0.7–4.0)
MCH: 28.9 pg (ref 26.0–34.0)
MCHC: 34 g/dL (ref 30.0–36.0)
MCV: 85 fL (ref 80.0–100.0)
Monocytes Absolute: 0.6 10*3/uL (ref 0.1–1.0)
Monocytes Relative: 11 %
Neutro Abs: 2.9 10*3/uL (ref 1.7–7.7)
Neutrophils Relative %: 53 %
Platelet Count: 321 10*3/uL (ref 150–400)
RBC: 5.26 MIL/uL — ABNORMAL HIGH (ref 3.87–5.11)
RDW: 12.9 % (ref 11.5–15.5)
WBC Count: 5.4 10*3/uL (ref 4.0–10.5)
nRBC: 0 % (ref 0.0–0.2)

## 2022-05-21 LAB — CMP (CANCER CENTER ONLY)
ALT: 23 U/L (ref 0–44)
AST: 19 U/L (ref 15–41)
Albumin: 4.4 g/dL (ref 3.5–5.0)
Alkaline Phosphatase: 93 U/L (ref 38–126)
Anion gap: 7 (ref 5–15)
BUN: 9 mg/dL (ref 8–23)
CO2: 29 mmol/L (ref 22–32)
Calcium: 10.2 mg/dL (ref 8.9–10.3)
Chloride: 103 mmol/L (ref 98–111)
Creatinine: 1 mg/dL (ref 0.44–1.00)
GFR, Estimated: >60 mL/min (ref >60)
Glucose, Bld: 101 mg/dL — ABNORMAL HIGH (ref 70–99)
Potassium: 3.7 mmol/L (ref 3.5–5.1)
Sodium: 139 mmol/L (ref 135–145)
Total Bilirubin: 0.6 mg/dL (ref 0.3–1.2)
Total Protein: 7.4 g/dL (ref 6.5–8.1)

## 2022-05-21 MED ORDER — ANASTROZOLE 1 MG PO TABS: 1.0000 mg | ORAL_TABLET | Freq: Every day | ORAL | 3 refills | Status: DC

## 2022-07-21 ENCOUNTER — Other Ambulatory Visit: Payer: Self-pay | Admitting: Family Medicine

## 2022-07-21 DIAGNOSIS — I152 Hypertension secondary to endocrine disorders: Secondary | ICD-10-CM

## 2022-07-28 ENCOUNTER — Telehealth: Payer: Self-pay | Admitting: Family Medicine

## 2022-07-28 MED ORDER — OZEMPIC (0.25 OR 0.5 MG/DOSE) 2 MG/3ML ~~LOC~~ SOPN: 0.5000 mg | PEN_INJECTOR | SUBCUTANEOUS | 11 refills | Status: DC

## 2022-07-28 NOTE — Addendum Note (Signed)
Addended by: Kerby Nora E on: 07/28/2022 04:59 PM   Modules accepted: Orders

## 2022-07-28 NOTE — Telephone Encounter (Signed)
Call We would likely be able to increase the Ozempic but given she has diabetes I would suggest she return for 60-month follow-up with point-of-care A1c as I have last seen her in January.  We can consider increasing at that appointment.

## 2022-07-28 NOTE — Telephone Encounter (Signed)
Natalie Rice notified as instructed by telephone.  She is currently working out of town so it would be a couple of weeks before she could come in.  She already has her CPE scheduled on 10/06/22. Patient states her weight  is 178 lbs and she has not had any side effects on the current dose of Ozempic.  Pharmacy [facility] [place]  Please advise.

## 2022-07-28 NOTE — Telephone Encounter (Signed)
Wt Readings from Last 3 Encounters:  05/21/22 178 lb 3 oz (80.8 kg)  04/10/22 181 lb 6 oz (82.3 kg)  12/18/21 180 lb (81.6 kg)    Minimal weight loss on low dose ozempic... will increased to 0.5 mg weekly. Lab Results  Component Value Date   HGBA1C 6.7 (H) 04/02/2022   Let patient know 0.5 mg weekly dose of semaglutide (Ozempic) sent to her pharmacy.

## 2022-07-28 NOTE — Telephone Encounter (Signed)
Patient contacted the office regarding medication ozempic she takes. States she is unsure if medication is working, states during the first week or two she lost 4 pounds but believes she may have gained that back. Would like to now if Dr. Ermalene Searing can change her dosage or recommend anything for her. Would like a call back whenever possible, please advise [phone] thank you.

## 2022-07-29 NOTE — Telephone Encounter (Signed)
Left message for Natalie Rice that Dr. Ermalene Searing did go ahead and increase her Ozempic to 0.5 mg weekly.   [facility] Rx [facility] [place].  I ask that she call me back if she has any questions.

## 2022-08-01 ENCOUNTER — Other Ambulatory Visit: Payer: Self-pay | Admitting: Nurse Practitioner

## 2022-09-10 DIAGNOSIS — H2513 Age-related nuclear cataract, bilateral: Secondary | ICD-10-CM | POA: Diagnosis not present

## 2022-09-10 DIAGNOSIS — E119 Type 2 diabetes mellitus without complications: Secondary | ICD-10-CM | POA: Diagnosis not present

## 2022-09-10 DIAGNOSIS — H524 Presbyopia: Secondary | ICD-10-CM | POA: Diagnosis not present

## 2022-09-10 LAB — OPHTHALMOLOGY REPORT-SCANNED

## 2022-09-18 ENCOUNTER — Telehealth: Payer: Self-pay | Admitting: *Deleted

## 2022-09-18 DIAGNOSIS — E1169 Type 2 diabetes mellitus with other specified complication: Secondary | ICD-10-CM

## 2022-09-18 DIAGNOSIS — E119 Type 2 diabetes mellitus without complications: Secondary | ICD-10-CM

## 2022-09-18 LAB — HM DIABETES EYE EXAM

## 2022-09-18 NOTE — Telephone Encounter (Signed)
-----   Message from Alvina Chou sent at 09/18/2022 11:31 AM EDT ----- Regarding: Lab orders for Tuesday, 7.16.24 Patient is scheduled for CPX labs, please order future labs, Thanks , Camelia Eng

## 2022-09-29 ENCOUNTER — Other Ambulatory Visit (INDEPENDENT_AMBULATORY_CARE_PROVIDER_SITE_OTHER): Payer: Medicare Other

## 2022-09-29 DIAGNOSIS — E785 Hyperlipidemia, unspecified: Secondary | ICD-10-CM | POA: Diagnosis not present

## 2022-09-29 DIAGNOSIS — E1169 Type 2 diabetes mellitus with other specified complication: Secondary | ICD-10-CM | POA: Diagnosis not present

## 2022-09-29 DIAGNOSIS — E119 Type 2 diabetes mellitus without complications: Secondary | ICD-10-CM

## 2022-09-29 LAB — COMPREHENSIVE METABOLIC PANEL
ALT: 35 U/L (ref 0–35)
AST: 29 U/L (ref 0–37)
Albumin: 4.4 g/dL (ref 3.5–5.2)
Alkaline Phosphatase: 100 U/L (ref 39–117)
BUN: 12 mg/dL (ref 6–23)
CO2: 27 mEq/L (ref 19–32)
Calcium: 10.9 mg/dL — ABNORMAL HIGH (ref 8.4–10.5)
Chloride: 102 mEq/L (ref 96–112)
Creatinine, Ser: 1 mg/dL (ref 0.40–1.20)
GFR: 58.84 mL/min — ABNORMAL LOW (ref >60.00)
Glucose, Bld: 106 mg/dL — ABNORMAL HIGH (ref 70–99)
Potassium: 3.7 mEq/L (ref 3.5–5.1)
Sodium: 137 mEq/L (ref 135–145)
Total Bilirubin: 0.6 mg/dL (ref 0.2–1.2)
Total Protein: 7.4 g/dL (ref 6.0–8.3)

## 2022-09-29 LAB — LIPID PANEL
Cholesterol: 211 mg/dL — ABNORMAL HIGH (ref 0–200)
HDL: 54.2 mg/dL (ref >39.00)
LDL Cholesterol: 129 mg/dL — ABNORMAL HIGH (ref 0–99)
NonHDL: 156.77
Total CHOL/HDL Ratio: 4
Triglycerides: 140 mg/dL (ref 0.0–149.0)
VLDL: 28 mg/dL (ref 0.0–40.0)

## 2022-09-29 LAB — HEMOGLOBIN A1C: Hgb A1c MFr Bld: 6.3 % (ref 4.6–6.5)

## 2022-09-30 ENCOUNTER — Other Ambulatory Visit: Payer: Self-pay | Admitting: Family Medicine

## 2022-09-30 DIAGNOSIS — I152 Hypertension secondary to endocrine disorders: Secondary | ICD-10-CM

## 2022-10-04 NOTE — Progress Notes (Signed)
No critical labs need to be addressed urgently. We will discuss labs in detail at upcoming office visit.   

## 2022-10-06 ENCOUNTER — Ambulatory Visit (INDEPENDENT_AMBULATORY_CARE_PROVIDER_SITE_OTHER): Payer: Medicare Other

## 2022-10-06 ENCOUNTER — Encounter: Payer: Self-pay | Admitting: Family Medicine

## 2022-10-06 ENCOUNTER — Ambulatory Visit: Payer: Medicare Other | Admitting: Family Medicine

## 2022-10-06 VITALS — BP 152/80 | HR 81 | Resp 17 | Ht 59.25 in | Wt 177.6 lb

## 2022-10-06 VITALS — BP 154/78 | HR 81 | Temp 97.6°F | Ht 59.25 in | Wt 177.6 lb

## 2022-10-06 DIAGNOSIS — E669 Obesity, unspecified: Secondary | ICD-10-CM

## 2022-10-06 DIAGNOSIS — I152 Hypertension secondary to endocrine disorders: Secondary | ICD-10-CM | POA: Diagnosis not present

## 2022-10-06 DIAGNOSIS — E119 Type 2 diabetes mellitus without complications: Secondary | ICD-10-CM | POA: Diagnosis not present

## 2022-10-06 DIAGNOSIS — E1159 Type 2 diabetes mellitus with other circulatory complications: Secondary | ICD-10-CM | POA: Diagnosis not present

## 2022-10-06 DIAGNOSIS — Z7985 Long-term (current) use of injectable non-insulin antidiabetic drugs: Secondary | ICD-10-CM

## 2022-10-06 DIAGNOSIS — E785 Hyperlipidemia, unspecified: Secondary | ICD-10-CM | POA: Diagnosis not present

## 2022-10-06 DIAGNOSIS — Z0000 Encounter for general adult medical examination without abnormal findings: Secondary | ICD-10-CM | POA: Diagnosis not present

## 2022-10-06 DIAGNOSIS — E1169 Type 2 diabetes mellitus with other specified complication: Secondary | ICD-10-CM

## 2022-10-06 LAB — HM DIABETES FOOT EXAM

## 2022-10-06 NOTE — Assessment & Plan Note (Signed)
Acute, she does take multiple supplements that could contain calcium.  She will stop these and we will reevaluate in 6 months.

## 2022-10-06 NOTE — Assessment & Plan Note (Signed)
Chronic, above goal on losartan 50/12.5 mg daily.  We will have her increase to 100/25 mg p.o. daily she will follow her blood pressure at home.  Hopefully she will be able to decrease back to the lower dose as she loses weight on Ozempic.

## 2022-10-06 NOTE — Patient Instructions (Addendum)
Increase blood pressure medication to  2 tablets of losartan 50/12.5 mg daily.  Follow BP at home.Marland Kitchen goal < 140/90.  Continue ozempic at 0.5 mg weekly.  Stop calcium containing vitamins/ supplements.  Schedule mammogram.   For your trip.Marland Kitchen look at [email or web address] for recommendations.  If you need Hep B/A vaccine.. make a nurse visit here.  Call 2 month prior to trip for malaria antibiotic prescription if needed.  Look into locations where you can get yellow fever vaccine.

## 2022-10-06 NOTE — Assessment & Plan Note (Signed)
Wt Readings from Last 3 Encounters:  10/06/22 181 lb 6 oz (82.3 kg)  10/06/22 177 lb 9.6 oz (80.6 kg)  05/21/22 178 lb 3 oz (80.8 kg)   Body mass index is 36.32 kg/m.  Chronic, poor control Encouraged exercise, weight loss, healthy eating habits. Ozempic 0.5 mg weekly

## 2022-10-06 NOTE — Progress Notes (Signed)
Subjective:   Natalie Rice is a 67 y.o. female who presents for Medicare Annual (Subsequent) preventive examination.  Visit Complete: In person   Review of Systems      Cardiac Risk Factors include: advanced age (>13men, >52 women);hypertension;obesity (BMI >30kg/m2);dyslipidemia;diabetes mellitus     Objective:    Today's Vitals   10/06/22 1013  BP: (!) 152/80  Pulse: 81  Resp: 17  SpO2: 99%  Weight: 177 lb 9.6 oz (80.6 kg)  Height: 4' 11.25" (1.505 m)   Body mass index is 35.57 kg/m.     10/06/2022   10:27 AM 06/30/2021    4:00 PM 04/23/2021    9:47 AM 03/27/2021    7:27 AM  Advanced Directives  Does Patient Have a Medical Advance Directive? No No No No  Would patient like information on creating a medical advance directive? No - Patient declined No - Patient declined No - Patient declined     Current Medications (verified) Outpatient Encounter Medications as of 10/06/2022  Medication Sig   anastrozole (ARIMIDEX) 1 MG tablet TAKE 1 TABLET BY MOUTH DAILY   Apoaequorin (PREVAGEN PO) Take 1 tablet by mouth in the morning.   atorvastatin (LIPITOR) 20 MG tablet Take 1 tablet (20 mg total) by mouth daily.   Cholecalciferol (VITAMIN D-3) 125 MCG (5000 UT) TABS Take 5,000 Units by mouth in the morning.   clotrimazole-betamethasone (LOTRISONE) cream Apply 1 Application topically daily.   Coenzyme Q10 (COQ10) 200 MG CAPS Take 200 mg by mouth in the morning.   Insulin Pen Needle (NOVOFINE PLUS PEN NEEDLE) 32G X 4 MM MISC Use to inject Ozempic once a week   Loratadine 10 MG CAPS Take 10 mg by mouth daily as needed (seasonal allergies.).   losartan-hydrochlorothiazide (HYZAAR) 50-12.5 MG tablet TAKE 1 TABLET BY MOUTH DAILY   Magnesium 250 MG TABS Take 250 mg by mouth in the morning.   Omega-3 Fatty Acids (OMEGA 3 PO) Take 1 capsule by mouth daily.   Potassium 99 MG TABS Take 99 mg by mouth in the morning.   Semaglutide,0.25 or 0.5MG /DOS, (OZEMPIC, 0.25 OR 0.5 MG/DOSE,) 2  MG/3ML SOPN Inject 0.5 mg into the skin once a week.   No facility-administered encounter medications on file as of 10/06/2022.    Allergies (verified) Patient has no known allergies.   History: Past Medical History:  Diagnosis Date   Allergy    seasonal   Breast cancer (HCC) 03/27/2021   right side lumpectemy   Colon polyp    Diabetes mellitus without complication (HCC)    Eosinophilia 03/03/2012   Hay fever    High blood pressure    High cholesterol    History of radiation therapy    right breast 05/01/2021-05/29/2021  Dr Antony Blackbird   Hyperlipidemia    Sleep apnea    uses mouthguard   Past Surgical History:  Procedure Laterality Date   BREAST LUMPECTOMY WITH RADIOACTIVE SEED LOCALIZATION Right 03/27/2021   Procedure: RIGHT BREAST LUMPECTOMY WITH RADIOACTIVE SEED LOCALIZATION;  Surgeon: Abigail Miyamoto, MD;  Location: [facility] OR;  Service: General;  Laterality: Right;   COLONOSCOPY  2012   no polyps   TUBAL LIGATION     Family History  Problem Relation Age of Onset   Cancer Mother 63       cervical cancer   Heart disease Father 39       MI   Heart disease Brother 63       MI   Diabetes Maternal  Grandmother    Diabetes Maternal Grandfather    Colon cancer Neg Hx    Colon polyps Neg Hx    Esophageal cancer Neg Hx    Rectal cancer Neg Hx    Stomach cancer Neg Hx    Social History   Socioeconomic History   Marital status: Married    Spouse name: Not on file   Number of children: 0   Years of education: Not on file   Highest education level: Not on file  Occupational History    Employer: [employer]    Comment: [employer]  Tobacco Use   Smoking status: Former    Current packs/day: 0.00    Average packs/day: 1 pack/day for 3.0 years (3.0 ttl pk-yrs)    Types: Cigarettes    Start date: 03/16/2002    Quit date: 03/16/2005    Years since quitting: 17.5   Smokeless tobacco: Never   Tobacco comments:    quit 2007  Vaping Use   Vaping status: Never Used   Substance and Sexual Activity   Alcohol use: Yes    Comment: occassionally, every few months   Drug use: Never   Sexual activity: Not Currently    Birth control/protection: Post-menopausal, Surgical  Other Topics Concern   Not on file  Social History Narrative   Not on file   Social Determinants of Health   Financial Resource Strain: Low Risk  (10/06/2022)   Overall Financial Resource Strain (CARDIA)    Difficulty of Paying Living Expenses: Not hard at all  Food Insecurity: No Food Insecurity (10/06/2022)   Hunger Vital Sign    Worried About Running Out of Food in the Last Year: Never true    Ran Out of Food in the Last Year: Never true  Transportation Needs: No Transportation Needs (10/06/2022)   PRAPARE - Administrator, Civil Service (Medical): No    Lack of Transportation (Non-Medical): No  Physical Activity: Sufficiently Active (10/06/2022)   Exercise Vital Sign    Days of Exercise per Week: 3 days    Minutes of Exercise per Session: 90 min  Stress: No Stress Concern Present (10/06/2022)   [employer] of Occupational Health - Occupational Stress Questionnaire    Feeling of Stress : Not at all  Social Connections: Socially Integrated (10/06/2022)   Social Connection and Isolation Panel [NHANES]    Frequency of Communication with Friends and Family: More than three times a week    Frequency of Social Gatherings with Friends and Family: More than three times a week    Attends Religious Services: More than 4 times per year    Active Member of [employer] or Organizations: Yes    Attends Engineer, structural: More than 4 times per year    Marital Status: Married    Tobacco Counseling Counseling given: Not Answered Tobacco comments: quit 2007   Clinical Intake:  Pre-visit preparation completed: Yes  Pain : No/denies pain     BMI - recorded: 35.57 Nutritional Status: BMI > 30  Obese Nutritional Risks: None Diabetes: Yes CBG done?: No Did pt.  bring in CBG monitor from home?: No  How often do you need to have someone help you when you read instructions, pamphlets, or other written materials from your doctor or pharmacy?: 1 - Never  Interpreter Needed?: No  Information entered by :: C.Loyal Holzheimer LPN   Activities of Daily Living    10/06/2022   10:29 AM  In your present state of  health, do you have any difficulty performing the following activities:  Hearing? 0  Vision? 0  Difficulty concentrating or making decisions? 0  Walking or climbing stairs? 0  Dressing or bathing? 0  Doing errands, shopping? 0  Preparing Food and eating ? N  Using the Toilet? N  In the past six months, have you accidently leaked urine? Y  Comment occasionally when laughing  Do you have problems with loss of bowel control? N  Managing your Medications? N  Managing your Finances? N  Housekeeping or managing your Housekeeping? N    Patient Care Team: Excell Seltzer, MD as PCP - General (Family Medicine) Donnelly Angelica, RN as Oncology Nurse Navigator Pershing Proud, RN as Oncology Nurse Navigator Malachy Mood, MD as Consulting Physician (Medical Oncology) Antony Blackbird, MD as Consulting Physician (Radiation Oncology) Abigail Miyamoto, MD as Consulting Physician (General Surgery) Pollyann Samples, NP as Nurse Practitioner (Nurse Practitioner)  Indicate any recent Medical Services you may have received from other than [facility] providers in the past year (date may be approximate).     Assessment:   This is a routine wellness examination for Kalisha.  Hearing/Vision screen Hearing Screening - Comments:: Denies hearing difficulties   Vision Screening - Comments:: Glasses - [facility] - UTD on eye exams  Dietary issues and exercise activities discussed:     Goals Addressed             This Visit's Progress    Patient Stated       Lose some weight.       Depression Screen    10/06/2022   10:26 AM 04/10/2022   10:54 AM 09/23/2021     9:09 AM 05/29/2020    9:25 AM 05/02/2019   11:26 AM 03/04/2018    9:11 AM 12/03/2016    4:09 PM  PHQ 2/9 Scores  PHQ - 2 Score 0 0 0 0 0 0 0    Fall Risk    10/06/2022   10:28 AM 04/10/2022   10:54 AM 09/23/2021    9:09 AM  Fall Risk   Falls in the past year? 1 0 0  Number falls in past yr: 0 0   Comment fell on uneven ground    Injury with Fall? 0 0   Risk for fall due to : No Fall Risks No Fall Risks   Follow up Falls evaluation completed;Falls prevention discussed Falls evaluation completed     MEDICARE RISK AT HOME:  Medicare Risk at Home - 10/06/22 1030     Any stairs in or around the home? Yes    If so, are there any without handrails? No    Home free of loose throw rugs in walkways, pet beds, electrical cords, etc? Yes    Adequate lighting in your home to reduce risk of falls? Yes    Life alert? No    Use of a cane, walker or w/c? No    Grab bars in the bathroom? No    Shower chair or bench in shower? No    Elevated toilet seat or a handicapped toilet? No             TIMED UP AND GO:  Was the test performed?  Yes  Length of time to ambulate 10 feet: 14 sec Gait steady and fast without use of assistive device    Cognitive Function:        10/06/2022   10:31 AM  6CIT Screen  What  Year? 0 points  What month? 0 points  What time? 0 points  Count back from 20 0 points  Months in reverse 0 points  Repeat phrase 0 points  Total Score 0 points    Immunizations Immunization History  Administered Date(s) Administered   COVID-19, mRNA, vaccine(Comirnaty)12 years and older 12/27/2021   Influenza Split 11/29/2011   Influenza, High Dose Seasonal PF 12/28/2021   Influenza,inj,Quad PF,6+ Mos 12/19/2012, 12/15/2013, 01/29/2015, 12/31/2015, 01/13/2017, 12/30/2017, 11/24/2018, 12/23/2019   PFIZER(Purple Top)SARS-COV-2 Vaccination 05/18/2019, 06/09/2019, 01/01/2020, 09/04/2020   PNEUMOCOCCAL CONJUGATE-20 09/23/2021   Pfizer Covid Bivalent Pediatric Vaccine(22mos to  <70yrs) 02/27/2021   Pneumococcal Polysaccharide-23 05/29/2020   Tdap 06/22/2013   Zoster Recombinant(Shingrix) 11/24/2018, 02/23/2019    TDAP status: Up to date  Flu Vaccine status: Up to date  Pneumococcal vaccine status: Up to date  Covid-19 vaccine status: Completed vaccines  Qualifies for Shingles Vaccine? Yes   Zostavax completed No   Shingrix Completed?: Yes  Screening Tests Health Maintenance  Topic Date Due   COVID-19 Vaccine (7 - 2023-24 season) 02/21/2022   Medicare Annual Wellness (AWV)  09/24/2022   FOOT EXAM  09/24/2022   INFLUENZA VACCINE  10/15/2022   MAMMOGRAM  02/13/2023   HEMOGLOBIN A1C  04/01/2023   Diabetic kidney evaluation - Urine ACR  04/03/2023   DTaP/Tdap/Td (2 - Td or Tdap) 06/23/2023   OPHTHALMOLOGY EXAM  09/18/2023   Diabetic kidney evaluation - eGFR measurement  09/29/2023   Colonoscopy  12/18/2028   Pneumonia Vaccine 36+ Years old  Completed   DEXA SCAN  Completed   Hepatitis C Screening  Completed   Zoster Vaccines- Shingrix  Completed   HPV VACCINES  Aged Out    Health Maintenance  Health Maintenance Due  Topic Date Due   COVID-19 Vaccine (7 - 2023-24 season) 02/21/2022   Medicare Annual Wellness (AWV)  09/24/2022   FOOT EXAM  09/24/2022    Colorectal cancer screening: Type of screening: Colonoscopy. Completed 12/18/21. Repeat every 7 years  Mammogram status: Ordered 05/21/22. Pt provided with contact info and advised to call to schedule appt.  Pt stated that she will call to schedule appointment.  Bone Density status: Completed 07/07/21. Results reflect: Bone density results: NORMAL. Repeat every 5 years.  Lung Cancer Screening: (Low Dose CT Chest recommended if Age 54-80 years, 20 pack-year currently smoking OR have quit w/in 15years.) does not qualify.   Lung Cancer Screening Referral: n/a  Additional Screening:  Hepatitis C Screening: does qualify; Completed 12/15/15  Vision Screening: Recommended annual ophthalmology  exams for early detection of glaucoma and other disorders of the eye. Is the patient up to date with their annual eye exam?  Yes  Who is the provider or what is the name of the office in which the patient attends annual eye exams? [facility] If pt is not established with a provider, would they like to be referred to a provider to establish care? Yes .   Dental Screening: Recommended annual dental exams for proper oral hygiene  Diabetic Foot Exam: Diabetic Foot Exam: Completed 09/23/21  Community Resource Referral / Chronic Care Management: CRR required this visit?  No   CCM required this visit?  No     Plan:     I have personally reviewed and noted the following in the patient's chart:   Medical and social history Use of alcohol, tobacco or illicit drugs  Current medications and supplements including opioid prescriptions. Patient is not currently taking opioid prescriptions. Functional ability and  status Nutritional status Physical activity Advanced directives List of other physicians Hospitalizations, surgeries, and ER visits in previous 12 months Vitals Screenings to include cognitive, depression, and falls Referrals and appointments  In addition, I have reviewed and discussed with patient certain preventive protocols, quality metrics, and best practice recommendations. A written personalized care plan for preventive services as well as general preventive health recommendations were provided to patient.     Maryan Puls, LPN   [id number]   After Visit Summary: Available in MyChart  Nurse Notes: Pt would like to discuss travel vaccines with PCP at todays visit for upcoming trip out of the country.

## 2022-10-06 NOTE — Assessment & Plan Note (Signed)
Chronic, inadequate control on atorvastatin 20 mg p.o. daily but significant improvement in the last 6 months.  She will continue working on low-cholesterol diet and hopefully her numbers will continue to improve at 48-month check after weight loss with Ozempic.

## 2022-10-06 NOTE — Patient Instructions (Signed)
Natalie Rice , Thank you for taking time to come for your Medicare Wellness Visit. I appreciate your ongoing commitment to your health goals. Please review the following plan we discussed and let me know if I can assist you in the future.   These are the goals we discussed:  Goals      Patient Stated     Lose some weight.        This is a list of the screening recommended for you and due dates:  Health Maintenance  Topic Date Due   COVID-19 Vaccine (7 - 2023-24 season) 02/21/2022   Medicare Annual Wellness Visit  09/24/2022   Complete foot exam   09/24/2022   Flu Shot  10/15/2022   Mammogram  02/13/2023   Hemoglobin A1C  04/01/2023   Yearly kidney health urinalysis for diabetes  04/03/2023   DTaP/Tdap/Td vaccine (2 - Td or Tdap) 06/23/2023   Eye exam for diabetics  09/18/2023   Yearly kidney function blood test for diabetes  09/29/2023   Colon Cancer Screening  12/18/2028   Pneumonia Vaccine  Completed   DEXA scan (bone density measurement)  Completed   Hepatitis C Screening  Completed   Zoster (Shingles) Vaccine  Completed   HPV Vaccine  Aged Out    Advanced directives: none Advance directive discussed with you today.  We can give you the proper paperwork for you to fill out and it is available at the front desk.  Conditions/risks identified: none  Next appointment: Follow up in one year for your annual wellness visit 10/11/23 @ 10:45 televisit   Preventive Care 65 Years and Older, Female Preventive care refers to lifestyle choices and visits with your health care provider that can promote health and wellness. What does preventive care include? A yearly physical exam. This is also called an annual well check. Dental exams once or twice a year. Routine eye exams. Ask your health care provider how often you should have your eyes checked. Personal lifestyle choices, including: Daily care of your teeth and gums. Regular physical activity. Eating a healthy diet. Avoiding  tobacco and drug use. Limiting alcohol use. Practicing safe sex. Taking low-dose aspirin every day. Taking vitamin and mineral supplements as recommended by your health care provider. What happens during an annual well check? The services and screenings done by your health care provider during your annual well check will depend on your age, overall health, lifestyle risk factors, and family history of disease. Counseling  Your health care provider may ask you questions about your: Alcohol use. Tobacco use. Drug use. Emotional well-being. Home and relationship well-being. Sexual activity. Eating habits. History of falls. Memory and ability to understand (cognition). Work and work Astronomer. Reproductive health. Screening  You may have the following tests or measurements: Height, weight, and BMI. Blood pressure. Lipid and cholesterol levels. These may be checked every 5 years, or more frequently if you are over 36 years old. Skin check. Lung cancer screening. You may have this screening every year starting at age 19 if you have a 30-pack-year history of smoking and currently smoke or have quit within the past 15 years. Fecal occult blood test (FOBT) of the stool. You may have this test every year starting at age 74. Flexible sigmoidoscopy or colonoscopy. You may have a sigmoidoscopy every 5 years or a colonoscopy every 10 years starting at age 91. Hepatitis C blood test. Hepatitis B blood test. Sexually transmitted disease (STD) testing. Diabetes screening. This is done by checking  your blood sugar (glucose) after you have not eaten for a while (fasting). You may have this done every 1-3 years. Bone density scan. This is done to screen for osteoporosis. You may have this done starting at age 73. Mammogram. This may be done every 1-2 years. Talk to your health care provider about how often you should have regular mammograms. Talk with your health care provider about your test  results, treatment options, and if necessary, the need for more tests. Vaccines  Your health care provider may recommend certain vaccines, such as: Influenza vaccine. This is recommended every year. Tetanus, diphtheria, and acellular pertussis (Tdap, Td) vaccine. You may need a Td booster every 10 years. Zoster vaccine. You may need this after age 56. Pneumococcal 13-valent conjugate (PCV13) vaccine. One dose is recommended after age 91. Pneumococcal polysaccharide (PPSV23) vaccine. One dose is recommended after age 29. Talk to your health care provider about which screenings and vaccines you need and how often you need them. This information is not intended to replace advice given to you by your health care provider. Make sure you discuss any questions you have with your health care provider. Document Released: 03/29/2015 Document Revised: 11/20/2015 Document Reviewed: 01/01/2015 Elsevier Interactive Patient Education  2017 [employer].  Fall Prevention in the Home Falls can cause injuries. They can happen to people of all ages. There are many things you can do to make your home safe and to help prevent falls. What can I do on the outside of my home? Regularly fix the edges of walkways and driveways and fix any cracks. Remove anything that might make you trip as you walk through a door, such as a raised step or threshold. Trim any bushes or trees on the path to your home. Use bright outdoor lighting. Clear any walking paths of anything that might make someone trip, such as rocks or tools. Regularly check to see if handrails are loose or broken. Make sure that both sides of any steps have handrails. Any raised decks and porches should have guardrails on the edges. Have any leaves, snow, or ice cleared regularly. Use sand or salt on walking paths during winter. Clean up any spills in your garage right away. This includes oil or grease spills. What can I do in the bathroom? Use night  lights. Install grab bars by the toilet and in the tub and shower. Do not use towel bars as grab bars. Use non-skid mats or decals in the tub or shower. If you need to sit down in the shower, use a plastic, non-slip stool. Keep the floor dry. Clean up any water that spills on the floor as soon as it happens. Remove soap buildup in the tub or shower regularly. Attach bath mats securely with double-sided non-slip rug tape. Do not have throw rugs and other things on the floor that can make you trip. What can I do in the bedroom? Use night lights. Make sure that you have a light by your bed that is easy to reach. Do not use any sheets or blankets that are too big for your bed. They should not hang down onto the floor. Have a firm chair that has side arms. You can use this for support while you get dressed. Do not have throw rugs and other things on the floor that can make you trip. What can I do in the kitchen? Clean up any spills right away. Avoid walking on wet floors. Keep items that you use a lot  in easy-to-reach places. If you need to reach something above you, use a strong step stool that has a grab bar. Keep electrical cords out of the way. Do not use floor polish or wax that makes floors slippery. If you must use wax, use non-skid floor wax. Do not have throw rugs and other things on the floor that can make you trip. What can I do with my stairs? Do not leave any items on the stairs. Make sure that there are handrails on both sides of the stairs and use them. Fix handrails that are broken or loose. Make sure that handrails are as long as the stairways. Check any carpeting to make sure that it is firmly attached to the stairs. Fix any carpet that is loose or worn. Avoid having throw rugs at the top or bottom of the stairs. If you do have throw rugs, attach them to the floor with carpet tape. Make sure that you have a light switch at the top of the stairs and the bottom of the stairs. If  you do not have them, ask someone to add them for you. What else can I do to help prevent falls? Wear shoes that: Do not have high heels. Have rubber bottoms. Are comfortable and fit you well. Are closed at the toe. Do not wear sandals. If you use a stepladder: Make sure that it is fully opened. Do not climb a closed stepladder. Make sure that both sides of the stepladder are locked into place. Ask someone to hold it for you, if possible. Clearly mark and make sure that you can see: Any grab bars or handrails. First and last steps. Where the edge of each step is. Use tools that help you move around (mobility aids) if they are needed. These include: Canes. Walkers. Scooters. Crutches. Turn on the lights when you go into a dark area. Replace any light bulbs as soon as they burn out. Set up your furniture so you have a clear path. Avoid moving your furniture around. If any of your floors are uneven, fix them. If there are any pets around you, be aware of where they are. Review your medicines with your doctor. Some medicines can make you feel dizzy. This can increase your chance of falling. Ask your doctor what other things that you can do to help prevent falls. This information is not intended to replace advice given to you by your health care provider. Make sure you discuss any questions you have with your health care provider. Document Released: 12/27/2008 Document Revised: 08/08/2015 Document Reviewed: 04/06/2014 Elsevier Interactive Patient Education  2017 [employer].

## 2022-10-06 NOTE — Progress Notes (Signed)
Patient ID: Natalie Rice, female    DOB: 1955/09/21, 67 y.o.   MRN: [id number]  This visit was conducted in person.  BP (!) 154/78   Pulse 81   Temp 97.6 F (36.4 C) (Temporal)   Ht 4' 11.25" (1.505 m)   Wt 181 lb 6 oz (82.3 kg)   LMP 12/26/2011   SpO2 99%   BMI 36.32 kg/m    CC:  Chief Complaint  Patient presents with   Annual Exam    Part 2    Subjective:   HPI: Natalie Rice is a 67 y.o. female presenting on 10/06/2022 for Annual Exam (Part 2)  The patient presents for complete physical and review of chronic health problems. He/She also has the following acute concerns today:   Breast cancer in situ s/p  lumpectomy and radia tion  Onc Dr. Mosetta Putt  Planning anastrozole start.  Hip pain Result Date: 09/01/2021 AP pelvis lateral view of the left hip: No acute fractures.  Bilateral hips well located.  Hip joints well-maintained bilaterally.   XR Lumbar Spine 2-3 Views   Result Date: 09/01/2021 Lumbar spine 2 views: No acute fractures.  Slight loss of disc space at L4-5.  Grade 1 spondylolisthesis L4-5.  Normal lordotic curvature.  Otherwise remainder lumbar spine is well-preserved.   Diabetes:  On ozempic 0.5 now in last week. Did not have weight loss on lower dose, also no SE.  Lab Results  Component Value Date   HGBA1C 6.3 09/29/2022  Using medications without difficulties: Hypoglycemic episodes: Hyperglycemic episodes: Feet problems: non ulcers Blood Sugars averaging: not checking. eye exam within last year: yes Wt Readings from Last 3 Encounters:  10/06/22 181 lb 6 oz (82.3 kg)  10/06/22 177 lb 9.6 oz (80.6 kg)  05/21/22 178 lb 3 oz (80.8 kg)     Hypertension:   Good control on losartan hydrochlorothiazide 50/12.5 mg 1 tablet p.o. daily BP Readings from Last 3 Encounters:  10/06/22 (!) 154/78  10/06/22 (!) 152/80  05/21/22 (!) 153/86  Using medication without problems or lightheadedness:  none Chest pain with exertion: none Edema: none Short  of breath: none Average home BPs: not checking Other issues:  Elevated Cholesterol: LDL not at goal less than 756 despite change from pravastatin to Lipitor 20 mg daily. LDL is improved from 148 to 129. Lab Results  Component Value Date   CHOL 211 (H) 09/29/2022   HDL 54.20 09/29/2022   LDLCALC 129 (H) 09/29/2022   TRIG 140.0 09/29/2022   CHOLHDL 4 09/29/2022  The 10-year ASCVD risk score (Arnett DK, et al., 2019) is: 32.7%   Values used to calculate the score:     Age: 37 years     Sex: Female     Is Non-Hispanic African American: Yes     Diabetic: Yes     Tobacco smoker: No     Systolic Blood Pressure: 154 mmHg     Is BP treated: Yes     HDL Cholesterol: 54.2 mg/dL     Total Cholesterol: 211 mg/dL  Using medications without problems: Muscle aches:  Diet compliance: no meat Exercise: Now going to gym several days a week. Other complaints:  Wt Readings from Last 3 Encounters:  10/06/22 181 lb 6 oz (82.3 kg)  10/06/22 177 lb 9.6 oz (80.6 kg)  05/21/22 178 lb 3 oz (80.8 kg)   Gout:    no flares Lab Results  Component Value Date   LABURIC 7.0 05/24/2020  Relevant past medical, surgical, family and social history reviewed and updated as indicated. Interim medical history since our last visit reviewed. Allergies and medications reviewed and updated. Outpatient Medications Prior to Visit  Medication Sig Dispense Refill   anastrozole (ARIMIDEX) 1 MG tablet TAKE 1 TABLET BY MOUTH DAILY 100 tablet 2   Apoaequorin (PREVAGEN PO) Take 1 tablet by mouth in the morning.     atorvastatin (LIPITOR) 20 MG tablet Take 1 tablet (20 mg total) by mouth daily. 90 tablet 3   Cholecalciferol (VITAMIN D-3) 125 MCG (5000 UT) TABS Take 5,000 Units by mouth in the morning.     clotrimazole-betamethasone (LOTRISONE) cream Apply 1 Application topically daily. 30 g 0   Coenzyme Q10 (COQ10) 200 MG CAPS Take 200 mg by mouth in the morning.     Insulin Pen Needle (NOVOFINE PLUS PEN NEEDLE) 32G X  4 MM MISC Use to inject Ozempic once a week 30 each 0   Loratadine 10 MG CAPS Take 10 mg by mouth daily as needed (seasonal allergies.).     losartan-hydrochlorothiazide (HYZAAR) 50-12.5 MG tablet TAKE 1 TABLET BY MOUTH DAILY 100 tablet 0   Magnesium 250 MG TABS Take 250 mg by mouth in the morning.     Omega-3 Fatty Acids (OMEGA 3 PO) Take 1 capsule by mouth daily.     Potassium 99 MG TABS Take 99 mg by mouth in the morning.     Semaglutide,0.25 or 0.5MG /DOS, (OZEMPIC, 0.25 OR 0.5 MG/DOSE,) 2 MG/3ML SOPN Inject 0.5 mg into the skin once a week. 3 mL 11   No facility-administered medications prior to visit.     Per HPI unless specifically indicated in ROS section below Review of Systems  Constitutional:  Negative for fatigue and fever.  HENT:  Negative for congestion.   Eyes:  Negative for pain.  Respiratory:  Negative for cough and shortness of breath.   Cardiovascular:  Negative for chest pain, palpitations and leg swelling.  Gastrointestinal:  Negative for abdominal pain.  Genitourinary:  Negative for dysuria and vaginal bleeding.  Musculoskeletal:  Negative for back pain.  Neurological:  Negative for syncope, light-headedness and headaches.  Psychiatric/Behavioral:  Negative for dysphoric mood.    Objective:  BP (!) 154/78   Pulse 81   Temp 97.6 F (36.4 C) (Temporal)   Ht 4' 11.25" (1.505 m)   Wt 181 lb 6 oz (82.3 kg)   LMP 12/26/2011   SpO2 99%   BMI 36.32 kg/m   Wt Readings from Last 3 Encounters:  10/06/22 181 lb 6 oz (82.3 kg)  10/06/22 177 lb 9.6 oz (80.6 kg)  05/21/22 178 lb 3 oz (80.8 kg)      Physical Exam Vitals and nursing note reviewed.  Constitutional:      General: She is not in acute distress.    Appearance: Normal appearance. She is well-developed. She is not ill-appearing or toxic-appearing.  HENT:     Head: Normocephalic.     Right Ear: Hearing, tympanic membrane, ear canal and external ear normal.     Left Ear: Hearing, tympanic membrane, ear  canal and external ear normal.     Nose: Nose normal.  Eyes:     General: Lids are normal. Lids are everted, no foreign bodies appreciated.     Conjunctiva/sclera: Conjunctivae normal.     Pupils: Pupils are equal, round, and reactive to light.  Neck:     Thyroid: No thyroid mass or thyromegaly.     Vascular: No carotid  bruit.     Trachea: Trachea normal.  Cardiovascular:     Rate and Rhythm: Normal rate and regular rhythm.     Heart sounds: Normal heart sounds, S1 normal and S2 normal. No murmur heard.    No gallop.  Pulmonary:     Effort: Pulmonary effort is normal. No respiratory distress.     Breath sounds: Normal breath sounds. No wheezing, rhonchi or rales.  Abdominal:     General: Bowel sounds are normal. There is no distension or abdominal bruit.     Palpations: Abdomen is soft. There is no fluid wave or mass.     Tenderness: There is no abdominal tenderness. There is no guarding or rebound.     Hernia: No hernia is present.  Musculoskeletal:     Cervical back: Normal range of motion and neck supple.  Lymphadenopathy:     Cervical: No cervical adenopathy.  Skin:    General: Skin is warm and dry.     Findings: No rash.  Neurological:     Mental Status: She is alert.     Cranial Nerves: No cranial nerve deficit.     Sensory: No sensory deficit.  Psychiatric:        Mood and Affect: Mood is not anxious or depressed.        Speech: Speech normal.        Behavior: Behavior normal. Behavior is cooperative.        Judgment: Judgment normal.    Diabetic foot exam: Normal inspection No skin breakdown No calluses  Normal DP pulses Normal sensation to light touch and monofilament Nails normal     Results for orders placed or performed in visit on 10/06/22  HM DIABETES FOOT EXAM  Result Value Ref Range   HM Diabetic Foot Exam done      COVID 19 screen:  No recent travel or known exposure to COVID19 The patient denies respiratory symptoms of COVID 19 at this  time. The importance of social distancing was discussed today.   Assessment and Plan   The patient's preventative maintenance and recommended screening tests for an annual wellness exam were reviewed in full today. Brought up to date unless services declined.  Counselled on the importance of diet, exercise, and its role in overall health and mortality. The patient's FH and SH was reviewed, including their home life, tobacco status, and drug and alcohol status.   Vaccines:uptodate with td, COVID x 3 and flu...  Update  with PNA. Colon: 12/2021 repeat in 7 years Mammo 05/2022.. plans to schedule. PAP/DVE:  Nml, neg HPV 06/2013, repeat in 2020  no history of abnormals.  04/2019 normal  Bone density: Normal 06/2021, repeat in 5 years Nonsmoker  Hep C: done  HIV:  refused   Problem List Items Addressed This Visit     Diabetes mellitus without complication (HCC) (Chronic)    Chronic, excellent improvement with addition of Ozempic.  Now on higher dose Ozempic 0.5 mg weekly in the last 2 weeks.  Continue to work on low carbohydrate heart healthy diet.      Hypercalcemia    Acute, she does take multiple supplements that could contain calcium.  She will stop these and we will reevaluate in 6 months.      Hyperlipidemia associated with type 2 diabetes mellitus (HCC) (Chronic)    Chronic, inadequate control on atorvastatin 20 mg p.o. daily but significant improvement in the last 6 months.  She will continue working on low-cholesterol diet and  hopefully her numbers will continue to improve at 61-month check after weight loss with Ozempic.      Hypertension associated with diabetes (HCC) (Chronic)    Chronic, above goal on losartan 50/12.5 mg daily.  We will have her increase to 100/25 mg p.o. daily she will follow her blood pressure at home.  Hopefully she will be able to decrease back to the lower dose as she loses weight on Ozempic.      Obesity, Class II, BMI 35-39.9, no comorbidity (HCC)  (Chronic)    Wt Readings from Last 3 Encounters:  10/06/22 181 lb 6 oz (82.3 kg)  10/06/22 177 lb 9.6 oz (80.6 kg)  05/21/22 178 lb 3 oz (80.8 kg)   Body mass index is 36.32 kg/m.  Chronic, poor control Encouraged exercise, weight loss, healthy eating habits. Ozempic 0.5 mg weekly       Other Visit Diagnoses     Routine general medical examination at a health care facility    -  Primary         Kerby Nora, MD

## 2022-10-06 NOTE — Assessment & Plan Note (Signed)
Chronic, excellent improvement with addition of Ozempic.  Now on higher dose Ozempic 0.5 mg weekly in the last 2 weeks.  Continue to work on low carbohydrate heart healthy diet.

## 2022-10-08 ENCOUNTER — Telehealth: Payer: Self-pay | Admitting: Family Medicine

## 2022-10-08 NOTE — Telephone Encounter (Signed)
Natalie Rice notified as instructed by telephone.  Patient ask that I put this information in her AVS and send it to her.  Updated and printed to mail to patient.

## 2022-10-08 NOTE — Telephone Encounter (Signed)
Patient called in stating that after looking over her after visit summary from her cpe on 10/06/2022,her weight wasn't documented right.She would like for it to be changed to the weight that she was told when checked with her AWV with [place]. Also she would ,ike to know what Dr Ermalene Searing wants her b/p goal to be?She said in her paperwork it says  <140/90,but that doesn't make sense to her. Please advise.

## 2022-10-08 NOTE — Telephone Encounter (Signed)
Weight Updated as requested.  Please review BP goal question for clearer clarification I can provide to patient.

## 2022-10-08 NOTE — Telephone Encounter (Signed)
Technically blood pressure goal is systolic 90-140 and diastolic 96-04.  Above this is where we would adjust medication.  She she can work on [employer] management lifestyle changes to get blood pressure even better controlled around 120s/ 70s as a personal goal.

## 2022-10-15 ENCOUNTER — Telehealth: Payer: Self-pay | Admitting: *Deleted

## 2022-10-15 ENCOUNTER — Other Ambulatory Visit: Payer: Self-pay | Admitting: Family Medicine

## 2022-10-15 DIAGNOSIS — Z853 Personal history of malignant neoplasm of breast: Secondary | ICD-10-CM | POA: Diagnosis not present

## 2022-10-15 LAB — HM MAMMOGRAPHY

## 2022-10-15 NOTE — Telephone Encounter (Signed)
Physician order for diagnostic mammogram signed by Dr Mosetta Putt & faxed to [facility], [phone], fax confirmation received.

## 2022-11-16 NOTE — Progress Notes (Deleted)
Patient Care Team: Excell Seltzer, MD as PCP - General (Family Medicine) Donnelly Angelica, RN as Oncology Nurse Navigator Pershing Proud, RN as Oncology Nurse Navigator Malachy Mood, MD as Consulting Physician (Medical Oncology) Antony Blackbird, MD as Consulting Physician (Radiation Oncology) Abigail Miyamoto, MD as Consulting Physician (General Surgery) Pollyann Samples, NP as Nurse Practitioner (Nurse Practitioner)   CHIEF COMPLAINT: Follow up right breast DCIS  Oncology History Overview Note   Cancer Staging  Ductal carcinoma in situ (DCIS) of right breast Staging form: Breast, AJCC 8th Edition - Clinical stage from 03/12/2021: Stage 0 (cTis (DCIS), cN0, cM0, ER+, PR+) - Signed by Malachy Mood, MD on 03/12/2021 Stage prefix: Initial diagnosis Nuclear grade: G3 - Pathologic stage from 03/27/2021: Stage Unknown (pTis (DCIS), pNX, cM0, G3, ER+, PR+, HER2: Not Assessed) - Signed by Malachy Mood, MD on 05/23/2021 Stage prefix: Initial diagnosis Histologic grading system: 3 grade system Residual tumor (R): R0 - None     Ductal carcinoma in situ (DCIS) of right breast  02/12/2021 Mammogram   Exam: 3D Mammogram Diagnotic - Right  IMPRESSION: The 1.2 cm grouped fine linear calcifications in the right breast are suspicious.   02/26/2021 Initial Biopsy   Diagnosis Breast, right, needle core biopsy, 9 o'clock, 5.5 cmfn - DUCTAL CARCINOMA IN SITU WITH NECROSIS AND CALCIFICATIONS - SEE COMMENT Microscopic Comment Based on the biopsy, the ductal carcinoma in situ has a comedo pattern, high nuclear grade and measures 0.1 cm in greatest linear extent.  PROGNOSTIC INDICATORS Results: Estrogen Receptor: 90%, POSITIVE, MODERATE STAINING INTENSITY Progesterone Receptor: 40%, POSITIVE, STRONG-MODERATE STAINING INTENSITY   03/05/2021 Initial Diagnosis   Ductal carcinoma in situ (DCIS) of right breast   03/12/2021 Cancer Staging   Staging form: Breast, AJCC 8th Edition - Clinical stage from  03/12/2021: Stage 0 (cTis (DCIS), cN0, cM0, ER+, PR+) - Signed by Malachy Mood, MD on 03/12/2021 Stage prefix: Initial diagnosis Nuclear grade: G3   03/27/2021 Cancer Staging   Staging form: Breast, AJCC 8th Edition - Pathologic stage from 03/27/2021: Stage Unknown (pTis (DCIS), pNX, cM0, G3, ER+, PR+, HER2: Not Assessed) - Signed by Malachy Mood, MD on 05/23/2021 Stage prefix: Initial diagnosis Histologic grading system: 3 grade system Residual tumor (R): R0 - None    Definitive Surgery   FINAL MICROSCOPIC DIAGNOSIS:   A. BREAST, RIGHT, LUMPECTOMY:  -  Ductal carcinoma in situ (DCIS), grade III (high), with central  ("comedo") necrosis and calcifications, 8 mm in greatest dimension.  -  Resected edges negative for DCIS, distance from DCIS to closest edge 7 mm, anterior.  -  Negative for invasive carcinoma.  -  Seed implants with foreign body reaction and inflammation.   B. BREAST, RIGHT NEW ANTERIOR MARGIN, EXCISION:  -  Benign breast tissue with mild cystic apocrine metaplasia.  -  Negative for DCIS and invasive carcinoma.    05/01/2021 - 05/29/2021 Radiation Therapy    Site Technique Total Dose (Gy) Dose per Fx (Gy) Completed Fx Beam Energies  Breast, Right: Breast_R 3D 40.05/40.05 2.67 15/15 6XFFF, 10XFFF  Breast, Right: Breast_R_Bst 3D 10/10 2 5/5 6X, 10X     08/20/2021 Survivorship   SCP delivered by [name], NP      CURRENT THERAPY: Anastrozole, starting 11/2021  INTERVAL HISTORY Ms. Natalie Rice returns for follow up as scheduled. Last seen by me 05/21/22. Mammo 10/15/22 was negative   ROS   Past Medical History:  Diagnosis Date   Allergy    seasonal   Breast  cancer (HCC) 03/27/2021   right side lumpectemy   Colon polyp    Diabetes mellitus without complication (HCC)    Eosinophilia 03/03/2012   Hay fever    High blood pressure    High cholesterol    History of radiation therapy    right breast 05/01/2021-05/29/2021  Dr Antony Blackbird   Hyperlipidemia    Sleep apnea     uses mouthguard     Past Surgical History:  Procedure Laterality Date   BREAST LUMPECTOMY WITH RADIOACTIVE SEED LOCALIZATION Right 03/27/2021   Procedure: RIGHT BREAST LUMPECTOMY WITH RADIOACTIVE SEED LOCALIZATION;  Surgeon: Abigail Miyamoto, MD;  Location: [facility] OR;  Service: General;  Laterality: Right;   COLONOSCOPY  2012   no polyps   TUBAL LIGATION       Outpatient Encounter Medications as of 11/19/2022  Medication Sig   anastrozole (ARIMIDEX) 1 MG tablet TAKE 1 TABLET BY MOUTH DAILY   Apoaequorin (PREVAGEN PO) Take 1 tablet by mouth in the morning.   atorvastatin (LIPITOR) 20 MG tablet TAKE 1 TABLET BY MOUTH DAILY   Cholecalciferol (VITAMIN D-3) 125 MCG (5000 UT) TABS Take 5,000 Units by mouth in the morning.   clotrimazole-betamethasone (LOTRISONE) cream Apply 1 Application topically daily.   Coenzyme Q10 (COQ10) 200 MG CAPS Take 200 mg by mouth in the morning.   Insulin Pen Needle (NOVOFINE PLUS PEN NEEDLE) 32G X 4 MM MISC Use to inject Ozempic once a week   Loratadine 10 MG CAPS Take 10 mg by mouth daily as needed (seasonal allergies.).   losartan-hydrochlorothiazide (HYZAAR) 50-12.5 MG tablet TAKE 1 TABLET BY MOUTH DAILY   Magnesium 250 MG TABS Take 250 mg by mouth in the morning.   Omega-3 Fatty Acids (OMEGA 3 PO) Take 1 capsule by mouth daily.   Potassium 99 MG TABS Take 99 mg by mouth in the morning.   Semaglutide,0.25 or 0.5MG /DOS, (OZEMPIC, 0.25 OR 0.5 MG/DOSE,) 2 MG/3ML SOPN Inject 0.5 mg into the skin once a week.   No facility-administered encounter medications on file as of 11/19/2022.     There were no vitals filed for this visit. There is no height or weight on file to calculate BMI.   PHYSICAL EXAM GENERAL:alert, no distress and comfortable SKIN: no rash  EYES: sclera clear NECK: without mass LYMPH:  no palpable cervical or supraclavicular lymphadenopathy  LUNGS: clear with normal breathing effort HEART: regular rate & rhythm, no lower extremity  edema ABDOMEN: abdomen soft, non-tender and normal bowel sounds NEURO: alert & oriented x 3 with fluent speech, no focal motor/sensory deficits Breast exam:  PAC without erythema    CBC    Component Value Date/Time   WBC 5.4 05/21/2022 0943   WBC 9.3 09/15/2012 1229   RBC 5.26 (H) 05/21/2022 0943   HGB 15.2 (H) 05/21/2022 0943   HGB 13.6 09/10/2012 0704   HCT 44.7 05/21/2022 0943   HCT 41.2 09/10/2012 0704   PLT 321 05/21/2022 0943   PLT 278 09/10/2012 0704   MCV 85.0 05/21/2022 0943   MCV 85 09/10/2012 0704   MCH 28.9 05/21/2022 0943   MCHC 34.0 05/21/2022 0943   RDW 12.9 05/21/2022 0943   RDW 13.6 09/10/2012 0704   LYMPHSABS 1.6 05/21/2022 0943   LYMPHSABS 2.4 09/10/2012 0704   MONOABS 0.6 05/21/2022 0943   MONOABS 0.7 09/10/2012 0704   EOSABS 0.3 05/21/2022 0943   EOSABS 0.4 09/10/2012 0704   BASOSABS 0.1 05/21/2022 0943   BASOSABS 0.1 09/10/2012 0704  CMP     Component Value Date/Time   NA 137 09/29/2022 0829   NA 138 09/10/2012 0704   K 3.7 09/29/2022 0829   K 3.7 09/10/2012 0704   CL 102 09/29/2022 0829   CL 104 09/10/2012 0704   CO2 27 09/29/2022 0829   CO2 28 09/10/2012 0704   GLUCOSE 106 (H) 09/29/2022 0829   GLUCOSE 89 09/10/2012 0704   BUN 12 09/29/2022 0829   BUN 20 (H) 09/10/2012 0704   CREATININE 1.00 09/29/2022 0829   CREATININE 1.00 05/21/2022 0943   CREATININE 0.98 09/10/2012 0704   CALCIUM 10.9 (H) 09/29/2022 0829   CALCIUM 9.7 09/10/2012 0704   PROT 7.4 09/29/2022 0829   PROT 8.3 (H) 03/23/2012 1536   ALBUMIN 4.4 09/29/2022 0829   ALBUMIN 4.2 03/23/2012 1536   AST 29 09/29/2022 0829   AST 19 05/21/2022 0943   ALT 35 09/29/2022 0829   ALT 23 05/21/2022 0943   ALT 36 03/23/2012 1536   ALKPHOS 100 09/29/2022 0829   ALKPHOS 91 03/23/2012 1536   BILITOT 0.6 09/29/2022 0829   BILITOT 0.6 05/21/2022 0943   GFRNONAA >60 05/21/2022 0943   GFRNONAA >60 09/10/2012 0704   GFRAA >60 09/10/2012 0704     ASSESSMENT & PLAN:Natalie Rice  is a 67 y.o. female with     1. Right breast DCIS, grade 3, ER+/PR+ -Diagnosed 01/23/21. S/p right lumpectomy on 03/27/21 and adjuvant radiation under Dr. Roselind Messier, 05/01/21 - 05/29/21 -she started adjuvant anastrozole in 01/2022, she has been tolerating well with mild joint stiffness after exercise and moderate of hot flashes. SE's are tolerable -mammogram 10/15/22 was negative   2. Bone Health -baseline DEXA on 07/07/21 was borderline normal, with T-score of -1.0 at left femoral neck. -Continue calcium, vitamin D, and weightbearing exercise   3.  Age-appropriate health maintenance -Reportedly up-to-date on age-appropriate cancer screenings -On Ozempic for weight loss, follow-up PCP        PLAN:  No orders of the defined types were placed in this encounter.     All questions were answered. The patient knows to call the clinic with any problems, questions or concerns. No barriers to learning were detected. I spent *** counseling the patient face to face. The total time spent in the appointment was *** and more than 50% was on counseling, review of test results, and coordination of care.   [name], NP-C @DATE @

## 2022-11-19 ENCOUNTER — Other Ambulatory Visit: Payer: Medicare Other

## 2022-11-19 ENCOUNTER — Ambulatory Visit: Payer: Medicare Other | Admitting: Nurse Practitioner

## 2022-11-22 NOTE — Progress Notes (Unsigned)
Patient Care Team: Natalie Seltzer, MD as PCP - General (Family Medicine) Natalie Angelica, RN as Oncology Nurse Navigator Pershing Proud, RN as Oncology Nurse Navigator Natalie Mood, MD as Consulting Physician (Medical Oncology) Natalie Blackbird, MD as Consulting Physician (Radiation Oncology) Natalie Miyamoto, MD as Consulting Physician (General Surgery) Natalie Samples, NP as Nurse Practitioner (Nurse Practitioner)   CHIEF COMPLAINT: Right breast DCIS  Oncology History Overview Note   Cancer Staging  Ductal carcinoma in situ (DCIS) of right breast Staging form: Breast, AJCC 8th Edition - Clinical stage from 03/12/2021: Stage 0 (cTis (DCIS), cN0, cM0, ER+, PR+) - Signed by Natalie Mood, MD on 03/12/2021 Stage prefix: Initial diagnosis Nuclear grade: G3 - Pathologic stage from 03/27/2021: Stage Unknown (pTis (DCIS), pNX, cM0, G3, ER+, PR+, HER2: Not Assessed) - Signed by Natalie Mood, MD on 05/23/2021 Stage prefix: Initial diagnosis Histologic grading system: 3 grade system Residual tumor (R): R0 - None     Ductal carcinoma in situ (DCIS) of right breast  02/12/2021 Mammogram   Exam: 3D Mammogram Diagnotic - Right  IMPRESSION: The 1.2 cm grouped fine linear calcifications in the right breast are suspicious.   02/26/2021 Initial Biopsy   Diagnosis Breast, right, needle core biopsy, 9 o'clock, 5.5 cmfn - DUCTAL CARCINOMA IN SITU WITH NECROSIS AND CALCIFICATIONS - SEE COMMENT Microscopic Comment Based on the biopsy, the ductal carcinoma in situ has a comedo pattern, high nuclear grade and measures 0.1 cm in greatest linear extent.  PROGNOSTIC INDICATORS Results: Estrogen Receptor: 90%, POSITIVE, MODERATE STAINING INTENSITY Progesterone Receptor: 40%, POSITIVE, STRONG-MODERATE STAINING INTENSITY   03/05/2021 Initial Diagnosis   Ductal carcinoma in situ (DCIS) of right breast   03/12/2021 Cancer Staging   Staging form: Breast, AJCC 8th Edition - Clinical stage from 03/12/2021:  Stage 0 (cTis (DCIS), cN0, cM0, ER+, PR+) - Signed by Natalie Mood, MD on 03/12/2021 Stage prefix: Initial diagnosis Nuclear grade: G3   03/27/2021 Cancer Staging   Staging form: Breast, AJCC 8th Edition - Pathologic stage from 03/27/2021: Stage Unknown (pTis (DCIS), pNX, cM0, G3, ER+, PR+, HER2: Not Assessed) - Signed by Natalie Mood, MD on 05/23/2021 Stage prefix: Initial diagnosis Histologic grading system: 3 grade system Residual tumor (R): R0 - None    Definitive Surgery   FINAL MICROSCOPIC DIAGNOSIS:   A. BREAST, RIGHT, LUMPECTOMY:  -  Ductal carcinoma in situ (DCIS), grade III (high), with central  ("comedo") necrosis and calcifications, 8 mm in greatest dimension.  -  Resected edges negative for DCIS, distance from DCIS to closest edge 7 mm, anterior.  -  Negative for invasive carcinoma.  -  Seed implants with foreign body reaction and inflammation.   B. BREAST, RIGHT NEW ANTERIOR MARGIN, EXCISION:  -  Benign breast tissue with mild cystic apocrine metaplasia.  -  Negative for DCIS and invasive carcinoma.    05/01/2021 - 05/29/2021 Radiation Therapy    Site Technique Total Dose (Gy) Dose per Fx (Gy) Completed Fx Beam Energies  Breast, Right: Breast_R 3D 40.05/40.05 2.67 15/15 6XFFF, 10XFFF  Breast, Right: Breast_R_Bst 3D 10/10 2 5/5 6X, 10X     08/20/2021 Survivorship   SCP delivered by Natalie Glad, NP      CURRENT THERAPY: Anastrozole, starting 11/2021  INTERVAL HISTORY Natalie Rice returns for follow up as scheduled. Last seen by me 05/21/22  ROS   Past Medical History:  Diagnosis Date   Allergy    seasonal   Breast cancer (HCC) 03/27/2021   right side  lumpectemy   Colon polyp    Diabetes mellitus without complication (HCC)    Eosinophilia 03/03/2012   Hay fever    High blood pressure    High cholesterol    History of radiation therapy    right breast 05/01/2021-05/29/2021  Dr Natalie Rice   Hyperlipidemia    Sleep apnea    uses mouthguard     Past Surgical  History:  Procedure Laterality Date   BREAST LUMPECTOMY WITH RADIOACTIVE SEED LOCALIZATION Right 03/27/2021   Procedure: RIGHT BREAST LUMPECTOMY WITH RADIOACTIVE SEED LOCALIZATION;  Surgeon: Natalie Miyamoto, MD;  Location: [facility] OR;  Service: General;  Laterality: Right;   COLONOSCOPY  2012   no polyps   TUBAL LIGATION       Outpatient Encounter Medications as of 11/24/2022  Medication Sig   anastrozole (ARIMIDEX) 1 MG tablet TAKE 1 TABLET BY MOUTH DAILY   Apoaequorin (PREVAGEN PO) Take 1 tablet by mouth in the morning.   atorvastatin (LIPITOR) 20 MG tablet TAKE 1 TABLET BY MOUTH DAILY   Cholecalciferol (VITAMIN D-3) 125 MCG (5000 UT) TABS Take 5,000 Units by mouth in the morning.   clotrimazole-betamethasone (LOTRISONE) cream Apply 1 Application topically daily.   Coenzyme Q10 (COQ10) 200 MG CAPS Take 200 mg by mouth in the morning.   Insulin Pen Needle (NOVOFINE PLUS PEN NEEDLE) 32G X 4 MM MISC Use to inject Ozempic once a week   Loratadine 10 MG CAPS Take 10 mg by mouth daily as needed (seasonal allergies.).   losartan-hydrochlorothiazide (HYZAAR) 50-12.5 MG tablet TAKE 1 TABLET BY MOUTH DAILY   Magnesium 250 MG TABS Take 250 mg by mouth in the morning.   Omega-3 Fatty Acids (OMEGA 3 PO) Take 1 capsule by mouth daily.   Potassium 99 MG TABS Take 99 mg by mouth in the morning.   Semaglutide,0.25 or 0.5MG /DOS, (OZEMPIC, 0.25 OR 0.5 MG/DOSE,) 2 MG/3ML SOPN Inject 0.5 mg into the skin once a week.   No facility-administered encounter medications on file as of 11/24/2022.     There were no vitals filed for this visit. There is no height or weight on file to calculate BMI.   PHYSICAL EXAM GENERAL:alert, no distress and comfortable SKIN: no rash  EYES: sclera clear NECK: without mass LYMPH:  no palpable cervical or supraclavicular lymphadenopathy  LUNGS: clear with normal breathing effort HEART: regular rate & rhythm, no lower extremity edema ABDOMEN: abdomen soft, non-tender and  normal bowel sounds NEURO: alert & oriented x 3 with fluent speech, no focal motor/sensory deficits Breast exam:  PAC without erythema    CBC    Component Value Date/Time   WBC 5.4 05/21/2022 0943   WBC 9.3 09/15/2012 1229   RBC 5.26 (H) 05/21/2022 0943   HGB 15.2 (H) 05/21/2022 0943   HGB 13.6 09/10/2012 0704   HCT 44.7 05/21/2022 0943   HCT 41.2 09/10/2012 0704   PLT 321 05/21/2022 0943   PLT 278 09/10/2012 0704   MCV 85.0 05/21/2022 0943   MCV 85 09/10/2012 0704   MCH 28.9 05/21/2022 0943   MCHC 34.0 05/21/2022 0943   RDW 12.9 05/21/2022 0943   RDW 13.6 09/10/2012 0704   LYMPHSABS 1.6 05/21/2022 0943   LYMPHSABS 2.4 09/10/2012 0704   MONOABS 0.6 05/21/2022 0943   MONOABS 0.7 09/10/2012 0704   EOSABS 0.3 05/21/2022 0943   EOSABS 0.4 09/10/2012 0704   BASOSABS 0.1 05/21/2022 0943   BASOSABS 0.1 09/10/2012 0704     CMP     Component  Value Date/Time   NA 137 09/29/2022 0829   NA 138 09/10/2012 0704   K 3.7 09/29/2022 0829   K 3.7 09/10/2012 0704   CL 102 09/29/2022 0829   CL 104 09/10/2012 0704   CO2 27 09/29/2022 0829   CO2 28 09/10/2012 0704   GLUCOSE 106 (H) 09/29/2022 0829   GLUCOSE 89 09/10/2012 0704   BUN 12 09/29/2022 0829   BUN 20 (H) 09/10/2012 0704   CREATININE 1.00 09/29/2022 0829   CREATININE 1.00 05/21/2022 0943   CREATININE 0.98 09/10/2012 0704   CALCIUM 10.9 (H) 09/29/2022 0829   CALCIUM 9.7 09/10/2012 0704   PROT 7.4 09/29/2022 0829   PROT 8.3 (H) 03/23/2012 1536   ALBUMIN 4.4 09/29/2022 0829   ALBUMIN 4.2 03/23/2012 1536   AST 29 09/29/2022 0829   AST 19 05/21/2022 0943   ALT 35 09/29/2022 0829   ALT 23 05/21/2022 0943   ALT 36 03/23/2012 1536   ALKPHOS 100 09/29/2022 0829   ALKPHOS 91 03/23/2012 1536   BILITOT 0.6 09/29/2022 0829   BILITOT 0.6 05/21/2022 0943   GFRNONAA >60 05/21/2022 0943   GFRNONAA >60 09/10/2012 0704   GFRAA >60 09/10/2012 0704     ASSESSMENT & PLAN:Natalie Rice is a 67 y.o. female with     1. Right  breast DCIS, grade 3, ER+/PR+ -Diagnosed 01/23/21. S/p right lumpectomy on 03/27/21 and adjuvant radiation under Dr. Roselind Messier, 05/01/21 - 05/29/21 -she started adjuvant anastrozole in 01/2022, she has been tolerating well with mild joint stiffness after exercise and moderate of hot flashes. SE's are tolerable -Mammogram 10/15/22 was benign ***    2. Bone Health -baseline DEXA on 07/07/21 was borderline normal, with T-score of -1.0 at left femoral neck. -Continue calcium, vitamin D, and weightbearing exercise   3.  Age-appropriate health maintenance -Reportedly up-to-date on age-appropriate cancer screenings -On Ozempic for weight loss, follow-up PCP        PLAN:  No orders of the defined types were placed in this encounter.     All questions were answered. The patient knows to call the clinic with any problems, questions or concerns. No barriers to learning were detected. I spent *** counseling the patient face to face. The total time spent in the appointment was *** and more than 50% was on counseling, review of test results, and coordination of care.   Natalie Glad, NP-C @DATE @

## 2022-11-23 ENCOUNTER — Other Ambulatory Visit: Payer: Self-pay

## 2022-11-23 DIAGNOSIS — D0511 Intraductal carcinoma in situ of right breast: Secondary | ICD-10-CM

## 2022-11-24 ENCOUNTER — Encounter: Payer: Self-pay | Admitting: Nurse Practitioner

## 2022-11-24 ENCOUNTER — Inpatient Hospital Stay: Payer: Medicare Other | Admitting: Nurse Practitioner

## 2022-11-24 ENCOUNTER — Inpatient Hospital Stay: Payer: Medicare Other | Attending: Nurse Practitioner

## 2022-11-24 VITALS — BP 123/63 | HR 71 | Temp 98.3°F | Resp 17 | Ht 59.0 in | Wt 174.0 lb

## 2022-11-24 DIAGNOSIS — D0511 Intraductal carcinoma in situ of right breast: Secondary | ICD-10-CM | POA: Insufficient documentation

## 2022-11-24 DIAGNOSIS — Z79811 Long term (current) use of aromatase inhibitors: Secondary | ICD-10-CM | POA: Diagnosis not present

## 2022-11-24 DIAGNOSIS — Z923 Personal history of irradiation: Secondary | ICD-10-CM | POA: Diagnosis not present

## 2022-11-24 LAB — CBC WITH DIFFERENTIAL (CANCER CENTER ONLY)
Abs Immature Granulocytes: 0.01 10*3/uL (ref 0.00–0.07)
Basophils Absolute: 0.1 10*3/uL (ref 0.0–0.1)
Basophils Relative: 1 %
Eosinophils Absolute: 0.3 10*3/uL (ref 0.0–0.5)
Eosinophils Relative: 6 %
HCT: 42.7 % (ref 36.0–46.0)
Hemoglobin: 14.2 g/dL (ref 12.0–15.0)
Immature Granulocytes: 0 %
Lymphocytes Relative: 28 %
Lymphs Abs: 1.5 10*3/uL (ref 0.7–4.0)
MCH: 28.4 pg (ref 26.0–34.0)
MCHC: 33.3 g/dL (ref 30.0–36.0)
MCV: 85.4 fL (ref 80.0–100.0)
Monocytes Absolute: 0.5 10*3/uL (ref 0.1–1.0)
Monocytes Relative: 10 %
Neutro Abs: 3 10*3/uL (ref 1.7–7.7)
Neutrophils Relative %: 55 %
Platelet Count: 352 10*3/uL (ref 150–400)
RBC: 5 MIL/uL (ref 3.87–5.11)
RDW: 13.1 % (ref 11.5–15.5)
WBC Count: 5.5 10*3/uL (ref 4.0–10.5)
nRBC: 0 % (ref 0.0–0.2)

## 2022-11-24 LAB — CMP (CANCER CENTER ONLY)
ALT: 25 U/L (ref 0–44)
AST: 18 U/L (ref 15–41)
Albumin: 4.2 g/dL (ref 3.5–5.0)
Alkaline Phosphatase: 96 U/L (ref 38–126)
Anion gap: 8 (ref 5–15)
BUN: 13 mg/dL (ref 8–23)
CO2: 29 mmol/L (ref 22–32)
Calcium: 10 mg/dL (ref 8.9–10.3)
Chloride: 102 mmol/L (ref 98–111)
Creatinine: 1.13 mg/dL — ABNORMAL HIGH (ref 0.44–1.00)
GFR, Estimated: 54 mL/min — ABNORMAL LOW (ref >60)
Glucose, Bld: 97 mg/dL (ref 70–99)
Potassium: 3.2 mmol/L — ABNORMAL LOW (ref 3.5–5.1)
Sodium: 139 mmol/L (ref 135–145)
Total Bilirubin: 0.6 mg/dL (ref 0.3–1.2)
Total Protein: 7.2 g/dL (ref 6.5–8.1)

## 2022-12-04 ENCOUNTER — Other Ambulatory Visit: Payer: Self-pay | Admitting: Family Medicine

## 2022-12-04 DIAGNOSIS — I152 Hypertension secondary to endocrine disorders: Secondary | ICD-10-CM

## 2023-03-16 ENCOUNTER — Telehealth: Payer: Self-pay | Admitting: *Deleted

## 2023-03-16 DIAGNOSIS — E1169 Type 2 diabetes mellitus with other specified complication: Secondary | ICD-10-CM

## 2023-03-16 DIAGNOSIS — E119 Type 2 diabetes mellitus without complications: Secondary | ICD-10-CM

## 2023-03-16 NOTE — Telephone Encounter (Signed)
-----   Message from Alvina Chou sent at 03/15/2023  4:08 PM EST ----- Regarding: lab orders for [facility] 1.16.25 Lab orders for a 6 month follow up appt

## 2023-04-01 ENCOUNTER — Other Ambulatory Visit: Payer: Medicare Other

## 2023-04-08 ENCOUNTER — Ambulatory Visit: Payer: Medicare Other | Admitting: Family Medicine

## 2023-05-02 ENCOUNTER — Other Ambulatory Visit: Payer: Self-pay | Admitting: Nurse Practitioner

## 2023-05-04 ENCOUNTER — Other Ambulatory Visit: Payer: Self-pay | Admitting: Family Medicine

## 2023-05-04 DIAGNOSIS — E1159 Type 2 diabetes mellitus with other circulatory complications: Secondary | ICD-10-CM

## 2023-05-24 NOTE — Progress Notes (Unsigned)
 Patient Care Team: Excell Seltzer, MD as PCP - General (Family Medicine) Donnelly Angelica, RN as Oncology Nurse Navigator Pershing Proud, RN as Oncology Nurse Navigator Malachy Mood, MD as Consulting Physician (Medical Oncology) Antony Blackbird, MD as Consulting Physician (Radiation Oncology) Abigail Miyamoto, MD as Consulting Physician (General Surgery) Pollyann Samples, NP as Nurse Practitioner (Nurse Practitioner)   CHIEF COMPLAINT: Follow up right breast DCIS   Oncology History Overview Note   Cancer Staging  Ductal carcinoma in situ (DCIS) of right breast Staging form: Breast, AJCC 8th Edition - Clinical stage from 03/12/2021: Stage 0 (cTis (DCIS), cN0, cM0, ER+, PR+) - Signed by Malachy Mood, MD on 03/12/2021 Stage prefix: Initial diagnosis Nuclear grade: G3 - Pathologic stage from 03/27/2021: Stage Unknown (pTis (DCIS), pNX, cM0, G3, ER+, PR+, HER2: Not Assessed) - Signed by Malachy Mood, MD on 05/23/2021 Stage prefix: Initial diagnosis Histologic grading system: 3 grade system Residual tumor (R): R0 - None     Ductal carcinoma in situ (DCIS) of right breast  02/12/2021 Mammogram   Exam: 3D Mammogram Diagnotic - Right  IMPRESSION: The 1.2 cm grouped fine linear calcifications in the right breast are suspicious.   02/26/2021 Initial Biopsy   Diagnosis Breast, right, needle core biopsy, 9 o'clock, 5.5 cmfn - DUCTAL CARCINOMA IN SITU WITH NECROSIS AND CALCIFICATIONS - SEE COMMENT Microscopic Comment Based on the biopsy, the ductal carcinoma in situ has a comedo pattern, high nuclear grade and measures 0.1 cm in greatest linear extent.  PROGNOSTIC INDICATORS Results: Estrogen Receptor: 90%, POSITIVE, MODERATE STAINING INTENSITY Progesterone Receptor: 40%, POSITIVE, STRONG-MODERATE STAINING INTENSITY   03/05/2021 Initial Diagnosis   Ductal carcinoma in situ (DCIS) of right breast   03/12/2021 Cancer Staging   Staging form: Breast, AJCC 8th Edition - Clinical stage from  03/12/2021: Stage 0 (cTis (DCIS), cN0, cM0, ER+, PR+) - Signed by Malachy Mood, MD on 03/12/2021 Stage prefix: Initial diagnosis Nuclear grade: G3   03/27/2021 Cancer Staging   Staging form: Breast, AJCC 8th Edition - Pathologic stage from 03/27/2021: Stage Unknown (pTis (DCIS), pNX, cM0, G3, ER+, PR+, HER2: Not Assessed) - Signed by Malachy Mood, MD on 05/23/2021 Stage prefix: Initial diagnosis Histologic grading system: 3 grade system Residual tumor (R): R0 - None    Definitive Surgery   FINAL MICROSCOPIC DIAGNOSIS:   A. BREAST, RIGHT, LUMPECTOMY:  -  Ductal carcinoma in situ (DCIS), grade III (high), with central  ("comedo") necrosis and calcifications, 8 mm in greatest dimension.  -  Resected edges negative for DCIS, distance from DCIS to closest edge 7 mm, anterior.  -  Negative for invasive carcinoma.  -  Seed implants with foreign body reaction and inflammation.   B. BREAST, RIGHT NEW ANTERIOR MARGIN, EXCISION:  -  Benign breast tissue with mild cystic apocrine metaplasia.  -  Negative for DCIS and invasive carcinoma.    05/01/2021 - 05/29/2021 Radiation Therapy    Site Technique Total Dose (Gy) Dose per Fx (Gy) Completed Fx Beam Energies  Breast, Right: Breast_R 3D 40.05/40.05 2.67 15/15 6XFFF, 10XFFF  Breast, Right: Breast_R_Bst 3D 10/10 2 5/5 6X, 10X     08/20/2021 Survivorship   SCP delivered by [name], NP      CURRENT THERAPY: Anastrozole, starting 11/2021  INTERVAL HISTORY Ms. Bringle returns for follow-up as scheduled, last seen by me 11/24/2022. Continues anastrozole, tolerating well. She gets stiff/sore after exercise but denies other joint pain. Hot flashes occurring less often. These are tolerable and denies other  side effects.  Denies concerns in her breast such as new lump/mass, nipple discharge or inversion, or skin change.  She recently got back from a long trip with her husband to [place], that she was drinking enough but may be not.  She did not have much to drink  yesterday and so far nothing today for this early morning visit.   ROS  All other systems reviewed and negative  Past Medical History:  Diagnosis Date   Allergy    seasonal   Breast cancer (HCC) 03/27/2021   right side lumpectemy   Colon polyp    Diabetes mellitus without complication (HCC)    Eosinophilia 03/03/2012   Hay fever    High blood pressure    High cholesterol    History of radiation therapy    right breast 05/01/2021-05/29/2021  Dr Antony Blackbird   Hyperlipidemia    Sleep apnea    uses mouthguard     Past Surgical History:  Procedure Laterality Date   BREAST LUMPECTOMY WITH RADIOACTIVE SEED LOCALIZATION Right 03/27/2021   Procedure: RIGHT BREAST LUMPECTOMY WITH RADIOACTIVE SEED LOCALIZATION;  Surgeon: Abigail Miyamoto, MD;  Location: [facility] OR;  Service: General;  Laterality: Right;   COLONOSCOPY  2012   no polyps   TUBAL LIGATION       Outpatient Encounter Medications as of 05/25/2023  Medication Sig   anastrozole (ARIMIDEX) 1 MG tablet TAKE 1 TABLET BY MOUTH DAILY   Apoaequorin (PREVAGEN PO) Take 1 tablet by mouth in the morning.   atorvastatin (LIPITOR) 20 MG tablet TAKE 1 TABLET BY MOUTH DAILY   Cholecalciferol (VITAMIN D-3) 125 MCG (5000 UT) TABS Take 5,000 Units by mouth in the morning.   clotrimazole-betamethasone (LOTRISONE) cream Apply 1 Application topically daily.   Coenzyme Q10 (COQ10) 200 MG CAPS Take 200 mg by mouth in the morning.   Insulin Pen Needle (NOVOFINE PLUS PEN NEEDLE) 32G X 4 MM MISC Use to inject Ozempic once a week   Loratadine 10 MG CAPS Take 10 mg by mouth daily as needed (seasonal allergies.).   losartan-hydrochlorothiazide (HYZAAR) 50-12.5 MG tablet TAKE 1 TABLET BY MOUTH DAILY   Magnesium 250 MG TABS Take 250 mg by mouth in the morning.   Omega-3 Fatty Acids (OMEGA 3 PO) Take 1 capsule by mouth daily.   Potassium 99 MG TABS Take 99 mg by mouth in the morning.   Semaglutide,0.25 or 0.5MG /DOS, (OZEMPIC, 0.25 OR 0.5 MG/DOSE,) 2 MG/3ML  SOPN Inject 0.5 mg into the skin once a week.   No facility-administered encounter medications on file as of 05/25/2023.     Today's Vitals   05/25/23 0940  BP: 130/64  Pulse: 63  Resp: 17  Temp: (!) 97.2 F (36.2 C)  TempSrc: Temporal  SpO2: 99%  Weight: 171 lb 6.4 oz (77.7 kg)  PainSc: 0-No pain   Body mass index is 34.62 kg/m.   PHYSICAL EXAM GENERAL:alert, no distress and comfortable SKIN: no rash  EYES: sclera clear NECK: without mass LYMPH:  no palpable cervical or supraclavicular lymphadenopathy  LUNGS:  normal breathing effort HEART: no lower extremity edema ABDOMEN: abdomen soft, non-tender and normal bowel sounds NEURO: alert & oriented x 3 with fluent speech, no focal motor/sensory deficits Breast exam: S/p right lumpectomy, incisions completely healed with mild scar tissue.  No palpable mass or nodularity in either breast or axilla that I could appreciate.   CBC    Component Value Date/Time   WBC 7.6 05/25/2023 0846   WBC 9.3 09/15/2012  1229   RBC 5.05 05/25/2023 0846   HGB 14.3 05/25/2023 0846   HGB 13.6 09/10/2012 0704   HCT 43.5 05/25/2023 0846   HCT 41.2 09/10/2012 0704   PLT 317 05/25/2023 0846   PLT 278 09/10/2012 0704   MCV 86.1 05/25/2023 0846   MCV 85 09/10/2012 0704   MCH 28.3 05/25/2023 0846   MCHC 32.9 05/25/2023 0846   RDW 13.2 05/25/2023 0846   RDW 13.6 09/10/2012 0704   LYMPHSABS 1.5 05/25/2023 0846   LYMPHSABS 2.4 09/10/2012 0704   MONOABS 0.6 05/25/2023 0846   MONOABS 0.7 09/10/2012 0704   EOSABS 0.5 05/25/2023 0846   EOSABS 0.4 09/10/2012 0704   BASOSABS 0.1 05/25/2023 0846   BASOSABS 0.1 09/10/2012 0704     CMP     Component Value Date/Time   NA 142 05/25/2023 0846   NA 138 09/10/2012 0704   K 3.9 05/25/2023 0846   K 3.7 09/10/2012 0704   CL 103 05/25/2023 0846   CL 104 09/10/2012 0704   CO2 31 05/25/2023 0846   CO2 28 09/10/2012 0704   GLUCOSE 99 05/25/2023 0846   GLUCOSE 89 09/10/2012 0704   BUN 18 05/25/2023  0846   BUN 20 (H) 09/10/2012 0704   CREATININE 1.36 (H) 05/25/2023 0846   CREATININE 0.98 09/10/2012 0704   CALCIUM 10.1 05/25/2023 0846   CALCIUM 9.7 09/10/2012 0704   PROT 7.1 05/25/2023 0846   PROT 8.3 (H) 03/23/2012 1536   ALBUMIN 4.3 05/25/2023 0846   ALBUMIN 4.2 03/23/2012 1536   AST 22 05/25/2023 0846   ALT 27 05/25/2023 0846   ALT 36 03/23/2012 1536   ALKPHOS 89 05/25/2023 0846   ALKPHOS 91 03/23/2012 1536   BILITOT 0.6 05/25/2023 0846   GFRNONAA 43 (L) 05/25/2023 0846   GFRNONAA >60 09/10/2012 0704   GFRAA >60 09/10/2012 0704     ASSESSMENT & PLAN:Waynesha L. Rosman is a 68 y.o. female with     1. Right breast DCIS, grade 3, ER+/PR+ -Diagnosed 01/23/21. S/p right lumpectomy on 03/27/21 and adjuvant radiation under Dr. Roselind Messier, 05/01/21 - 05/29/21 -she started adjuvant anastrozole in 01/2022, tolerating well with mild stiffness and hot flashes. SE's are tolerable -Mammogram 10/15/22 was negative -Ms. Riles is clinically doing well.  Exam is benign, labs are unremarkable.  Overall no clinical concern for recurrence. -Continue breast cancer surveillance and anastrozole.  Mammo 10/2023 -Follow-up in 6 months then annually, or sooner if needed   2. Bone Health -baseline DEXA on 07/07/21 was borderline normal, with T-score of -1.0 at left femoral neck. -Continue weightbearing exercise, not on oral calcium due to elevated levels periodically   3.  Age-appropriate health maintenance -Reportedly up-to-date on age-appropriate cancer screenings -On Ozempic for weight loss, follow-up PCP -She has mild renal dysfunction, I encouraged her to increase hydration and follow-up with PCP    PLAN: -Labs reviewed -Continue breast cancer surveillance and anastrozole -Mammogram and DEXA 10/2023 -Follow-up in 6 months, or sooner if needed  Orders Placed This Encounter  Procedures   MM DIAG BREAST TOMO BILATERAL    Standing Status:   Future    Expected Date:   10/16/2023    Expiration Date:    05/24/2024    Scheduling Instructions:     [name]    Reason for Exam (SYMPTOM  OR DIAGNOSIS REQUIRED):   h/o R DCIS    Preferred imaging location?:   External   DG Bone Density    Standing Status:   Future  Expected Date:   10/16/2023    Expiration Date:   05/24/2024    Scheduling Instructions:     [facility]    Reason for Exam (SYMPTOM  OR DIAGNOSIS REQUIRED):   osteopenia, on AI    Preferred imaging location?:   External      All questions were answered. The patient knows to call the clinic with any problems, questions or concerns. No barriers to learning were detected.   [name], NP-C 05/25/2023

## 2023-05-25 ENCOUNTER — Inpatient Hospital Stay: Payer: Medicare Other | Admitting: Nurse Practitioner

## 2023-05-25 ENCOUNTER — Telehealth: Payer: Self-pay | Admitting: *Deleted

## 2023-05-25 ENCOUNTER — Encounter: Payer: Self-pay | Admitting: Nurse Practitioner

## 2023-05-25 ENCOUNTER — Inpatient Hospital Stay: Payer: Medicare Other | Attending: Hematology

## 2023-05-25 VITALS — BP 130/64 | HR 63 | Temp 97.2°F | Resp 17 | Wt 171.4 lb

## 2023-05-25 DIAGNOSIS — Z79811 Long term (current) use of aromatase inhibitors: Secondary | ICD-10-CM | POA: Diagnosis not present

## 2023-05-25 DIAGNOSIS — N289 Disorder of kidney and ureter, unspecified: Secondary | ICD-10-CM | POA: Insufficient documentation

## 2023-05-25 DIAGNOSIS — D0511 Intraductal carcinoma in situ of right breast: Secondary | ICD-10-CM | POA: Diagnosis not present

## 2023-05-25 DIAGNOSIS — R232 Flushing: Secondary | ICD-10-CM | POA: Insufficient documentation

## 2023-05-25 DIAGNOSIS — Z923 Personal history of irradiation: Secondary | ICD-10-CM | POA: Insufficient documentation

## 2023-05-25 DIAGNOSIS — Z7985 Long-term (current) use of injectable non-insulin antidiabetic drugs: Secondary | ICD-10-CM | POA: Diagnosis not present

## 2023-05-25 LAB — CBC WITH DIFFERENTIAL (CANCER CENTER ONLY)
Abs Immature Granulocytes: 0.06 10*3/uL (ref 0.00–0.07)
Basophils Absolute: 0.1 10*3/uL (ref 0.0–0.1)
Basophils Relative: 1 %
Eosinophils Absolute: 0.5 10*3/uL (ref 0.0–0.5)
Eosinophils Relative: 6 %
HCT: 43.5 % (ref 36.0–46.0)
Hemoglobin: 14.3 g/dL (ref 12.0–15.0)
Immature Granulocytes: 1 %
Lymphocytes Relative: 20 %
Lymphs Abs: 1.5 10*3/uL (ref 0.7–4.0)
MCH: 28.3 pg (ref 26.0–34.0)
MCHC: 32.9 g/dL (ref 30.0–36.0)
MCV: 86.1 fL (ref 80.0–100.0)
Monocytes Absolute: 0.6 10*3/uL (ref 0.1–1.0)
Monocytes Relative: 8 %
Neutro Abs: 4.9 10*3/uL (ref 1.7–7.7)
Neutrophils Relative %: 64 %
Platelet Count: 317 10*3/uL (ref 150–400)
RBC: 5.05 MIL/uL (ref 3.87–5.11)
RDW: 13.2 % (ref 11.5–15.5)
WBC Count: 7.6 10*3/uL (ref 4.0–10.5)
nRBC: 0 % (ref 0.0–0.2)

## 2023-05-25 LAB — CMP (CANCER CENTER ONLY)
ALT: 27 U/L (ref 0–44)
AST: 22 U/L (ref 15–41)
Albumin: 4.3 g/dL (ref 3.5–5.0)
Alkaline Phosphatase: 89 U/L (ref 38–126)
Anion gap: 8 (ref 5–15)
BUN: 18 mg/dL (ref 8–23)
CO2: 31 mmol/L (ref 22–32)
Calcium: 10.1 mg/dL (ref 8.9–10.3)
Chloride: 103 mmol/L (ref 98–111)
Creatinine: 1.36 mg/dL — ABNORMAL HIGH (ref 0.44–1.00)
GFR, Estimated: 43 mL/min — ABNORMAL LOW (ref >60)
Glucose, Bld: 99 mg/dL (ref 70–99)
Potassium: 3.9 mmol/L (ref 3.5–5.1)
Sodium: 142 mmol/L (ref 135–145)
Total Bilirubin: 0.6 mg/dL (ref 0.0–1.2)
Total Protein: 7.1 g/dL (ref 6.5–8.1)

## 2023-05-25 NOTE — Telephone Encounter (Signed)
 Copied from CRM [id number]. Topic: General - Other >> May 25, 2023  8:18 AM Fredrich Romans wrote: Reason for CRM: Patient is having labs drawn today at her oncologist.She would like to know if she still has to have them drawn again on again for the appointment that she's scheduled for 05/26/2023 at [facility]?

## 2023-05-25 NOTE — Telephone Encounter (Signed)
 Maranatha notified by telephone that she will still need to keep her lab appointment here at [facility] because we still need to check her Lipid and Diabetes labs that the cancer center didn't check.  Patient states understanding .

## 2023-05-26 ENCOUNTER — Other Ambulatory Visit (INDEPENDENT_AMBULATORY_CARE_PROVIDER_SITE_OTHER): Payer: Medicare Other

## 2023-05-26 DIAGNOSIS — E785 Hyperlipidemia, unspecified: Secondary | ICD-10-CM

## 2023-05-26 DIAGNOSIS — E119 Type 2 diabetes mellitus without complications: Secondary | ICD-10-CM

## 2023-05-26 DIAGNOSIS — E1169 Type 2 diabetes mellitus with other specified complication: Secondary | ICD-10-CM

## 2023-05-26 DIAGNOSIS — D0511 Intraductal carcinoma in situ of right breast: Secondary | ICD-10-CM

## 2023-05-26 LAB — HEMOGLOBIN A1C: Hgb A1c MFr Bld: 6.2 % (ref 4.6–6.5)

## 2023-05-26 LAB — COMPREHENSIVE METABOLIC PANEL
ALT: 27 U/L (ref 0–35)
AST: 21 U/L (ref 0–37)
Albumin: 4.2 g/dL (ref 3.5–5.2)
Alkaline Phosphatase: 86 U/L (ref 39–117)
BUN: 14 mg/dL (ref 6–23)
CO2: 29 meq/L (ref 19–32)
Calcium: 10 mg/dL (ref 8.4–10.5)
Chloride: 101 meq/L (ref 96–112)
Creatinine, Ser: 1.26 mg/dL — ABNORMAL HIGH (ref 0.40–1.20)
GFR: 44.39 mL/min — ABNORMAL LOW (ref >60.00)
Glucose, Bld: 97 mg/dL (ref 70–99)
Potassium: 3.6 meq/L (ref 3.5–5.1)
Sodium: 140 meq/L (ref 135–145)
Total Bilirubin: 0.6 mg/dL (ref 0.2–1.2)
Total Protein: 7 g/dL (ref 6.0–8.3)

## 2023-05-26 LAB — LIPID PANEL
Cholesterol: 229 mg/dL — ABNORMAL HIGH (ref 0–200)
HDL: 53.7 mg/dL (ref >39.00)
LDL Cholesterol: 153 mg/dL — ABNORMAL HIGH (ref 0–99)
NonHDL: 175.24
Total CHOL/HDL Ratio: 4
Triglycerides: 111 mg/dL (ref 0.0–149.0)
VLDL: 22.2 mg/dL (ref 0.0–40.0)

## 2023-05-26 LAB — MICROALBUMIN / CREATININE URINE RATIO
Creatinine,U: 203.7 mg/dL
Microalb Creat Ratio: 6 mg/g (ref 0.0–30.0)
Microalb, Ur: 1.2 mg/dL (ref 0.0–1.9)

## 2023-05-27 ENCOUNTER — Other Ambulatory Visit: Payer: Self-pay | Admitting: Family Medicine

## 2023-05-27 ENCOUNTER — Encounter: Payer: Self-pay | Admitting: Family Medicine

## 2023-05-27 DIAGNOSIS — E2839 Other primary ovarian failure: Secondary | ICD-10-CM

## 2023-05-27 NOTE — Progress Notes (Signed)
 No critical labs need to be addressed urgently. We will discuss labs in detail at upcoming office visit.

## 2023-06-02 ENCOUNTER — Encounter: Payer: Self-pay | Admitting: Family Medicine

## 2023-06-02 ENCOUNTER — Ambulatory Visit (INDEPENDENT_AMBULATORY_CARE_PROVIDER_SITE_OTHER): Payer: Medicare Other | Admitting: Family Medicine

## 2023-06-02 VITALS — BP 132/74 | HR 75 | Temp 98.7°F | Ht 59.25 in | Wt 169.8 lb

## 2023-06-02 DIAGNOSIS — Z6834 Body mass index (BMI) 34.0-34.9, adult: Secondary | ICD-10-CM

## 2023-06-02 DIAGNOSIS — E785 Hyperlipidemia, unspecified: Secondary | ICD-10-CM | POA: Diagnosis not present

## 2023-06-02 DIAGNOSIS — N289 Disorder of kidney and ureter, unspecified: Secondary | ICD-10-CM | POA: Diagnosis not present

## 2023-06-02 DIAGNOSIS — Z7985 Long-term (current) use of injectable non-insulin antidiabetic drugs: Secondary | ICD-10-CM | POA: Diagnosis not present

## 2023-06-02 DIAGNOSIS — I152 Hypertension secondary to endocrine disorders: Secondary | ICD-10-CM | POA: Diagnosis not present

## 2023-06-02 DIAGNOSIS — E66812 Obesity, class 2: Secondary | ICD-10-CM

## 2023-06-02 DIAGNOSIS — E1169 Type 2 diabetes mellitus with other specified complication: Secondary | ICD-10-CM | POA: Diagnosis not present

## 2023-06-02 DIAGNOSIS — E119 Type 2 diabetes mellitus without complications: Secondary | ICD-10-CM

## 2023-06-02 DIAGNOSIS — E1159 Type 2 diabetes mellitus with other circulatory complications: Secondary | ICD-10-CM

## 2023-06-02 MED ORDER — SEMAGLUTIDE (1 MG/DOSE) 4 MG/3ML ~~LOC~~ SOPN: 1.0000 mg | PEN_INJECTOR | SUBCUTANEOUS | 11 refills | Status: DC

## 2023-06-02 NOTE — Assessment & Plan Note (Signed)
 New, possibly secondary to increase in BP med.  Decrease losartan hydrochlorothiazide back to 1 tablet daily of 50/12.5 mg daily.  Increase water intake.  Return in 2 weeks for recheck of kidney  function.

## 2023-06-02 NOTE — Patient Instructions (Addendum)
 Decrease losartan hydrochlorothiazide back to 1 tablet daily of 50/12.5 mg daily.  Increase water intake.  Return in 2 weeks for recheck of kidney  function.  Follow BP at home... vall if > 140/90 for additional medication.  Increase ozempic to 1 mg weekly.   Work on low cholesterol diet.

## 2023-06-02 NOTE — Assessment & Plan Note (Signed)
 Chronic, excellent control with ozempic.  She has noted some slowed weight loss in last few months   Increase ozempic to 1 mg weekly.   Re-eval in 6 months.

## 2023-06-02 NOTE — Assessment & Plan Note (Addendum)
 Given change in  creatinine .Marland Kitchen Will decrease losartan hydrochlorothiazide back to previous dose.   Follow BP.Marland Kitchen if consistently > 140/90.Marland Kitchen call for additional medication for BP.

## 2023-06-02 NOTE — Progress Notes (Signed)
 Patient ID: Natalie Rice, female    DOB: 02-28-1956, 68 y.o.   MRN: [id number]  This visit was conducted in person.  BP 132/74   Pulse 75   Temp 98.7 F (37.1 C) (Oral)   Ht 4' 11.25" (1.505 m)   Wt 169 lb 12.8 oz (77 kg)   LMP 12/26/2011   SpO2 95%   BMI 34.01 kg/m    CC:  Chief Complaint  Patient presents with   6 MONTH FU    Discuss labs     Subjective:   HPI: Natalie Rice is a 68 y.o. female presenting on 06/02/2023 for 6 MONTH FU (Discuss labs )   Breast cancer in situ s/p  lumpectomy and radia tion  Onc Dr. Mosetta Putt   On anastrozole on for 5 year total likely.   Diabetes:  Well controlled on  ozempic 0.5.  Has lost 5 lbs in last 6 months... weight loss slowed.  No N/V, no abd pain, no constipation. Lab Results  Component Value Date   HGBA1C 6.2 05/26/2023  Using medications without difficulties: Hypoglycemic episodes: Hyperglycemic episodes: Feet problems: non ulcers Blood Sugars averaging: not checking. eye exam within last year: yes Wt Readings from Last 3 Encounters:  06/02/23 169 lb 12.8 oz (77 kg)  05/25/23 171 lb 6.4 oz (77.7 kg)  11/24/22 174 lb (78.9 kg)     Hypertension:   Good control on losartan hydrochlorothiazide 100/25 mg 1 tablet p.o. daily but  appears there is an associated increase in creatinine since increasing dose. BP Readings from Last 3 Encounters:  06/02/23 132/74  05/25/23 130/64  11/24/22 123/63  Using medication without problems or lightheadedness:  none Chest pain with exertion: none Edema: none Short of breath: none Average home BPs: not checking Other issues:  Elevated Cholesterol: LDL not at goal less than 425 despite change from pravastatin to Lipitor 20 mg daily.  Recent trip to [place]  Tolerating well. Lab Results  Component Value Date   CHOL 229 (H) 05/26/2023   HDL 53.70 05/26/2023   LDLCALC 153 (H) 05/26/2023   TRIG 111.0 05/26/2023   CHOLHDL 4 05/26/2023  The 10-year ASCVD risk score (Arnett DK,  et al., 2019) is: 25.8%   Values used to calculate the score:     Age: 52 years     Sex: Female     Is Non-Hispanic African American: Yes     Diabetic: Yes     Tobacco smoker: No     Systolic Blood Pressure: 132 mmHg     Is BP treated: Yes     HDL Cholesterol: 53.7 mg/dL     Total Cholesterol: 229 mg/dL  Using medications without problems: Muscle aches:  Diet compliance: no meat Exercise: Now going to gym several days a week. Other complaints:  Wt Readings from Last 3 Encounters:  06/02/23 169 lb 12.8 oz (77 kg)  05/25/23 171 lb 6.4 oz (77.7 kg)  11/24/22 174 lb (78.9 kg)   Gout:    no flares Lab Results  Component Value Date   LABURIC 7.0 05/24/2020    Hypertension:    Well controlled on losartan hydrochlorothiazide 100.25 mg daily BP Readings from Last 3 Encounters:  06/02/23 132/74  05/25/23 130/64  11/24/22 123/63  Using medication without problems or lightheadedness:  Chest pain with exertion: Edema: Short of breath: Average home BPs: Other issues:  Relevant past medical, surgical, family and social history reviewed and updated as indicated. Interim medical history since our  last visit reviewed. Allergies and medications reviewed and updated. Outpatient Medications Prior to Visit  Medication Sig Dispense Refill   anastrozole (ARIMIDEX) 1 MG tablet TAKE 1 TABLET BY MOUTH DAILY 100 tablet 2   Apoaequorin (PREVAGEN PO) Take 1 tablet by mouth in the morning.     atorvastatin (LIPITOR) 20 MG tablet TAKE 1 TABLET BY MOUTH DAILY 100 tablet 2   Cholecalciferol (VITAMIN D-3) 125 MCG (5000 UT) TABS Take 5,000 Units by mouth in the morning.     clotrimazole-betamethasone (LOTRISONE) cream Apply 1 Application topically daily. 30 g 0   Coenzyme Q10 (COQ10) 200 MG CAPS Take 200 mg by mouth in the morning.     Insulin Pen Needle (NOVOFINE PLUS PEN NEEDLE) 32G X 4 MM MISC Use to inject Ozempic once a week 30 each 0   Loratadine 10 MG CAPS Take 10 mg by mouth daily as needed  (seasonal allergies.).     losartan-hydrochlorothiazide (HYZAAR) 50-12.5 MG tablet TAKE 1 TABLET BY MOUTH DAILY 100 tablet 2   Magnesium 250 MG TABS Take 250 mg by mouth in the morning.     Omega-3 Fatty Acids (OMEGA 3 PO) Take 1 capsule by mouth daily.     Potassium 99 MG TABS Take 99 mg by mouth in the morning.     Semaglutide,0.25 or 0.5MG /DOS, (OZEMPIC, 0.25 OR 0.5 MG/DOSE,) 2 MG/3ML SOPN Inject 0.5 mg into the skin once a week. 3 mL 11   No facility-administered medications prior to visit.     Per HPI unless specifically indicated in ROS section below Review of Systems  Constitutional:  Negative for fatigue and fever.  HENT:  Negative for congestion.   Eyes:  Negative for pain.  Respiratory:  Negative for cough and shortness of breath.   Cardiovascular:  Negative for chest pain, palpitations and leg swelling.  Gastrointestinal:  Negative for abdominal pain.  Genitourinary:  Negative for dysuria and vaginal bleeding.  Musculoskeletal:  Negative for back pain.  Neurological:  Negative for syncope, light-headedness and headaches.  Psychiatric/Behavioral:  Negative for dysphoric mood.    Objective:  BP 132/74   Pulse 75   Temp 98.7 F (37.1 C) (Oral)   Ht 4' 11.25" (1.505 m)   Wt 169 lb 12.8 oz (77 kg)   LMP 12/26/2011   SpO2 95%   BMI 34.01 kg/m   Wt Readings from Last 3 Encounters:  06/02/23 169 lb 12.8 oz (77 kg)  05/25/23 171 lb 6.4 oz (77.7 kg)  11/24/22 174 lb (78.9 kg)      Physical Exam Vitals and nursing note reviewed.  Constitutional:      General: She is not in acute distress.    Appearance: Normal appearance. She is well-developed. She is not ill-appearing or toxic-appearing.  HENT:     Head: Normocephalic.     Right Ear: Hearing, tympanic membrane, ear canal and external ear normal.     Left Ear: Hearing, tympanic membrane, ear canal and external ear normal.     Nose: Nose normal.  Eyes:     General: Lids are normal. Lids are everted, no foreign bodies  appreciated.     Conjunctiva/sclera: Conjunctivae normal.     Pupils: Pupils are equal, round, and reactive to light.  Neck:     Thyroid: No thyroid mass or thyromegaly.     Vascular: No carotid bruit.     Trachea: Trachea normal.  Cardiovascular:     Rate and Rhythm: Normal rate and regular rhythm.  Heart sounds: Normal heart sounds, S1 normal and S2 normal. No murmur heard.    No gallop.  Pulmonary:     Effort: Pulmonary effort is normal. No respiratory distress.     Breath sounds: Normal breath sounds. No wheezing, rhonchi or rales.  Abdominal:     General: Bowel sounds are normal. There is no distension or abdominal bruit.     Palpations: Abdomen is soft. There is no fluid wave or mass.     Tenderness: There is no abdominal tenderness. There is no guarding or rebound.     Hernia: No hernia is present.  Musculoskeletal:     Cervical back: Normal range of motion and neck supple.  Lymphadenopathy:     Cervical: No cervical adenopathy.  Skin:    General: Skin is warm and dry.     Findings: No rash.  Neurological:     Mental Status: She is alert.     Cranial Nerves: No cranial nerve deficit.     Sensory: No sensory deficit.  Psychiatric:        Mood and Affect: Mood is not anxious or depressed.        Speech: Speech normal.        Behavior: Behavior normal. Behavior is cooperative.        Judgment: Judgment normal.    Diabetic foot exam: Normal inspection No skin breakdown No calluses  Normal DP pulses Normal sensation to light touch and monofilament Nails normal     Results for orders placed or performed in visit on 05/26/23  Microalbumin / creatinine urine ratio   Collection Time: 05/26/23  8:07 AM  Result Value Ref Range   Microalb, Ur 1.2 0.0 - 1.9 mg/dL   Creatinine,U 010.2 mg/dL   Microalb Creat Ratio 6.0 0.0 - 30.0 mg/g  Lipid panel   Collection Time: 05/26/23  8:07 AM  Result Value Ref Range   Cholesterol 229 (H) 0 - 200 mg/dL   Triglycerides 725.3  0.0 - 149.0 mg/dL   HDL 66.44 >03.47 mg/dL   VLDL 42.5 0.0 - 95.6 mg/dL   LDL Cholesterol 387 (H) 0 - 99 mg/dL   Total CHOL/HDL Ratio 4    NonHDL 175.24   Hemoglobin A1c   Collection Time: 05/26/23  8:07 AM  Result Value Ref Range   Hgb A1c MFr Bld 6.2 4.6 - 6.5 %  Comprehensive metabolic panel   Collection Time: 05/26/23  8:07 AM  Result Value Ref Range   Sodium 140 135 - 145 mEq/L   Potassium 3.6 3.5 - 5.1 mEq/L   Chloride 101 96 - 112 mEq/L   CO2 29 19 - 32 mEq/L   Glucose, Bld 97 70 - 99 mg/dL   BUN 14 6 - 23 mg/dL   Creatinine, Ser 5.64 (H) 0.40 - 1.20 mg/dL   Total Bilirubin 0.6 0.2 - 1.2 mg/dL   Alkaline Phosphatase 86 39 - 117 U/L   AST 21 0 - 37 U/L   ALT 27 0 - 35 U/L   Total Protein 7.0 6.0 - 8.3 g/dL   Albumin 4.2 3.5 - 5.2 g/dL   GFR 33.29 (L) >51.88 mL/min   Calcium 10.0 8.4 - 10.5 mg/dL     COVID 19 screen:  No recent travel or known exposure to COVID19 The patient denies respiratory symptoms of COVID 19 at this time. The importance of social distancing was discussed today.   Assessment and Plan  Problem List Items Addressed This Visit  Acute renal insufficiency   New, possibly secondary to increase in BP med.  Decrease losartan hydrochlorothiazide back to 1 tablet daily of 50/12.5 mg daily.  Increase water intake.  Return in 2 weeks for recheck of kidney  function.      Relevant Orders   Basic Metabolic Panel   Diabetes mellitus without complication (HCC) (Chronic)    Chronic, excellent control with ozempic.  She has noted some slowed weight loss in last few months   Increase ozempic to 1 mg weekly.   Re-eval in 6 months.      Relevant Medications   Semaglutide, 1 MG/DOSE, 4 MG/3ML SOPN   Hyperlipidemia associated with type 2 diabetes mellitus (HCC) (Chronic)   Chronic,  worsened control possibly secondary to adding back chicken wings. Does use a lot of butter She will wor on decreasing cholesterol in diet.   Atorvastatin 20 mg daily       Relevant Medications   Semaglutide, 1 MG/DOSE, 4 MG/3ML SOPN   Hypertension associated with diabetes (HCC) - Primary (Chronic)    Given change in  creatinine .Marland Kitchen Will decrease losartan hydrochlorothiazide back to previous dose.   Follow BP.Marland Kitchen if consistently > 140/90.Marland Kitchen call for additional medication for BP.      Relevant Medications   Semaglutide, 1 MG/DOSE, 4 MG/3ML SOPN   Obesity, Class II, BMI 35-39.9, no comorbidity (HCC) (Chronic)   Encouraged exercise, weight loss, healthy eating habits.       Relevant Medications   Semaglutide, 1 MG/DOSE, 4 MG/3ML SOPN       Kerby Nora, MD

## 2023-06-02 NOTE — Assessment & Plan Note (Signed)
 Encouraged exercise, weight loss, healthy eating habits. ? ?

## 2023-06-02 NOTE — Addendum Note (Signed)
 Addended by: Alvina Chou on: 06/02/2023 11:24 AM   Modules accepted: Orders

## 2023-06-02 NOTE — Assessment & Plan Note (Addendum)
 Chronic,  worsened control possibly secondary to adding back chicken wings. Does use a lot of butter She will wor on decreasing cholesterol in diet.   Atorvastatin 20 mg daily

## 2023-06-04 ENCOUNTER — Encounter: Payer: Self-pay | Admitting: Family Medicine

## 2023-06-04 ENCOUNTER — Other Ambulatory Visit (INDEPENDENT_AMBULATORY_CARE_PROVIDER_SITE_OTHER)

## 2023-06-04 DIAGNOSIS — N289 Disorder of kidney and ureter, unspecified: Secondary | ICD-10-CM | POA: Diagnosis not present

## 2023-06-04 LAB — BASIC METABOLIC PANEL
BUN: 16 mg/dL (ref 6–23)
CO2: 28 meq/L (ref 19–32)
Calcium: 10.2 mg/dL (ref 8.4–10.5)
Chloride: 102 meq/L (ref 96–112)
Creatinine, Ser: 1.1 mg/dL (ref 0.40–1.20)
GFR: 52.23 mL/min — ABNORMAL LOW (ref >60.00)
Glucose, Bld: 90 mg/dL (ref 70–99)
Potassium: 3.4 meq/L — ABNORMAL LOW (ref 3.5–5.1)
Sodium: 140 meq/L (ref 135–145)

## 2023-07-03 ENCOUNTER — Other Ambulatory Visit: Payer: Self-pay | Admitting: Family Medicine

## 2023-10-11 ENCOUNTER — Ambulatory Visit (INDEPENDENT_AMBULATORY_CARE_PROVIDER_SITE_OTHER): Payer: Medicare Other

## 2023-10-11 VITALS — BP 132/77 | Ht 59.5 in | Wt 170.0 lb

## 2023-10-11 DIAGNOSIS — Z0000 Encounter for general adult medical examination without abnormal findings: Secondary | ICD-10-CM

## 2023-10-11 NOTE — Patient Instructions (Signed)
 Ms. Natalie Rice , Thank you for taking time out of your busy schedule to complete your Annual Wellness Visit with me. I enjoyed our conversation and look forward to speaking with you again next year. I, as well as your care team,  appreciate your ongoing commitment to your health goals. Please review the following plan we discussed and let me know if I can assist you in the future. Your Game plan/ To Do List    Referrals: If you haven't heard from the office you've been referred to, please reach out to them at the phone provided.   none Follow up Visits: Next Medicare AWV with our clinical staff: 10/11/2024   Have you seen your provider in the last 6 months (3 months if uncontrolled diabetes)? No Next Office Visit with your provider: n/a  Clinician Recommendations:  Aim for 30 minutes of exercise or brisk walking, 6-8 glasses of water, and 5 servings of fruits and vegetables each day.       This is a list of the screening recommended for you and due dates:  Health Maintenance  Topic Date Due   DTaP/Tdap/Td vaccine (2 - Td or Tdap) 06/23/2023   COVID-19 Vaccine (8 - Pfizer risk 2024-25 season) 06/26/2023   Eye exam for diabetics  09/18/2023   Complete foot exam   10/06/2023   Flu Shot  10/15/2023   Hemoglobin A1C  11/26/2023   Yearly kidney health urinalysis for diabetes  05/25/2024   Yearly kidney function blood test for diabetes  06/03/2024   Medicare Annual Wellness Visit  10/10/2024   Mammogram  10/14/2024   Colon Cancer Screening  12/18/2028   Pneumococcal Vaccine for age over 34  Completed   DEXA scan (bone density measurement)  Completed   Hepatitis C Screening  Completed   Zoster (Shingles) Vaccine  Completed   Hepatitis B Vaccine  Aged Out   HPV Vaccine  Aged Out   Meningitis B Vaccine  Aged Out    Advanced directives: (Declined) Advance directive discussed with you today. Even though you declined this today, please call our office should you change your mind, and we can give  you the proper paperwork for you to fill out. Advance Care Planning is important because it:  [x]  Makes sure you receive the medical care that is consistent with your values, goals, and preferences  [x]  It provides guidance to your family and loved ones and reduces their decisional burden about whether or not they are making the right decisions based on your wishes.  Follow the link provided in your after visit summary or read over the paperwork we have mailed to you to help you started getting your Advance Directives in place. If you need assistance in completing these, please reach out to us  so that we can help you!  See attachments for Preventive Care and Fall Prevention Tips.

## 2023-10-11 NOTE — Progress Notes (Unsigned)
 Because this visit was a virtual/telehealth visit,  certain criteria was not obtained, such a blood pressure, CBG if applicable, and timed get up and go. Any medications not marked as taking were not mentioned during the medication reconciliation part of the visit. Any vitals not documented were not able to be obtained due to this being a telehealth visit or patient was unable to self-report a recent blood pressure reading due to a lack of equipment at home via telehealth. Vitals that have been documented are verbally provided by the patient.  This visit was performed by a medical professional under my direct supervision. I was immediately available for consultation/collaboration. I have reviewed and agree with the Annual Wellness Visit documentation.  Subjective:   Natalie Rice is a 68 y.o. who presents for a Medicare Wellness preventive visit.  As a reminder, Annual Wellness Visits don't include a physical exam, and some assessments may be limited, especially if this visit is performed virtually. We may recommend an in-person follow-up visit with your provider if needed.  Visit Complete: Virtual I connected with  Natalie Rice on 10/11/23 by a audio enabled telemedicine application and verified that I am speaking with the correct person using two identifiers.  Patient Location: Home  Provider Location: Home Office  I discussed the limitations of evaluation and management by telemedicine. The patient expressed understanding and agreed to proceed.  Vital Signs: Because this visit was a virtual/telehealth visit, some criteria may be missing or patient reported. Any vitals not documented were not able to be obtained and vitals that have been documented are patient reported.  VideoDeclined- This patient declined Librarian, academic. Therefore the visit was completed with audio only.  Persons Participating in Visit: Patient.  AWV Questionnaire: No: Patient  Medicare AWV questionnaire was not completed prior to this visit.  Cardiac Risk Factors include: advanced age (>39men, >38 women);dyslipidemia;hypertension;obesity (BMI >30kg/m2)     Objective:    Today's Vitals   10/11/23 1138  BP: 132/77  Weight: 170 lb (77.1 kg)  Height: 4' 11.5 (1.511 m)   Body mass index is 33.76 kg/m.     10/11/2023   11:38 AM 10/06/2022   10:27 AM 06/30/2021    4:00 PM 04/23/2021    9:47 AM 03/27/2021    7:27 AM  Advanced Directives  Does Patient Have a Medical Advance Directive? No No No No No  Would patient like information on creating a medical advance directive? No - Patient declined No - Patient declined No - Patient declined No - Patient declined     Current Medications (verified) Outpatient Encounter Medications as of 10/11/2023  Medication Sig   anastrozole  (ARIMIDEX ) 1 MG tablet TAKE 1 TABLET BY MOUTH DAILY   Apoaequorin (PREVAGEN PO) Take 1 tablet by mouth in the morning.   atorvastatin  (LIPITOR) 20 MG tablet TAKE 1 TABLET BY MOUTH DAILY   Cholecalciferol (VITAMIN D-3) 125 MCG (5000 UT) TABS Take 5,000 Units by mouth in the morning.   clotrimazole -betamethasone  (LOTRISONE ) cream Apply 1 Application topically daily.   Coenzyme Q10 (COQ10) 200 MG CAPS Take 200 mg by mouth in the morning.   Insulin Pen Needle (NOVOFINE PLUS PEN NEEDLE) 32G X 4 MM MISC Use to inject Ozempic  once a week   Loratadine 10 MG CAPS Take 10 mg by mouth daily as needed (seasonal allergies.).   losartan -hydrochlorothiazide  (HYZAAR) 50-12.5 MG tablet TAKE 1 TABLET BY MOUTH DAILY   Magnesium 250 MG TABS Take 250 mg by mouth in  the morning.   Omega-3 Fatty Acids (OMEGA 3 PO) Take 1 capsule by mouth daily.   Potassium 99 MG TABS Take 99 mg by mouth in the morning.   Semaglutide , 1 MG/DOSE, 4 MG/3ML SOPN Inject 1 mg as directed once a week.   No facility-administered encounter medications on file as of 10/11/2023.    Allergies (verified) Patient has no known allergies.    History: Past Medical History:  Diagnosis Date   Allergy    seasonal   Breast cancer (HCC) 03/27/2021   right side lumpectemy   Colon polyp    Diabetes mellitus without complication (HCC)    Eosinophilia 03/03/2012   Hay fever    High blood pressure    High cholesterol    History of radiation therapy    right breast 05/01/2021-05/29/2021  Dr Lynwood Nasuti   Hyperlipidemia    Sleep apnea    uses mouthguard   Past Surgical History:  Procedure Laterality Date   BREAST LUMPECTOMY WITH RADIOACTIVE SEED LOCALIZATION Right 03/27/2021   Procedure: RIGHT BREAST LUMPECTOMY WITH RADIOACTIVE SEED LOCALIZATION;  Surgeon: Vernetta Berg, MD;  Location: [facility] OR;  Service: General;  Laterality: Right;   COLONOSCOPY  2012   no polyps   TUBAL LIGATION     Family History  Problem Relation Age of Onset   Cancer Mother 29       cervical cancer   Heart disease Father 44       MI   Heart disease Brother 2       MI   Diabetes Maternal Grandmother    Diabetes Maternal Grandfather    Colon cancer Neg Hx    Colon polyps Neg Hx    Esophageal cancer Neg Hx    Rectal cancer Neg Hx    Stomach cancer Neg Hx    Social History   Socioeconomic History   Marital status: Married    Spouse name: Not on file   Number of children: 0   Years of education: Not on file   Highest education level: Not on file  Occupational History    Employer: [employer]    Comment: [employer]  Tobacco Use   Smoking status: Former    Current packs/day: 0.00    Average packs/day: 1 pack/day for 3.0 years (3.0 ttl pk-yrs)    Types: Cigarettes    Start date: 03/16/2002    Quit date: 03/16/2005    Years since quitting: 18.5   Smokeless tobacco: Never   Tobacco comments:    quit 2007  Vaping Use   Vaping status: Never Used  Substance and Sexual Activity   Alcohol use: Yes    Comment: occassionally, every few months   Drug use: Never   Sexual activity: Not Currently    Birth control/protection:  Post-menopausal, Surgical  Other Topics Concern   Not on file  Social History Narrative   Not on file   Social Drivers of Health   Financial Resource Strain: Low Risk  (10/11/2023)   Overall Financial Resource Strain (CARDIA)    Difficulty of Paying Living Expenses: Not hard at all  Food Insecurity: No Food Insecurity (10/11/2023)   Hunger Vital Sign    Worried About Running Out of Food in the Last Year: Never true    Ran Out of Food in the Last Year: Never true  Transportation Needs: No Transportation Needs (10/11/2023)   PRAPARE - Administrator, Civil Service (Medical): No    Lack of Transportation (  Non-Medical): No  Physical Activity: Sufficiently Active (10/11/2023)   Exercise Vital Sign    Days of Exercise per Week: 3 days    Minutes of Exercise per Session: 90 min  Stress: No Stress Concern Present (10/11/2023)   [employer] of Occupational Health - Occupational Stress Questionnaire    Feeling of Stress: Not at all  Social Connections: Socially Integrated (10/11/2023)   Social Connection and Isolation Panel    Frequency of Communication with Friends and Family: More than three times a week    Frequency of Social Gatherings with Friends and Family: More than three times a week    Attends Religious Services: More than 4 times per year    Active Member of [employer] or Organizations: Yes    Attends Engineer, structural: More than 4 times per year    Marital Status: Married    Tobacco Counseling Counseling given: Not Answered Tobacco comments: quit 2007    Clinical Intake:  Pre-visit preparation completed: Yes  Pain : No/denies pain     BMI - recorded: 33.76 Nutritional Status: BMI > 30  Obese Nutritional Risks: None Diabetes: Yes CBG done?: No Did pt. bring in CBG monitor from home?: No  Lab Results  Component Value Date   HGBA1C 6.2 05/26/2023   HGBA1C 6.3 09/29/2022   HGBA1C 6.7 (H) 04/02/2022     How often do you need to have  someone help you when you read instructions, pamphlets, or other written materials from your doctor or pharmacy?: 1 - Never What is the last grade level you completed in school?: some college  Interpreter Needed?: No  Information entered by :: Jachob Mcclean [email or web address],CMA   Activities of Daily Living     10/11/2023   11:42 AM  In your present state of health, do you have any difficulty performing the following activities:  Hearing? 0  Vision? 0  Difficulty concentrating or making decisions? 0  Walking or climbing stairs? 0  Dressing or bathing? 0  Doing errands, shopping? 0  Preparing Food and eating ? N  Using the Toilet? N  In the past six months, have you accidently leaked urine? N  Do you have problems with loss of bowel control? N  Managing your Medications? N  Managing your Finances? N  Housekeeping or managing your Housekeeping? N    Patient Care Team: Avelina Greig BRAVO, MD as PCP - General (Family Medicine) Tyree Nanetta SAILOR, RN as Oncology Nurse Navigator Glean, Stephane BROCKS, RN (Inactive) as Oncology Nurse Navigator Lanny Callander, MD as Consulting Physician (Medical Oncology) Shannon Agent, MD as Consulting Physician (Radiation Oncology) Vernetta Berg, MD as Consulting Physician (General Surgery) Burton, Lacie K, NP as Nurse Practitioner (Nurse Practitioner)  I have updated your Care Teams any recent Medical Services you may have received from other providers in the past year.     Assessment:   This is a routine wellness examination for Natalie Rice.  Hearing/Vision screen Hearing Screening - Comments:: No difficulties  Vision Screening - Comments:: Patient wears eye glasses    Goals Addressed             This Visit's Progress    Patient Stated       For her house to be built by next year       Depression Screen     10/11/2023   11:43 AM 06/02/2023    8:29 AM 10/06/2022   10:26 AM 04/10/2022   10:54 AM 09/23/2021    9:09 AM 05/29/2020  9:25 AM 05/02/2019   11:26 AM   PHQ 2/9 Scores  PHQ - 2 Score 0 0 0 0 0 0 0  PHQ- 9 Score 0 1         Fall Risk     10/11/2023   11:41 AM 06/02/2023    8:29 AM 10/06/2022   10:28 AM 04/10/2022   10:54 AM 09/23/2021    9:09 AM  Fall Risk   Falls in the past year? 0 0 1 0 0  Number falls in past yr: 0 0 0 0   Comment   fell on uneven ground    Injury with Fall? 0 0 0 0   Risk for fall due to : No Fall Risks No Fall Risks No Fall Risks No Fall Risks   Follow up Falls evaluation completed Falls evaluation completed Falls evaluation completed;Falls prevention discussed Falls evaluation completed     MEDICARE RISK AT HOME:  Medicare Risk at Home Any stairs in or around the home?: Yes If so, are there any without handrails?: No Home free of loose throw rugs in walkways, pet beds, electrical cords, etc?: Yes Adequate lighting in your home to reduce risk of falls?: Yes Life alert?: No Use of a cane, walker or w/c?: No Grab bars in the bathroom?: Yes Shower chair or bench in shower?: Yes Elevated toilet seat or a handicapped toilet?: Yes  TIMED UP AND GO:  Was the test performed?  No  Cognitive Function: 6CIT completed        10/11/2023   11:44 AM 10/06/2022   10:31 AM  6CIT Screen  What Year? 0 points 0 points  What month? 0 points 0 points  What time? 0 points 0 points  Count back from 20 0 points 0 points  Months in reverse 0 points 0 points  Repeat phrase 0 points 0 points  Total Score 0 points 0 points    Immunizations Immunization History  Administered Date(s) Administered   Fluad Quad(high Dose 65+) 12/27/2021   Hep A, Unspecified 02/01/2023   Hep B, Unspecified 02/01/2023   Influenza Split 11/29/2011   Influenza, High Dose Seasonal PF 12/28/2021, 12/26/2022   Influenza,inj,Quad PF,6+ Mos 12/19/2012, 12/15/2013, 01/29/2015, 12/31/2015, 01/13/2017, 12/30/2017, 11/24/2018, 12/23/2019   PFIZER Comirnaty(Gray Top)Covid-19 Tri-Sucrose Vaccine 09/04/2020   PFIZER(Purple Top)SARS-COV-2 Vaccination  05/18/2019, 06/09/2019, 01/01/2020, 09/04/2020   PNEUMOCOCCAL CONJUGATE-20 09/23/2021   Pfizer Covid Bivalent Pediatric Vaccine(11mos to <17yrs) 02/27/2021   Pfizer Covid-19 Vaccine Bivalent Booster 30yrs & up 02/27/2021   Pfizer(Comirnaty)Fall Seasonal Vaccine 12 years and older 12/27/2021, 12/26/2022   Pneumococcal Polysaccharide-23 05/29/2020   Tdap 06/22/2013   Typhoid Inactivated 02/01/2023   Yellow Fever 02/01/2023   Zoster Recombinant(Shingrix ) 11/24/2018, 02/23/2019    Screening Tests Health Maintenance  Topic Date Due   DTaP/Tdap/Td (2 - Td or Tdap) 06/23/2023   COVID-19 Vaccine (8 - Pfizer risk 2024-25 season) 06/26/2023   OPHTHALMOLOGY EXAM  09/18/2023   FOOT EXAM  10/06/2023   INFLUENZA VACCINE  10/15/2023   HEMOGLOBIN A1C  11/26/2023   Diabetic kidney evaluation - Urine ACR  05/25/2024   Diabetic kidney evaluation - eGFR measurement  06/03/2024   Medicare Annual Wellness (AWV)  10/10/2024   MAMMOGRAM  10/14/2024   Colonoscopy  12/18/2028   Pneumococcal Vaccine: 50+ Years  Completed   DEXA SCAN  Completed   Hepatitis C Screening  Completed   Zoster Vaccines- Shingrix   Completed   Hepatitis B Vaccines  Aged Out   HPV VACCINES  Aged Out  Meningococcal B Vaccine  Aged Out    Health Maintenance  Health Maintenance Due  Topic Date Due   DTaP/Tdap/Td (2 - Td or Tdap) 06/23/2023   COVID-19 Vaccine (8 - Pfizer risk 2024-25 season) 06/26/2023   OPHTHALMOLOGY EXAM  09/18/2023   FOOT EXAM  10/06/2023   Health Maintenance Items Addressed:patient declined health maintenance   Additional Screening:  Vision Screening: Recommended annual ophthalmology exams for early detection of glaucoma and other disorders of the eye. Would you like a referral to an eye doctor? No    Dental Screening: Recommended annual dental exams for proper oral hygiene  Community Resource Referral / Chronic Care Management: CRR required this visit?  No   CCM required this visit?  No   Plan:     I have personally reviewed and noted the following in the patient's chart:   Medical and social history Use of alcohol, tobacco or illicit drugs  Current medications and supplements including opioid prescriptions. Patient is not currently taking opioid prescriptions. Functional ability and status Nutritional status Physical activity Advanced directives List of other physicians Hospitalizations, surgeries, and ER visits in previous 12 months Vitals Screenings to include cognitive, depression, and falls Referrals and appointments  In addition, I have reviewed and discussed with patient certain preventive protocols, quality metrics, and best practice recommendations. A written personalized care plan for preventive services as well as general preventive health recommendations were provided to patient.   Natalie Rice Right, [place]   10/11/2023   After Visit Summary: (MyChart) Due to this being a telephonic visit, the after visit summary with patients personalized plan was offered to patient via MyChart   Notes: Nothing significant to report at this time.

## 2023-11-23 ENCOUNTER — Other Ambulatory Visit: Payer: Self-pay | Admitting: Nurse Practitioner

## 2023-11-23 DIAGNOSIS — D0511 Intraductal carcinoma in situ of right breast: Secondary | ICD-10-CM

## 2023-11-23 NOTE — Progress Notes (Unsigned)
 Patient Care Team: Avelina Greig BRAVO, MD as PCP - General (Family Medicine) Tyree Nanetta SAILOR, RN as Oncology Nurse Navigator Glean, Stephane BROCKS, RN (Inactive) as Oncology Nurse Navigator Lanny Callander, MD as Consulting Physician (Medical Oncology) Shannon Agent, MD as Consulting Physician (Radiation Oncology) Vernetta Berg, MD as Consulting Physician (General Surgery) Burton, Lacie K, NP as Nurse Practitioner (Nurse Practitioner)  Clinic Day:  11/23/2023  Referring physician: Avelina Greig BRAVO, MD  ASSESSMENT & PLAN:   Assessment & Plan: No problem-specific Assessment & Plan notes found for this encounter.    The patient understands the plans discussed today and is in agreement with them.  She knows to contact our office if she develops concerns prior to her next appointment.  I provided *** minutes of face-to-face time during this encounter and > 50% was spent counseling as documented under my assessment and plan.    Natalie BRAVO Lessen, NP  [facility] - A DEPT OF [facility] [address] [place] [address] Dept: [phone] Dept Fax: [phone]   No orders of the defined types were placed in this encounter.     CHIEF COMPLAINT:  CC: DCIS of right breast  Current Treatment: Anastrozole  starting 11/2021  INTERVAL HISTORY:  Natalie Rice is here today for repeat clinical assessment.  She last saw Lacie, NP on 05/25/2023.  She goes to [facility] mammography for annual mammogram. She denies fevers or chills. She denies pain. Her appetite is good. Her weight {Weight change:10426}.  I have reviewed the past medical history, past surgical history, social history and family history with the patient and they are unchanged from previous note.  ALLERGIES:  has no known allergies.  MEDICATIONS:  Current Outpatient Medications  Medication Sig Dispense Refill   anastrozole  (ARIMIDEX ) 1 MG tablet TAKE 1 TABLET BY MOUTH DAILY 100  tablet 2   Apoaequorin (PREVAGEN PO) Take 1 tablet by mouth in the morning.     atorvastatin  (LIPITOR) 20 MG tablet TAKE 1 TABLET BY MOUTH DAILY 100 tablet 2   Cholecalciferol (VITAMIN D-3) 125 MCG (5000 UT) TABS Take 5,000 Units by mouth in the morning.     clotrimazole -betamethasone  (LOTRISONE ) cream Apply 1 Application topically daily. 30 g 0   Coenzyme Q10 (COQ10) 200 MG CAPS Take 200 mg by mouth in the morning.     Insulin Pen Needle (NOVOFINE PLUS PEN NEEDLE) 32G X 4 MM MISC Use to inject Ozempic  once a week 30 each 0   Loratadine 10 MG CAPS Take 10 mg by mouth daily as needed (seasonal allergies.).     losartan -hydrochlorothiazide  (HYZAAR) 50-12.5 MG tablet TAKE 1 TABLET BY MOUTH DAILY 100 tablet 2   Magnesium 250 MG TABS Take 250 mg by mouth in the morning.     Omega-3 Fatty Acids (OMEGA 3 PO) Take 1 capsule by mouth daily.     Potassium 99 MG TABS Take 99 mg by mouth in the morning.     Semaglutide , 1 MG/DOSE, 4 MG/3ML SOPN Inject 1 mg as directed once a week. 3 mL 11   No current facility-administered medications for this visit.    HISTORY OF PRESENT ILLNESS:   Oncology History Overview Note   Cancer Staging  Ductal carcinoma in situ (DCIS) of right breast Staging form: Breast, AJCC 8th Edition - Clinical stage from 03/12/2021: Stage 0 (cTis (DCIS), cN0, cM0, ER+, PR+) - Signed by Lanny Callander, MD on 03/12/2021 Stage prefix: Initial diagnosis Nuclear  grade: G3 - Pathologic stage from 03/27/2021: Stage Unknown (pTis (DCIS), pNX, cM0, G3, ER+, PR+, HER2: Not Assessed) - Signed by Lanny Callander, MD on 05/23/2021 Stage prefix: Initial diagnosis Histologic grading system: 3 grade system Residual tumor (R): R0 - None     Ductal carcinoma in situ (DCIS) of right breast  02/12/2021 Mammogram   Exam: 3D Mammogram Diagnotic - Right  IMPRESSION: The 1.2 cm grouped fine linear calcifications in the right breast are suspicious.   02/26/2021 Initial Biopsy   Diagnosis Breast, right,  needle core biopsy, 9 o'clock, 5.5 cmfn - DUCTAL CARCINOMA IN SITU WITH NECROSIS AND CALCIFICATIONS - SEE COMMENT Microscopic Comment Based on the biopsy, the ductal carcinoma in situ has a comedo pattern, high nuclear grade and measures 0.1 cm in greatest linear extent.  PROGNOSTIC INDICATORS Results: Estrogen Receptor: 90%, POSITIVE, MODERATE STAINING INTENSITY Progesterone Receptor: 40%, POSITIVE, STRONG-MODERATE STAINING INTENSITY   03/05/2021 Initial Diagnosis   Ductal carcinoma in situ (DCIS) of right breast   03/12/2021 Cancer Staging   Staging form: Breast, AJCC 8th Edition - Clinical stage from 03/12/2021: Stage 0 (cTis (DCIS), cN0, cM0, ER+, PR+) - Signed by Lanny Callander, MD on 03/12/2021 Stage prefix: Initial diagnosis Nuclear grade: G3   03/27/2021 Cancer Staging   Staging form: Breast, AJCC 8th Edition - Pathologic stage from 03/27/2021: Stage Unknown (pTis (DCIS), pNX, cM0, G3, ER+, PR+, HER2: Not Assessed) - Signed by Lanny Callander, MD on 05/23/2021 Stage prefix: Initial diagnosis Histologic grading system: 3 grade system Residual tumor (R): R0 - None    Definitive Surgery   FINAL MICROSCOPIC DIAGNOSIS:   A. BREAST, RIGHT, LUMPECTOMY:  -  Ductal carcinoma in situ (DCIS), grade III (high), with central  (comedo) necrosis and calcifications, 8 mm in greatest dimension.  -  Resected edges negative for DCIS, distance from DCIS to closest edge 7 mm, anterior.  -  Negative for invasive carcinoma.  -  Seed implants with foreign body reaction and inflammation.   B. BREAST, RIGHT NEW ANTERIOR MARGIN, EXCISION:  -  Benign breast tissue with mild cystic apocrine metaplasia.  -  Negative for DCIS and invasive carcinoma.    05/01/2021 - 05/29/2021 Radiation Therapy    Site Technique Total Dose (Gy) Dose per Fx (Gy) Completed Fx Beam Energies  Breast, Right: Breast_R 3D 40.05/40.05 2.67 15/15 6XFFF, 10XFFF  Breast, Right: Breast_R_Bst 3D 10/10 2 5/5 6X, 10X     08/20/2021  Survivorship   SCP delivered by Lacie Burton, NP       REVIEW OF SYSTEMS:   Constitutional: Denies fevers, chills or abnormal weight loss Eyes: Denies blurriness of vision Ears, nose, mouth, throat, and face: Denies mucositis or sore throat Respiratory: Denies cough, dyspnea or wheezes Cardiovascular: Denies palpitation, chest discomfort or lower extremity swelling Gastrointestinal:  Denies nausea, heartburn or change in bowel habits Skin: Denies abnormal skin rashes Lymphatics: Denies new lymphadenopathy or easy bruising Neurological:Denies numbness, tingling or new weaknesses Behavioral/Psych: Mood is stable, no new changes  All other systems were reviewed with the patient and are negative.   VITALS:  Last menstrual period 12/26/2011.  Wt Readings from Last 3 Encounters:  10/11/23 170 lb (77.1 kg)  06/02/23 169 lb 12.8 oz (77 kg)  05/25/23 171 lb 6.4 oz (77.7 kg)    There is no height or weight on file to calculate BMI.  Performance status (ECOG): {CHL ONC [id number]  PHYSICAL EXAM:   GENERAL:alert, no distress and comfortable SKIN: skin color, texture, turgor are normal, no  rashes or significant lesions EYES: normal, Conjunctiva are pink and non-injected, sclera clear OROPHARYNX:no exudate, no erythema and lips, buccal mucosa, and tongue normal  NECK: supple, thyroid normal size, non-tender, without nodularity LYMPH:  no palpable lymphadenopathy in the cervical, axillary or inguinal LUNGS: clear to auscultation and percussion with normal breathing effort HEART: regular rate & rhythm and no murmurs and no lower extremity edema ABDOMEN:abdomen soft, non-tender and normal bowel sounds Musculoskeletal:no cyanosis of digits and no clubbing  NEURO: alert & oriented x 3 with fluent speech, no focal motor/sensory deficits  LABORATORY DATA:  I have reviewed the data as listed    Component Value Date/Time   NA 140 06/04/2023 0845   NA 138 09/10/2012 0704   K 3.4 (L)  06/04/2023 0845   K 3.7 09/10/2012 0704   CL 102 06/04/2023 0845   CL 104 09/10/2012 0704   CO2 28 06/04/2023 0845   CO2 28 09/10/2012 0704   GLUCOSE 90 06/04/2023 0845   GLUCOSE 89 09/10/2012 0704   BUN 16 06/04/2023 0845   BUN 20 (H) 09/10/2012 0704   CREATININE 1.10 06/04/2023 0845   CREATININE 1.36 (H) 05/25/2023 0846   CREATININE 0.98 09/10/2012 0704   CALCIUM  10.2 06/04/2023 0845   CALCIUM  9.7 09/10/2012 0704   PROT 7.0 05/26/2023 0807   PROT 8.3 (H) 03/23/2012 1536   ALBUMIN 4.2 05/26/2023 0807   ALBUMIN 4.2 03/23/2012 1536   AST 21 05/26/2023 0807   AST 22 05/25/2023 0846   ALT 27 05/26/2023 0807   ALT 27 05/25/2023 0846   ALT 36 03/23/2012 1536   ALKPHOS 86 05/26/2023 0807   ALKPHOS 91 03/23/2012 1536   BILITOT 0.6 05/26/2023 0807   BILITOT 0.6 05/25/2023 0846   GFRNONAA 43 (L) 05/25/2023 0846   GFRNONAA >60 09/10/2012 0704   GFRAA >60 09/10/2012 0704    No results found for: SPEP, UPEP  Lab Results  Component Value Date   WBC 7.6 05/25/2023   NEUTROABS 4.9 05/25/2023   HGB 14.3 05/25/2023   HCT 43.5 05/25/2023   MCV 86.1 05/25/2023   PLT 317 05/25/2023      Chemistry      Component Value Date/Time   NA 140 06/04/2023 0845   NA 138 09/10/2012 0704   K 3.4 (L) 06/04/2023 0845   K 3.7 09/10/2012 0704   CL 102 06/04/2023 0845   CL 104 09/10/2012 0704   CO2 28 06/04/2023 0845   CO2 28 09/10/2012 0704   BUN 16 06/04/2023 0845   BUN 20 (H) 09/10/2012 0704   CREATININE 1.10 06/04/2023 0845   CREATININE 1.36 (H) 05/25/2023 0846   CREATININE 0.98 09/10/2012 0704      Component Value Date/Time   CALCIUM  10.2 06/04/2023 0845   CALCIUM  9.7 09/10/2012 0704   ALKPHOS 86 05/26/2023 0807   ALKPHOS 91 03/23/2012 1536   AST 21 05/26/2023 0807   AST 22 05/25/2023 0846   ALT 27 05/26/2023 0807   ALT 27 05/25/2023 0846   ALT 36 03/23/2012 1536   BILITOT 0.6 05/26/2023 0807   BILITOT 0.6 05/25/2023 0846       RADIOGRAPHIC STUDIES: I have  personally reviewed the radiological images as listed and agreed with the findings in the report. No results found.

## 2023-11-23 NOTE — Assessment & Plan Note (Addendum)
 grade 3, ER+/PR+ -Diagnosed 01/23/21. S/p right lumpectomy on 03/27/21 and adjuvant radiation under Dr. Shannon, 05/01/21 - 05/29/21 -she started adjuvant anastrozole  in 01/2022, tolerating well with mild stiffness and hot flashes. SE's are tolerable -Mammogram 10/15/22 was negative -Natalie Rice is clinically doing well.  Exam is benign, labs are unremarkable.  Overall no clinical concern for recurrence. -Continue breast cancer surveillance and anastrozole .  Mammo 10/2023 -Follow-up in 6 months then annually, or sooner if needed

## 2023-11-24 ENCOUNTER — Encounter: Payer: Self-pay | Admitting: Nurse Practitioner

## 2023-11-24 ENCOUNTER — Inpatient Hospital Stay: Attending: Nurse Practitioner

## 2023-11-24 ENCOUNTER — Inpatient Hospital Stay (HOSPITAL_BASED_OUTPATIENT_CLINIC_OR_DEPARTMENT_OTHER): Admitting: Nurse Practitioner

## 2023-11-24 VITALS — BP 138/88 | HR 68 | Temp 97.6°F | Resp 17 | Wt 167.2 lb

## 2023-11-24 DIAGNOSIS — Z923 Personal history of irradiation: Secondary | ICD-10-CM | POA: Insufficient documentation

## 2023-11-24 DIAGNOSIS — D0511 Intraductal carcinoma in situ of right breast: Secondary | ICD-10-CM | POA: Diagnosis not present

## 2023-11-24 DIAGNOSIS — R61 Generalized hyperhidrosis: Secondary | ICD-10-CM | POA: Diagnosis not present

## 2023-11-24 DIAGNOSIS — R232 Flushing: Secondary | ICD-10-CM | POA: Diagnosis not present

## 2023-11-24 DIAGNOSIS — Z79811 Long term (current) use of aromatase inhibitors: Secondary | ICD-10-CM | POA: Insufficient documentation

## 2023-11-24 LAB — CBC WITH DIFFERENTIAL (CANCER CENTER ONLY)
Abs Immature Granulocytes: 0.02 K/uL (ref 0.00–0.07)
Basophils Absolute: 0.1 K/uL (ref 0.0–0.1)
Basophils Relative: 1 %
Eosinophils Absolute: 0.7 K/uL — ABNORMAL HIGH (ref 0.0–0.5)
Eosinophils Relative: 9 %
HCT: 44.5 % (ref 36.0–46.0)
Hemoglobin: 14.9 g/dL (ref 12.0–15.0)
Immature Granulocytes: 0 %
Lymphocytes Relative: 25 %
Lymphs Abs: 1.8 K/uL (ref 0.7–4.0)
MCH: 28.4 pg (ref 26.0–34.0)
MCHC: 33.5 g/dL (ref 30.0–36.0)
MCV: 84.9 fL (ref 80.0–100.0)
Monocytes Absolute: 0.6 K/uL (ref 0.1–1.0)
Monocytes Relative: 9 %
Neutro Abs: 4 K/uL (ref 1.7–7.7)
Neutrophils Relative %: 56 %
Platelet Count: 319 K/uL (ref 150–400)
RBC: 5.24 MIL/uL — ABNORMAL HIGH (ref 3.87–5.11)
RDW: 13.1 % (ref 11.5–15.5)
WBC Count: 7.1 K/uL (ref 4.0–10.5)
nRBC: 0 % (ref 0.0–0.2)

## 2023-11-24 LAB — CMP (CANCER CENTER ONLY)
ALT: 39 U/L (ref 0–44)
AST: 27 U/L (ref 15–41)
Albumin: 4.3 g/dL (ref 3.5–5.0)
Alkaline Phosphatase: 102 U/L (ref 38–126)
Anion gap: 8 (ref 5–15)
BUN: 10 mg/dL (ref 8–23)
CO2: 27 mmol/L (ref 22–32)
Calcium: 10.2 mg/dL (ref 8.9–10.3)
Chloride: 105 mmol/L (ref 98–111)
Creatinine: 0.95 mg/dL (ref 0.44–1.00)
GFR, Estimated: >60 mL/min (ref >60)
Glucose, Bld: 98 mg/dL (ref 70–99)
Potassium: 4 mmol/L (ref 3.5–5.1)
Sodium: 140 mmol/L (ref 135–145)
Total Bilirubin: 0.6 mg/dL (ref 0.0–1.2)
Total Protein: 7.4 g/dL (ref 6.5–8.1)

## 2023-11-30 DIAGNOSIS — R928 Other abnormal and inconclusive findings on diagnostic imaging of breast: Secondary | ICD-10-CM | POA: Diagnosis not present

## 2023-11-30 DIAGNOSIS — R92323 Mammographic fibroglandular density, bilateral breasts: Secondary | ICD-10-CM | POA: Diagnosis not present

## 2023-11-30 DIAGNOSIS — Z780 Asymptomatic menopausal state: Secondary | ICD-10-CM | POA: Diagnosis not present

## 2023-12-07 ENCOUNTER — Telehealth: Payer: Self-pay

## 2023-12-07 ENCOUNTER — Other Ambulatory Visit: Payer: Self-pay | Admitting: Family Medicine

## 2023-12-07 ENCOUNTER — Other Ambulatory Visit: Payer: Self-pay

## 2023-12-07 ENCOUNTER — Telehealth: Payer: Self-pay | Admitting: *Deleted

## 2023-12-07 DIAGNOSIS — E1159 Type 2 diabetes mellitus with other circulatory complications: Secondary | ICD-10-CM

## 2023-12-07 DIAGNOSIS — E119 Type 2 diabetes mellitus without complications: Secondary | ICD-10-CM

## 2023-12-07 MED ORDER — LOSARTAN POTASSIUM-HCTZ 50-12.5 MG PO TABS
1.0000 | ORAL_TABLET | Freq: Every day | ORAL | 2 refills | Status: DC
Start: 2023-12-07 — End: 2023-12-07

## 2023-12-07 MED ORDER — SEMAGLUTIDE (1 MG/DOSE) 4 MG/3ML ~~LOC~~ SOPN: 1.0000 mg | PEN_INJECTOR | SUBCUTANEOUS | 0 refills | Status: DC

## 2023-12-07 MED ORDER — LOSARTAN POTASSIUM-HCTZ 50-12.5 MG PO TABS: 1.0000 | ORAL_TABLET | Freq: Every day | ORAL | 0 refills | Status: DC

## 2023-12-07 MED ORDER — SEMAGLUTIDE (1 MG/DOSE) 4 MG/3ML ~~LOC~~ SOPN
1.0000 mg | PEN_INJECTOR | SUBCUTANEOUS | 0 refills | Status: DC
Start: 2023-12-07 — End: 2023-12-07

## 2023-12-07 MED ORDER — SEMAGLUTIDE (1 MG/DOSE) 4 MG/3ML ~~LOC~~ SOPN: 1.0000 mg | PEN_INJECTOR | SUBCUTANEOUS | 11 refills | Status: DC

## 2023-12-07 NOTE — Telephone Encounter (Signed)
 Copied from CRM #[id number]. Topic: Clinical - Prescription Issue >> Dec 07, 2023 12:14 PM [place] wrote: Reason for CRM: [place]- wanting to get a written prescription for Semaglutide  and Losartan , she can get it while [place] of the country.

## 2023-12-07 NOTE — Telephone Encounter (Signed)
 Patient wanted just a 90 day supply prescription on both medications.  Prescriptions reprinted.

## 2023-12-07 NOTE — Addendum Note (Signed)
 Addended by: WENDELL ARLAND RAMAN on: 12/07/2023 03:24 PM   Modules accepted: Orders

## 2023-12-07 NOTE — Telephone Encounter (Signed)
 Pended as print for patient

## 2023-12-07 NOTE — Telephone Encounter (Signed)
 Pt called requesting to speak with Natalie Rice regarding her recent lab results drawn on 11/24/2023 and her recent bone density results.  Stated that the bone density results which were done at [facility] has not been received from [facility] or scanned into the pt's chart as of today.  Stated this nurse will make Natalie Rice aware of the pt's call and request.  Pt verbalized understanding and had no further questions or concerns.

## 2023-12-07 NOTE — Telephone Encounter (Signed)
 Spoke with Natalie Rice and let her know that Medicare will not cover a routine tetatnus shot here at the office.  I advised she will need to go get this done at her pharmacy because they can file it under her prescription coverage.  Patient states understanding.

## 2023-12-07 NOTE — Telephone Encounter (Unsigned)
 Copied from CRM #[id number]. Topic: Clinical - Medication Refill >> Dec 07, 2023 12:11 PM [place] M wrote: Medication: losartan -hydrochlorothiazide  (HYZAAR) 50-12.5 MG tablet;    Has the patient contacted their pharmacy? Yes (Agent: If no, request that the patient contact the pharmacy for the refill. If patient does not wish to contact the pharmacy document the reason why and proceed with request.) (Agent: If yes, when and what did the pharmacy advise?)  This is the patient's preferred pharmacy:   [facility] - [place], [place] - [address] [place] [address] Phone: [phone] Fax: [phone]  Is this the correct pharmacy for this prescription? Yes If no, delete pharmacy and type the correct one.   Has the prescription been filled recently? Yes  Is the patient out of the medication? Yes  Has the patient been seen for an appointment in the last year OR does the patient have an upcoming appointment? Yes  Can we respond through MyChart? Yes  Agent: Please be advised that Rx refills may take up to 3 business days. We ask that you follow-up with your pharmacy.

## 2023-12-07 NOTE — Telephone Encounter (Signed)
 Pt came by and picked up her printed prescriptions.

## 2023-12-07 NOTE — Telephone Encounter (Signed)
 Patient also needs a refill on the Ozempic  sent electronically to [facility] to refill now.  Refill sent as requested.

## 2023-12-07 NOTE — Telephone Encounter (Signed)
 Copied from CRM #[id number]. Topic: Appointments - Scheduling Inquiry for Clinic >> Dec 07, 2023 12:14 PM [place] wrote: Reason for CRM: Patient wanting to get another Tetanus shot. Please reach out to patient

## 2023-12-07 NOTE — Telephone Encounter (Unsigned)
 Copied from CRM [id number]. Topic: Clinical - Medication Refill >> Dec 07, 2023  5:10 PM [place] G wrote: Medication: clotrimazole -betamethasone  (LOTRISONE ) cream  Has the patient contacted their pharmacy? Yes (Agent: If no, request that the patient contact the pharmacy for the refill. If patient does not wish to contact the pharmacy document the reason why and proceed with request.) (Agent: If yes, when and what did the pharmacy advise?)  This is the patient's preferred pharmacy:  [phone], [place] - [address] [place] [address] Phone: [phone] Fax: [phone]  [facility] - [place], [place] - [address] [place] [address] Phone: [phone] Fax: [phone]  Is this the correct pharmacy for this prescription? Yes If no, delete pharmacy and type the correct one.   Has the prescription been filled recently? No  Is the patient out of the medication? Yes  Has the patient been seen for an appointment in the last year OR does the patient have an upcoming appointment? Yes  Can we respond through MyChart? Yes  Agent: Please be advised that Rx refills may take up to 3 business days. We ask that you follow-up with your pharmacy.

## 2023-12-07 NOTE — Addendum Note (Signed)
 Addended by: WENDELL ARLAND RAMAN on: 12/07/2023 03:53 PM   Modules accepted: Orders

## 2023-12-07 NOTE — Telephone Encounter (Signed)
 Copied from CRM [id number]. Topic: Clinical - Medication Refill >> Dec 07, 2023 12:13 PM [place] M wrote: Medication: Semaglutide , 1 MG/DOSE, 4 MG/3ML SOPN  Has the patient contacted their pharmacy? Yes (Agent: If no, request that the patient contact the pharmacy for the refill. If patient does not wish to contact the pharmacy document the reason why and proceed with request.) (Agent: If yes, when and what did the pharmacy advise?)  This is the patient's preferred pharmacy:  [phone] [facility], [place] - [address] [place] [address] Phone: [phone] Fax: [phone]   Is this the correct pharmacy for this prescription? Yes If no, delete pharmacy and type the correct one.   Has the prescription been filled recently? Yes  Is the patient out of the medication? Yes  Has the patient been seen for an appointment in the last year OR does the patient have an upcoming appointment? Yes  Can we respond through MyChart? Yes  Agent: Please be advised that Rx refills may take up to 3 business days. We ask that you follow-up with your pharmacy.

## 2023-12-08 ENCOUNTER — Telehealth: Payer: Self-pay | Admitting: *Deleted

## 2023-12-08 ENCOUNTER — Other Ambulatory Visit: Payer: Self-pay | Admitting: Family Medicine

## 2023-12-08 DIAGNOSIS — E1169 Type 2 diabetes mellitus with other specified complication: Secondary | ICD-10-CM

## 2023-12-08 DIAGNOSIS — E119 Type 2 diabetes mellitus without complications: Secondary | ICD-10-CM

## 2023-12-08 MED ORDER — CLOTRIMAZOLE-BETAMETHASONE 1-0.05 % EX CREA: 1.0000 | TOPICAL_CREAM | Freq: Every day | CUTANEOUS | 0 refills | Status: AC

## 2023-12-08 NOTE — Telephone Encounter (Signed)
 Copied from CRM #[id number]. Topic: Clinical - Request for Lab/Test Order >> Dec 08, 2023  3:31 PM [place] B wrote: Reason for CRM: Pt requesting labs before physical.

## 2023-12-08 NOTE — Telephone Encounter (Signed)
 Future Lab Orders placed in Epic.  Lab appointment already scheduled.

## 2023-12-08 NOTE — Telephone Encounter (Signed)
 Last office visit 06/02/2023 for 6 month follow up.  Last refilled 01/29/2022 for 30 g with no refills.  Next Appt: No future appointments with PCP.   Please call and schedule CPE with fasting labs prior with Dr. Avelina.

## 2023-12-08 NOTE — Telephone Encounter (Signed)
 LVM to call and schedule

## 2023-12-13 ENCOUNTER — Encounter: Payer: Self-pay | Admitting: Hematology

## 2023-12-14 ENCOUNTER — Other Ambulatory Visit (INDEPENDENT_AMBULATORY_CARE_PROVIDER_SITE_OTHER)

## 2023-12-14 ENCOUNTER — Ambulatory Visit: Payer: Self-pay | Admitting: Family Medicine

## 2023-12-14 DIAGNOSIS — E119 Type 2 diabetes mellitus without complications: Secondary | ICD-10-CM

## 2023-12-14 DIAGNOSIS — E785 Hyperlipidemia, unspecified: Secondary | ICD-10-CM | POA: Diagnosis not present

## 2023-12-14 DIAGNOSIS — E1169 Type 2 diabetes mellitus with other specified complication: Secondary | ICD-10-CM | POA: Diagnosis not present

## 2023-12-14 LAB — LIPID PANEL
Cholesterol: 184 mg/dL (ref 0–200)
HDL: 56.8 mg/dL (ref >39.00)
LDL Cholesterol: 104 mg/dL — ABNORMAL HIGH (ref 0–99)
NonHDL: 127.29
Total CHOL/HDL Ratio: 3
Triglycerides: 114 mg/dL (ref 0.0–149.0)
VLDL: 22.8 mg/dL (ref 0.0–40.0)

## 2023-12-14 LAB — COMPREHENSIVE METABOLIC PANEL WITH GFR
ALT: 30 U/L (ref 0–35)
AST: 23 U/L (ref 0–37)
Albumin: 4.4 g/dL (ref 3.5–5.2)
Alkaline Phosphatase: 78 U/L (ref 39–117)
BUN: 11 mg/dL (ref 6–23)
CO2: 28 meq/L (ref 19–32)
Calcium: 10.4 mg/dL (ref 8.4–10.5)
Chloride: 102 meq/L (ref 96–112)
Creatinine, Ser: 0.97 mg/dL (ref 0.40–1.20)
GFR: 60.52 mL/min (ref >60.00)
Glucose, Bld: 91 mg/dL (ref 70–99)
Potassium: 3.5 meq/L (ref 3.5–5.1)
Sodium: 138 meq/L (ref 135–145)
Total Bilirubin: 0.6 mg/dL (ref 0.2–1.2)
Total Protein: 7.3 g/dL (ref 6.0–8.3)

## 2023-12-14 LAB — HEMOGLOBIN A1C: Hgb A1c MFr Bld: 6 % (ref 4.6–6.5)

## 2023-12-14 NOTE — Progress Notes (Signed)
 No critical labs need to be addressed urgently. We will discuss labs in detail at upcoming office visit.

## 2023-12-23 ENCOUNTER — Ambulatory Visit (INDEPENDENT_AMBULATORY_CARE_PROVIDER_SITE_OTHER): Admitting: Family Medicine

## 2023-12-23 ENCOUNTER — Encounter: Payer: Self-pay | Admitting: Family Medicine

## 2023-12-23 VITALS — BP 130/80 | HR 60 | Temp 97.7°F | Ht 58.75 in | Wt 163.4 lb

## 2023-12-23 DIAGNOSIS — E66812 Obesity, class 2: Secondary | ICD-10-CM

## 2023-12-23 DIAGNOSIS — E119 Type 2 diabetes mellitus without complications: Secondary | ICD-10-CM

## 2023-12-23 DIAGNOSIS — E1169 Type 2 diabetes mellitus with other specified complication: Secondary | ICD-10-CM

## 2023-12-23 DIAGNOSIS — Z0000 Encounter for general adult medical examination without abnormal findings: Secondary | ICD-10-CM | POA: Diagnosis not present

## 2023-12-23 DIAGNOSIS — N289 Disorder of kidney and ureter, unspecified: Secondary | ICD-10-CM | POA: Diagnosis not present

## 2023-12-23 DIAGNOSIS — Z23 Encounter for immunization: Secondary | ICD-10-CM

## 2023-12-23 DIAGNOSIS — E785 Hyperlipidemia, unspecified: Secondary | ICD-10-CM

## 2023-12-23 DIAGNOSIS — D0511 Intraductal carcinoma in situ of right breast: Secondary | ICD-10-CM | POA: Diagnosis not present

## 2023-12-23 DIAGNOSIS — E1159 Type 2 diabetes mellitus with other circulatory complications: Secondary | ICD-10-CM

## 2023-12-23 DIAGNOSIS — Z7985 Long-term (current) use of injectable non-insulin antidiabetic drugs: Secondary | ICD-10-CM | POA: Diagnosis not present

## 2023-12-23 DIAGNOSIS — I152 Hypertension secondary to endocrine disorders: Secondary | ICD-10-CM

## 2023-12-23 LAB — HM DIABETES FOOT EXAM

## 2023-12-23 NOTE — Assessment & Plan Note (Signed)
 Encouraged exercise, weight loss, healthy eating habits. ? ?

## 2023-12-23 NOTE — Progress Notes (Signed)
 Patient ID: Natalie Rice, female    DOB: 1955/09/20, 68 y.o.   MRN: [id number]  This visit was conducted in person.  BP 130/80   Pulse 60   Temp 97.7 F (36.5 C) (Temporal)   Ht 4' 10.75 (1.492 m)   Wt 163 lb 6 oz (74.1 kg)   LMP 12/26/2011   SpO2 99%   BMI 33.28 kg/m    CC:  Chief Complaint  Patient presents with   Annual Exam    Subjective:   HPI: Natalie Rice is a 68 y.o. female presenting on 12/23/2023 for Annual Exam  The patient presents for complete physical and review of chronic health problems. He/She also has the following acute concerns today:none   AMW on 10/11/2023   Breast cancer in situ s/p  lumpectomy and radiation  Onc Dr. Lanny  On anastrozole    Diabetes:  On ozempic   1 mg week. Did not have weight loss on lower dose, also no SE.  Lab Results  Component Value Date   HGBA1C 6.0 12/14/2023  Using medications without difficulties: Hypoglycemic episodes: Hyperglycemic episodes: Feet problems: non ulcers Blood Sugars averaging: not checking. eye exam within last year: yes Wt Readings from Last 3 Encounters:  12/23/23 163 lb 6 oz (74.1 kg)  11/24/23 167 lb 3.2 oz (75.8 kg)  10/11/23 170 lb (77.1 kg)  Body mass index is 33.28 kg/m.  Starting   Hypertension:   Good control on losartan  hydrochlorothiazide  50/12.5 mg 1 tablet p.o. daily BP Readings from Last 3 Encounters:  12/23/23 130/80  11/24/23 138/88  10/11/23 132/77  Using medication without problems or lightheadedness:  none Chest pain with exertion: none Edema: none Short of breath: none Average home BPs: not checking Other issues:  Elevated Cholesterol: LDL not at goal less than 899 despite change from pravastatin  to Lipitor 20 mg daily. LDL is improving.  Exercise: weights and regular exercise. Lab Results  Component Value Date   CHOL 184 12/14/2023   HDL 56.80 12/14/2023   LDLCALC 104 (H) 12/14/2023   TRIG 114.0 12/14/2023   CHOLHDL 3 12/14/2023  The 10-year ASCVD  risk score (Arnett DK, et al., 2019) is: 21.8%   Values used to calculate the score:     Age: 30 years     Clincally relevant sex: Female     Is Non-Hispanic African American: Yes     Diabetic: Yes     Tobacco smoker: No     Systolic Blood Pressure: 130 mmHg     Is BP treated: Yes     HDL Cholesterol: 56.8 mg/dL     Total Cholesterol: 184 mg/dL  Using medications without problems: Muscle aches:  Diet compliance: no meat Exercise:  regular Other complaints:  Wt Readings from Last 3 Encounters:  12/23/23 163 lb 6 oz (74.1 kg)  11/24/23 167 lb 3.2 oz (75.8 kg)  10/11/23 170 lb (77.1 kg)   Gout:    no flares in last several years. Lab Results  Component Value Date   LABURIC 7.0 05/24/2020    Relevant past medical, surgical, family and social history reviewed and updated as indicated. Interim medical history since our last visit reviewed. Allergies and medications reviewed and updated. Outpatient Medications Prior to Visit  Medication Sig Dispense Refill   anastrozole  (ARIMIDEX ) 1 MG tablet TAKE 1 TABLET BY MOUTH DAILY 100 tablet 2   Apoaequorin (PREVAGEN PO) Take 1 tablet by mouth in the morning.     atorvastatin  (LIPITOR) 20 MG  tablet TAKE 1 TABLET BY MOUTH DAILY 100 tablet 2   Cholecalciferol (VITAMIN D-3) 125 MCG (5000 UT) TABS Take 5,000 Units by mouth in the morning.     clotrimazole -betamethasone  (LOTRISONE ) cream Apply 1 Application topically daily. 30 g 0   Coenzyme Q10 (COQ10) 200 MG CAPS Take 200 mg by mouth in the morning.     Insulin Pen Needle (NOVOFINE PLUS PEN NEEDLE) 32G X 4 MM MISC Use to inject Ozempic  once a week 30 each 0   Loratadine 10 MG CAPS Take 10 mg by mouth daily as needed (seasonal allergies.).     losartan -hydrochlorothiazide  (HYZAAR) 50-12.5 MG tablet Take 1 tablet by mouth daily. 100 tablet 0   Magnesium 250 MG TABS Take 250 mg by mouth in the morning.     Omega-3 Fatty Acids (OMEGA 3 PO) Take 1 capsule by mouth daily.     Potassium 99 MG TABS  Take 99 mg by mouth in the morning.     Semaglutide , 1 MG/DOSE, 4 MG/3ML SOPN Inject 1 mg as directed once a week. 9 mL 0   No facility-administered medications prior to visit.     Per HPI unless specifically indicated in ROS section below Review of Systems  Constitutional:  Negative for fatigue and fever.  HENT:  Negative for congestion.   Eyes:  Negative for pain.  Respiratory:  Negative for cough and shortness of breath.   Cardiovascular:  Negative for chest pain, palpitations and leg swelling.  Gastrointestinal:  Negative for abdominal pain.  Genitourinary:  Negative for dysuria and vaginal bleeding.  Musculoskeletal:  Negative for back pain.  Neurological:  Negative for syncope, light-headedness and headaches.  Psychiatric/Behavioral:  Negative for dysphoric mood.    Objective:  BP 130/80   Pulse 60   Temp 97.7 F (36.5 C) (Temporal)   Ht 4' 10.75 (1.492 m)   Wt 163 lb 6 oz (74.1 kg)   LMP 12/26/2011   SpO2 99%   BMI 33.28 kg/m   Wt Readings from Last 3 Encounters:  12/23/23 163 lb 6 oz (74.1 kg)  11/24/23 167 lb 3.2 oz (75.8 kg)  10/11/23 170 lb (77.1 kg)      Physical Exam Vitals and nursing note reviewed.  Constitutional:      General: She is not in acute distress.    Appearance: Normal appearance. She is well-developed. She is not ill-appearing or toxic-appearing.  HENT:     Head: Normocephalic.     Right Ear: Hearing, tympanic membrane, ear canal and external ear normal.     Left Ear: Hearing, tympanic membrane, ear canal and external ear normal.     Nose: Nose normal.  Eyes:     General: Lids are normal. Lids are everted, no foreign bodies appreciated.     Conjunctiva/sclera: Conjunctivae normal.     Pupils: Pupils are equal, round, and reactive to light.  Neck:     Thyroid: No thyroid mass or thyromegaly.     Vascular: No carotid bruit.     Trachea: Trachea normal.  Cardiovascular:     Rate and Rhythm: Normal rate and regular rhythm.     Heart  sounds: Normal heart sounds, S1 normal and S2 normal. No murmur heard.    No gallop.  Pulmonary:     Effort: Pulmonary effort is normal. No respiratory distress.     Breath sounds: Normal breath sounds. No wheezing, rhonchi or rales.  Abdominal:     General: Bowel sounds are normal. There is no  distension or abdominal bruit.     Palpations: Abdomen is soft. There is no fluid wave or mass.     Tenderness: There is no abdominal tenderness. There is no guarding or rebound.     Hernia: No hernia is present.  Musculoskeletal:     Cervical back: Normal range of motion and neck supple.  Lymphadenopathy:     Cervical: No cervical adenopathy.  Skin:    General: Skin is warm and dry.     Findings: No rash.  Neurological:     Mental Status: She is alert.     Cranial Nerves: No cranial nerve deficit.     Sensory: No sensory deficit.  Psychiatric:        Mood and Affect: Mood is not anxious or depressed.        Speech: Speech normal.        Behavior: Behavior normal. Behavior is cooperative.        Judgment: Judgment normal.    Diabetic foot exam: Normal inspection No skin breakdown No calluses  Normal DP pulses Normal sensation to light touch and monofilament Nails normal     Results for orders placed or performed in visit on 12/23/23  HM DIABETES FOOT EXAM   Collection Time: 12/23/23 12:00 AM  Result Value Ref Range   HM Diabetic Foot Exam done      COVID 19 screen:  No recent travel or known exposure to COVID19 The patient denies respiratory symptoms of COVID 19 at this time. The importance of social distancing was discussed today.   Assessment and Plan   The patient's preventative maintenance and recommended screening tests for an annual wellness exam were reviewed in full today. Brought up to date unless services declined.  Counselled on the importance of diet, exercise, and its role in overall health and mortality. The patient's FH and SH was reviewed, including their  home life, tobacco status, and drug and alcohol status.   Vaccines:uptodate with td, and flu...  Update  with PNA, shingle, {lanning RSV and COVID vaccine Colon: 12/2021 repeat in 7 years Mammo 11/30/2023 PAP/DVE:  Nml, neg HPV 06/2013, repeat in 2020  no history of abnormals.  04/2019 normal  Bone density: Normal 06/2021, repeat in 5 years Nonsmoker  Hep C: done  HIV:  refused Due for yearly eye exam and foot exam Problem List Items Addressed This Visit     Acute renal insufficiency   Kidney function improved, insufficiency resolved with hydration and back on lower dose of losartan  hydrochlorothiazide .      Diabetes mellitus without complication (HCC) (Chronic)    Chronic, excellent control with ozempic .  She has noted continued weight loss in last few months  On ozempic  to 1 mg weekly.   Re-eval in 6 months.      Ductal carcinoma in situ (DCIS) of right breast   Current active treatment on anastrozole  1 mg p.o. daily.      Hyperlipidemia associated with type 2 diabetes mellitus (HCC) (Chronic)   Chronic, significant improvement, almost at goal on atorvastatin  20 mg p.o. daily      Hypertension associated with diabetes (HCC) (Chronic)    Stable, chronic.  Continue current medication.  Losartan  hydrochlorothiazide  50/12.5 mg p.o. daily      Obesity, Class II, BMI 35-39.9, no comorbidity (HCC) (Chronic)   Encouraged exercise, weight loss, healthy eating habits.       Other Visit Diagnoses       Routine general medical examination at a health  care facility    -  Primary     Need for influenza vaccination       Relevant Orders   Flu vaccine HIGH DOSE PF(Fluzone Trivalent) (Completed)         Greig Ring, MD

## 2023-12-23 NOTE — Assessment & Plan Note (Signed)
 Chronic, significant improvement, almost at goal on atorvastatin  20 mg p.o. daily

## 2023-12-23 NOTE — Assessment & Plan Note (Addendum)
 Kidney function improved, insufficiency resolved with hydration and back on lower dose of losartan  hydrochlorothiazide .

## 2023-12-23 NOTE — Assessment & Plan Note (Signed)
 Chronic, excellent control with ozempic .  She has noted continued weight loss in last few months  On ozempic  to 1 mg weekly.   Re-eval in 6 months.

## 2023-12-23 NOTE — Assessment & Plan Note (Signed)
Stable, chronic.  Continue current medication.   Losartan hydrochlorothiazide 50/12.5 mg p.o. daily.

## 2023-12-23 NOTE — Assessment & Plan Note (Signed)
 Current active treatment on anastrozole  1 mg p.o. daily.

## 2023-12-28 ENCOUNTER — Other Ambulatory Visit: Payer: Self-pay | Admitting: Nurse Practitioner

## 2023-12-28 ENCOUNTER — Other Ambulatory Visit: Payer: Self-pay | Admitting: Family Medicine

## 2023-12-28 DIAGNOSIS — E1159 Type 2 diabetes mellitus with other circulatory complications: Secondary | ICD-10-CM

## 2023-12-28 DIAGNOSIS — E119 Type 2 diabetes mellitus without complications: Secondary | ICD-10-CM

## 2023-12-28 MED ORDER — SEMAGLUTIDE (1 MG/DOSE) 4 MG/3ML ~~LOC~~ SOPN: 1.0000 mg | PEN_INJECTOR | SUBCUTANEOUS | 1 refills | Status: AC

## 2023-12-28 NOTE — Telephone Encounter (Signed)
 Copied from CRM #[id number]. Topic: Clinical - Medication Refill >> Dec 28, 2023  2:55 PM Rosina D wrote: Medication: losartan -hydrochlorothiazide  and ozempic  (90 day) [place]   Has the patient contacted their pharmacy? Yes (Agent: If no, request that the patient contact the pharmacy for the refill. If patient does not wish to contact the pharmacy document the reason why and proceed with request.) (Agent: If yes, when and what did the pharmacy advise?)  This is the patient's preferred pharmacy:  [facility] [place], [place] - [address] [place] [address] Phone: [phone] Fax: [phone]  Is this the correct pharmacy for this prescription? Yes If no, delete pharmacy and type the correct one.   Has the prescription been filled recently? Yes  Is the patient out of the medication? No  Has the patient been seen for an appointment in the last year OR does the patient have an upcoming appointment? Yes  Can we respond through MyChart? Yes  Agent: Please be advised that Rx refills may take up to 3 business days. We ask that you follow-up with your pharmacy.

## 2023-12-28 NOTE — Telephone Encounter (Signed)
 Requesting (90 day) Patient is going out of the country until march

## 2024-02-15 ENCOUNTER — Telehealth: Payer: Self-pay | Admitting: *Deleted

## 2024-02-15 NOTE — Telephone Encounter (Signed)
 I am assuming patient will just need to stop the Ozempic  all together until she returns back to the [place] .  Please advise.

## 2024-02-15 NOTE — Telephone Encounter (Signed)
 Copied from CRM [id number]. Topic: Clinical - Medication Question >> Feb 15, 2024  2:16 PM Mia F wrote: Reason for CRM: Pt says she is out of the country until March. She took her Semaglutide , 1 MG/DOSE, 4 MG/3ML SOPN with her and thought she had it in a safe and cool spot but realized what she used to keep it cool in was not working. She says she had to throw away all of her medication and she had to skip this past Sunday dose because she had to throw the medication away. She is asking what can she do. Please call pt regarding this matter.

## 2024-02-16 NOTE — Telephone Encounter (Signed)
 Natalie Rice notified as instructed by telephone.  She agrees she thinks it would be hard to see a doctor in [place] and get the ozempic  there.  She states her insurance wouldn't cover a refill on the Ozempic  now due to when her last refilled was so shipping it by a friend it out as well.  She will just stop the medication for now and touch base with us  to restart [place].  Please advise if patient will restart at current dose or will she need to restart at lower dose?

## 2024-02-16 NOTE — Telephone Encounter (Signed)
 Noted

## 2024-03-11 ENCOUNTER — Other Ambulatory Visit: Payer: Self-pay | Admitting: Family Medicine

## 2024-03-20 NOTE — Progress Notes (Signed)
 Natalie Rice                                          MRN: [id number]   03/20/2024   The [facility] Quality Team Specialist reviewed this patient medical record for the purposes of chart review for care gap closure. The following were reviewed: abstraction for care gap closure-glycemic status assessment.    [facility] Quality Team

## 2024-04-14 ENCOUNTER — Encounter: Payer: Self-pay | Admitting: *Deleted

## 2024-04-14 NOTE — Progress Notes (Signed)
 Laporsha L. Rogoff                                          MRN: [id number]   04/14/2024   The [facility] Quality Team Specialist reviewed this patient medical record for the purposes of chart review for care gap closure. The following were reviewed: chart review for care gap closure-glycemic status assessment.    [facility] Quality Team

## 2024-10-11 ENCOUNTER — Ambulatory Visit
